# Patient Record
Sex: Female | Born: 1955 | Race: White | Hispanic: No | Marital: Married | Smoking: Never smoker
Health system: Southern US, Community
[De-identification: ages and names within clinical notes are randomized; demographics above are authoritative.]

## PROBLEM LIST (undated history)

## (undated) DIAGNOSIS — I739 Peripheral vascular disease, unspecified: Secondary | ICD-10-CM

## (undated) DIAGNOSIS — I10 Essential (primary) hypertension: Secondary | ICD-10-CM

## (undated) DIAGNOSIS — G4733 Obstructive sleep apnea (adult) (pediatric): Secondary | ICD-10-CM

## (undated) DIAGNOSIS — C4490 Unspecified malignant neoplasm of skin, unspecified: Secondary | ICD-10-CM

## (undated) DIAGNOSIS — E669 Obesity, unspecified: Secondary | ICD-10-CM

## (undated) DIAGNOSIS — M1990 Unspecified osteoarthritis, unspecified site: Secondary | ICD-10-CM

## (undated) DIAGNOSIS — F419 Anxiety disorder, unspecified: Secondary | ICD-10-CM

## (undated) DIAGNOSIS — I499 Cardiac arrhythmia, unspecified: Secondary | ICD-10-CM

## (undated) DIAGNOSIS — I480 Paroxysmal atrial fibrillation: Secondary | ICD-10-CM

## (undated) DIAGNOSIS — K219 Gastro-esophageal reflux disease without esophagitis: Secondary | ICD-10-CM

## (undated) DIAGNOSIS — I4891 Unspecified atrial fibrillation: Secondary | ICD-10-CM

## (undated) DIAGNOSIS — D649 Anemia, unspecified: Secondary | ICD-10-CM

## (undated) DIAGNOSIS — G43909 Migraine, unspecified, not intractable, without status migrainosus: Secondary | ICD-10-CM

## (undated) DIAGNOSIS — Z9989 Dependence on other enabling machines and devices: Secondary | ICD-10-CM

## (undated) HISTORY — PX: TUBAL LIGATION: SHX77

## (undated) HISTORY — DX: Essential (primary) hypertension: I10

## (undated) HISTORY — DX: Obesity, unspecified: E66.9

## (undated) HISTORY — PX: TONSILLECTOMY AND ADENOIDECTOMY: SUR1326

## (undated) HISTORY — DX: Anxiety disorder, unspecified: F41.9

## (undated) HISTORY — DX: Unspecified atrial fibrillation: I48.91

## (undated) HISTORY — PX: JOINT REPLACEMENT: SHX530

## (undated) HISTORY — DX: Paroxysmal atrial fibrillation: I48.0

---

## 1998-05-08 ENCOUNTER — Ambulatory Visit (HOSPITAL_COMMUNITY): Admission: RE | Admit: 1998-05-08 | Discharge: 1998-05-08 | Payer: Self-pay | Admitting: Family Medicine

## 1998-05-14 ENCOUNTER — Encounter: Payer: Self-pay | Admitting: Family Medicine

## 1998-05-14 ENCOUNTER — Ambulatory Visit (HOSPITAL_COMMUNITY): Admission: RE | Admit: 1998-05-14 | Discharge: 1998-05-14 | Payer: Self-pay | Admitting: Family Medicine

## 1998-11-11 ENCOUNTER — Ambulatory Visit (HOSPITAL_COMMUNITY): Admission: RE | Admit: 1998-11-11 | Discharge: 1998-11-11 | Payer: Self-pay | Admitting: Family Medicine

## 1999-03-09 ENCOUNTER — Other Ambulatory Visit: Admission: RE | Admit: 1999-03-09 | Discharge: 1999-03-09 | Payer: Self-pay | Admitting: Family Medicine

## 2000-01-14 ENCOUNTER — Encounter: Payer: Self-pay | Admitting: Family Medicine

## 2000-01-14 ENCOUNTER — Encounter: Admission: RE | Admit: 2000-01-14 | Discharge: 2000-01-14 | Payer: Self-pay | Admitting: Family Medicine

## 2000-10-07 ENCOUNTER — Other Ambulatory Visit: Admission: RE | Admit: 2000-10-07 | Discharge: 2000-10-07 | Payer: Self-pay | Admitting: *Deleted

## 2000-10-13 ENCOUNTER — Encounter: Admission: RE | Admit: 2000-10-13 | Discharge: 2000-10-13 | Payer: Self-pay | Admitting: *Deleted

## 2000-10-13 ENCOUNTER — Encounter: Payer: Self-pay | Admitting: *Deleted

## 2003-06-29 HISTORY — PX: CARDIAC CATHETERIZATION: SHX172

## 2003-08-05 ENCOUNTER — Inpatient Hospital Stay (HOSPITAL_COMMUNITY): Admission: EM | Admit: 2003-08-05 | Discharge: 2003-08-05 | Payer: Self-pay | Admitting: *Deleted

## 2003-09-30 ENCOUNTER — Other Ambulatory Visit: Admission: RE | Admit: 2003-09-30 | Discharge: 2003-09-30 | Payer: Self-pay | Admitting: Obstetrics and Gynecology

## 2003-10-02 ENCOUNTER — Encounter: Admission: RE | Admit: 2003-10-02 | Discharge: 2003-10-02 | Payer: Self-pay | Admitting: Obstetrics and Gynecology

## 2004-11-05 ENCOUNTER — Encounter: Admission: RE | Admit: 2004-11-05 | Discharge: 2004-11-05 | Payer: Self-pay | Admitting: Obstetrics and Gynecology

## 2004-12-15 ENCOUNTER — Ambulatory Visit: Payer: Self-pay | Admitting: Internal Medicine

## 2005-01-01 ENCOUNTER — Ambulatory Visit: Payer: Self-pay | Admitting: Internal Medicine

## 2005-12-16 ENCOUNTER — Encounter: Admission: RE | Admit: 2005-12-16 | Discharge: 2005-12-16 | Payer: Self-pay | Admitting: Obstetrics and Gynecology

## 2006-07-25 ENCOUNTER — Encounter: Admission: RE | Admit: 2006-07-25 | Discharge: 2006-07-25 | Payer: Self-pay | Admitting: Obstetrics and Gynecology

## 2007-01-06 ENCOUNTER — Encounter: Admission: RE | Admit: 2007-01-06 | Discharge: 2007-01-06 | Payer: Self-pay | Admitting: Obstetrics and Gynecology

## 2007-01-17 ENCOUNTER — Encounter: Admission: RE | Admit: 2007-01-17 | Discharge: 2007-01-17 | Payer: Self-pay | Admitting: Obstetrics and Gynecology

## 2008-01-22 ENCOUNTER — Encounter: Admission: RE | Admit: 2008-01-22 | Discharge: 2008-01-22 | Payer: Self-pay | Admitting: Obstetrics and Gynecology

## 2008-07-24 ENCOUNTER — Ambulatory Visit (HOSPITAL_COMMUNITY): Admission: RE | Admit: 2008-07-24 | Discharge: 2008-07-24 | Payer: Self-pay | Admitting: Interventional Cardiology

## 2009-02-13 ENCOUNTER — Encounter: Admission: RE | Admit: 2009-02-13 | Discharge: 2009-02-13 | Payer: Self-pay | Admitting: Obstetrics and Gynecology

## 2009-04-24 ENCOUNTER — Ambulatory Visit (HOSPITAL_COMMUNITY): Admission: RE | Admit: 2009-04-24 | Discharge: 2009-04-24 | Payer: Self-pay | Admitting: Interventional Cardiology

## 2009-08-31 ENCOUNTER — Emergency Department (HOSPITAL_COMMUNITY): Admission: EM | Admit: 2009-08-31 | Discharge: 2009-08-31 | Payer: Self-pay | Admitting: Emergency Medicine

## 2010-02-16 ENCOUNTER — Encounter: Admission: RE | Admit: 2010-02-16 | Discharge: 2010-02-16 | Payer: Self-pay | Admitting: Obstetrics and Gynecology

## 2010-09-20 LAB — URINALYSIS, ROUTINE W REFLEX MICROSCOPIC
Bilirubin Urine: NEGATIVE
Glucose, UA: NEGATIVE mg/dL
Hgb urine dipstick: NEGATIVE
Ketones, ur: NEGATIVE mg/dL
Nitrite: NEGATIVE
Protein, ur: NEGATIVE mg/dL
Specific Gravity, Urine: 1.026 (ref 1.005–1.030)
Urobilinogen, UA: 1 mg/dL (ref 0.0–1.0)
pH: 6 (ref 5.0–8.0)

## 2010-09-20 LAB — CBC
HCT: 43.1 % (ref 36.0–46.0)
Hemoglobin: 14.3 g/dL (ref 12.0–15.0)
MCHC: 33.3 g/dL (ref 30.0–36.0)
MCV: 89.5 fL (ref 78.0–100.0)
Platelets: 286 K/uL (ref 150–400)
RBC: 4.81 MIL/uL (ref 3.87–5.11)
RDW: 14.7 % (ref 11.5–15.5)
WBC: 9.2 K/uL (ref 4.0–10.5)

## 2010-09-20 LAB — BASIC METABOLIC PANEL WITH GFR
BUN: 13 mg/dL (ref 6–23)
CO2: 23 meq/L (ref 19–32)
Calcium: 8.5 mg/dL (ref 8.4–10.5)
Chloride: 103 meq/L (ref 96–112)
Creatinine, Ser: 0.79 mg/dL (ref 0.4–1.2)
GFR calc non Af Amer: >60 mL/min
Glucose, Bld: 119 mg/dL — ABNORMAL HIGH (ref 70–99)
Potassium: 4.8 meq/L (ref 3.5–5.1)
Sodium: 136 meq/L (ref 135–145)

## 2010-09-20 LAB — DIFFERENTIAL
Eosinophils Absolute: 0.1 10*3/uL (ref 0.0–0.7)
Lymphocytes Relative: 16 % (ref 12–46)

## 2010-10-01 LAB — PROTIME-INR: Prothrombin Time: 25 seconds — ABNORMAL HIGH (ref 11.6–15.2)

## 2010-11-13 NOTE — Cardiovascular Report (Signed)
NAMECARMIE, Deborah [name]                           ACCOUNT NO.:  [id number]   MEDICAL RECORD NO.:  [id number]                   PATIENT TYPE:  INP   LOCATION:  2861                                 FACILITY:  [facility]   PHYSICIAN:  Veneda Melter, M.D.                   DATE OF BIRTH:  [birth year]   DATE OF PROCEDURE:  08/05/2003  DATE OF DISCHARGE:  08/05/2003                              CARDIAC CATHETERIZATION   PROCEDURES PERFORMED:  1. Left heart catheterization.  2. Left ventriculogram.  3. Selective coronary angiography.  4. Perclose right femoral artery.   DIAGNOSES:  1. Trivial coronary artery disease by angiogram.  2. Normal left ventricular size and function.   HISTORY:  Deborah [name] is a 55 year old white female without prior cardiac  history but with a strong family history of coronary disease who presents  with severe substernal chest discomfort.  The patient presented to the  hospital.  He was stabilized medically and is referred for further  assessment.   DESCRIPTION OF PROCEDURE:  Informed consent was obtained.  The patient was  brought to the catheterization lab.  A 6 French sheath was placed in the  right femoral artery using the modified Seldinger technique.  A 6 [place]  and JR4 catheter was used to engage the left coronary artery and selective  angiography was performed in various projections using manual injections of  contrast.  A 6 French pigtail catheter was then advanced to the left  ventricle and a ventriculogram performed in two views using power injections  of contrast.  At the termination of the case, catheters and sheath were  removed.  New Perclose system to employed to the right femoral artery.  Mild  oozing was noted, and further manual pressure applied until hemostasis was  achieved.  The patient tolerated the procedure well and was transferred to  the floor in stable condition.   FINDINGS:  1. Left main trunk:  Large caliber vessel which is  angiographically normal.  2. Left anterior descending is a large caliber vessel that provides a first     diagonal branch in the proximal segment and two smaller diagonal branches     in the midsection.  The LAD has luminal irregularities.  3. Left circumflex artery is a small caliber vessel that provides two     marginal branches.  The left circumflex system has luminal     irregularities.  4. Right coronary artery is dominant.  It is a large caliber vessel that     provides the posterior descending artery and posterior ventricular branch     terminal segment.  The right coronary has luminal irregularities of 10-     15% in the proximal segment.   LEFT VENTRICULOGRAM:  Normal end-systolic and end-diastolic dimensions.  Overall left ventricular function is well-preserved with an ejection  fraction of greater than 55%.  No mitral regurgitation.  The LV pressure is  130/10.  Aortic is 130/80.  LVEDP equal 20.   ASSESSMENT AND PLAN:  Deborah [name] is a 55 year old female with trivial  coronary artery disease and well preserved left ventricular function.  Continued medical therapy will be pursued.  She was then started on beta  blocker for treatment of her blood pressure and this will be followed  closely in the office and further adjustments made as necessary.  Should she  have continued discomfort, other causes of chest pain will be investigated.                                               Veneda Melter, M.D.    NG/MEDQ  D:  08/05/2003  T:  08/06/2003  Job:  [id number]   cc:   [place]. Andrey Campanile, M.D.  [address]  [place]  [place] [address]  Fax: [phone]   Rollene Rotunda, M.D.

## 2010-11-13 NOTE — H&P (Signed)
[name], [name] NO.:  [id number]   MEDICAL RECORD NO.:  [id number]                   PATIENT TYPE:  EMS   LOCATION:  MAJO                                 FACILITY:  [facility]   PHYSICIAN:  Rollene Rotunda, M.D.                DATE OF BIRTH:  1956/01/27   DATE OF ADMISSION:  08/05/2003  DATE OF DISCHARGE:                                HISTORY & PHYSICAL   REASON FOR ADMISSION:  Patient with chest pain.   HISTORY OF PRESENT ILLNESS:  The patient is a pleasant 55 year old white  female without prior cardiac history.  She was at [facility] yesterday when she  developed substernal chest pressure.  It was moderately severe in intensity.  It radiated to right side of her neck.  There was no associated diaphoresis  or nausea.  She had not had this kind of pain before.  In particularly, it  was not like previous reflux.  It lasted for approximately 45 minutes and  went away on its own.  She did feel some slight discomfort throughout the  rest of the evening.  She woke in the early a.m. with chest discomfort.  This lasted for only 10 minutes.  It was not as severe.   In retrospect, she has not had this kind of discomfort before.  She is  moderately active.  She does not exercise.  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  With this, she denies any chest discomfort, neck discomfort,  arm discomfort, activity induced nausea, vomiting, or excessive diaphoresis.  She has had no palpitations, presyncope or syncope.  She denies any PND or  orthopnea.   PAST MEDICAL HISTORY:  The patient reports no history of hyperlipidemia,  though she does not recall the readings, she has had mild hypertension  yesterday at Urgent Care and today.  She has never had diabetes.   PAST SURGICAL HISTORY:  1. Tonsillectomy.  2. Tubal ligation.   ALLERGIES:  No known drug allergies.   MEDICATIONS:   None.   SOCIAL HISTORY:  The patient is married.  She has two children ages 76 and  33.  She has never smoked cigarettes.  She rarely drinks alcohol.   FAMILY HISTORY:  This is contributory for mother dying of myocardial  infarction at age 49.  She also has a brother with questionable MI in his  55s.   REVIEW OF SYMPTOMS:  As stated in the HPI.  Positive for recent urinary  tract infection, positive for fatigue for the last week, unusual for her.   PHYSICAL EXAMINATION:  GENERAL APPEARANCE:  The patient is in no distress.  VITAL SIGNS:  Blood pressure 124/70, heart rate 77 and regular.  HEENT:  Eyelids unremarkable.  Pupils are equal, round and reactive to  light.  Fundi not visualized.  Oral  mucosa  unremarkable.  NECK:  No jugular venous distension.  Wave form within normal limits.  Carotid upstroke brisk and symmetric, no bruits, no thyromegaly.  LYMPHATICS:  No cervical, axillary, or inguinal adenopathy.  CHEST:  Unremarkable.  LUNGS:  Clear to auscultation bilaterally.  BACK:  No costovertebral angle tenderness.  CARDIOVASCULAR:  PMI not displaced or sustained.  S1 and S2 within normal  limits, no S3, no S4, no murmurs.  ABDOMEN:  Flat, positive bowel sounds, normal in frequency and pitch.  No  bruits, rebound, guarding, no midline pulsatile mass, no organomegaly.  SKIN:  No rashes, no nodules.  EXTREMITIES:  2+ pulses throughout, no edema.  NEUROLOGIC:  Oriented to person, place and time.  Cranial nerves II-XII  grossly intact.  Motor grossly intact.   EKG sinus rhythm.  Axis within normal limits.  Intervals within normal  limits.  No acute STT wave changes.   Chest x-ray pending.   Labs pending.   ASSESSMENT/PLAN:  1. Chest discomfort:  The patient's chest discomfort has some typical     features.  She does have a family history of early onset coronary     disease.  Given the resting nature of this and the recurrence, we will     assume that this represents unstable  angina.  I have discussed this at     great length with the patient and her husband.  I have discussed the     possibility of stress testing with Cardiolite versus cardiac     catheterization.  I have described in great detail the risks and benefits     of each approach.  I would suggest cardiac catheterization as I think her     pretest probability of obstructive coronary disease is moderate.  The     patient and her husband the risks and benefits and wish to proceed in     this direction.  2. Risk reduction:  Would check a fasting lipid profile.  3. Hypertension:  Blood pressure is mildly elevated.  Will treat this in the     context of any obstructive coronary disease.  It might be identified.     Otherwise it might be treated with therapeutic lifestyle changes.  She     will be followed long-term by Dr. Andrey Campanile.                                                Rollene Rotunda, M.D.    JH/MEDQ  D:  08/05/2003  T:  08/05/2003  Job:  [id number]   cc:   [place]. Andrey Campanile, M.D.  [address]  [place]  [place] [address]  Fax: [phone]

## 2010-11-13 NOTE — Discharge Summary (Signed)
NAMEBETHANN, Deborah [name] NO.:  [id number]   MEDICAL RECORD NO.:  [id number]                   PATIENT TYPE:  INP   LOCATION:  2861                                 FACILITY:  [facility]   PHYSICIAN:  Rollene Rotunda, M.D.                DATE OF BIRTH:  Jun 02, 1956   DATE OF ADMISSION:  08/05/2003  DATE OF DISCHARGE:  08/05/2003                                 DISCHARGE SUMMARY   PROCEDURES:  1. Cardiac catheterization.  2. Coronary arteriogram.  3. Left ventriculogram.   HOSPITAL COURSE:  Deborah [name] is a 55 year old female with no known history of  coronary artery disease.  She developed moderately severe substernal chest  discomfort that radiated to her neck on the right while in a store with  minimal exertion.  This lasted about 45 minutes.  She came to the emergency  room where she was admitted for further evaluation and treatment.   Deborah [name] has cardiac risk factors and it was felt that she needed further  evaluation with catheterization and this was performed on August 05, 2003.  The cardiac catheterization showed less than 20% stenosis of the RCA and no  other disease except for some minimal irregularities.  EF was greater than  55% with no MR.  Dr. Chales Abrahams evaluated the films and felt that she had trivial  coronary artery disease and medical management was the best option.  He  added a beta-blocker to better control her blood pressure and she is to  follow up with her primary care physician.   Post catheterization, she had no chest pain or shortness of breath and her  groin was stable.  She was discharged on August 05, 2003 and is to follow  up with Dr. Andrey Campanile as an outpatient.   CONDITION ON DISCHARGE:  Improved.   DISCHARGE DIAGNOSES:  1. Chest pain, no significant coronary artery disease by catheterization     with normal ejection fraction.  2. Possible history of hyperlipidemia.  3. Borderline hypertension.  4. Status post tubal ligation.  5.  Status post tonsillectomy and adenoidectomy.  6. Family history of coronary artery disease.  7. History of recent fatigue.   ACTIVITY:  No strenuous activity for the next 24 hours.   FOLLOW UP:  She is to follow up with Dr. Margrett Rud.  She is to follow  up with cardiology on a p.r.n. basis.   DISCHARGE MEDICATIONS:  Lopressor 25 mg p.o. b.i.d.      Theodore Demark, P.A. [facility]                  Rollene Rotunda, M.D.    RB/MEDQ  D:  08/05/2003  T:  08/06/2003  Job:  [id number]   cc:   [place]. Andrey Campanile, M.D.  [address]  [place]  [place] [address]  Fax: [phone]

## 2011-02-16 ENCOUNTER — Other Ambulatory Visit: Payer: Self-pay | Admitting: Obstetrics

## 2011-02-16 DIAGNOSIS — Z1231 Encounter for screening mammogram for malignant neoplasm of breast: Secondary | ICD-10-CM

## 2011-02-25 ENCOUNTER — Ambulatory Visit
Admission: RE | Admit: 2011-02-25 | Discharge: 2011-02-25 | Disposition: A | Discharge status: Home / Self Care | Payer: 59 | Source: Ambulatory Visit | Attending: Obstetrics | Admitting: Obstetrics

## 2011-02-25 DIAGNOSIS — Z1231 Encounter for screening mammogram for malignant neoplasm of breast: Secondary | ICD-10-CM

## 2011-09-24 DIAGNOSIS — H35719 Central serous chorioretinopathy, unspecified eye: Secondary | ICD-10-CM | POA: Insufficient documentation

## 2011-09-24 DIAGNOSIS — H35052 Retinal neovascularization, unspecified, left eye: Secondary | ICD-10-CM | POA: Insufficient documentation

## 2012-03-10 ENCOUNTER — Other Ambulatory Visit: Payer: Self-pay | Admitting: Obstetrics

## 2012-03-10 DIAGNOSIS — Z1231 Encounter for screening mammogram for malignant neoplasm of breast: Secondary | ICD-10-CM

## 2012-03-31 DIAGNOSIS — Z79899 Other long term (current) drug therapy: Secondary | ICD-10-CM | POA: Insufficient documentation

## 2012-03-31 DIAGNOSIS — H318 Other specified disorders of choroid: Secondary | ICD-10-CM | POA: Insufficient documentation

## 2012-04-14 ENCOUNTER — Ambulatory Visit
Admission: RE | Admit: 2012-04-14 | Discharge: 2012-04-14 | Disposition: A | Discharge status: Home / Self Care | Payer: 59 | Source: Ambulatory Visit | Attending: Obstetrics | Admitting: Obstetrics

## 2012-04-14 DIAGNOSIS — Z1231 Encounter for screening mammogram for malignant neoplasm of breast: Secondary | ICD-10-CM

## 2012-12-19 DIAGNOSIS — H3581 Retinal edema: Secondary | ICD-10-CM | POA: Insufficient documentation

## 2013-04-02 ENCOUNTER — Other Ambulatory Visit: Payer: Self-pay

## 2013-04-02 DIAGNOSIS — Z1231 Encounter for screening mammogram for malignant neoplasm of breast: Secondary | ICD-10-CM

## 2013-04-24 ENCOUNTER — Ambulatory Visit
Admission: RE | Admit: 2013-04-24 | Discharge: 2013-04-24 | Disposition: A | Discharge status: Home / Self Care | Payer: 59 | Source: Ambulatory Visit

## 2013-04-24 DIAGNOSIS — Z1231 Encounter for screening mammogram for malignant neoplasm of breast: Secondary | ICD-10-CM

## 2013-05-02 ENCOUNTER — Encounter: Payer: Self-pay | Admitting: Interventional Cardiology

## 2013-05-02 ENCOUNTER — Encounter: Payer: Self-pay | Admitting: *Deleted

## 2013-05-03 ENCOUNTER — Encounter: Payer: Self-pay | Admitting: Cardiology

## 2013-05-03 ENCOUNTER — Ambulatory Visit (INDEPENDENT_AMBULATORY_CARE_PROVIDER_SITE_OTHER): Payer: 59 | Admitting: Cardiology

## 2013-05-03 VITALS — BP 130/84 | HR 60 | Ht 74.0 in | Wt 281.0 lb

## 2013-05-03 DIAGNOSIS — I10 Essential (primary) hypertension: Secondary | ICD-10-CM | POA: Insufficient documentation

## 2013-05-03 DIAGNOSIS — I4891 Unspecified atrial fibrillation: Secondary | ICD-10-CM

## 2013-05-03 DIAGNOSIS — G4733 Obstructive sleep apnea (adult) (pediatric): Secondary | ICD-10-CM

## 2013-05-03 DIAGNOSIS — E669 Obesity, unspecified: Secondary | ICD-10-CM | POA: Insufficient documentation

## 2013-05-03 DIAGNOSIS — I480 Paroxysmal atrial fibrillation: Secondary | ICD-10-CM

## 2013-05-03 NOTE — Progress Notes (Signed)
[address] [place], [place]  [address] Phone: [phone] Fax:  [phone]  Date:  05/03/2013   ID:  Deborah [name], DOB 03-27-1956, MRN [id number]  PCP:  No primary provider on file.  Sleep Medicine: Armanda Magic, MD     History of Present Illness: Deborah [name] is a 57 y.o. female with a history of severe OSA, obesity, HTN and PAF presents today for followup.  She is doing well.  She tolerates her CPAP device well.  She denies any daytime sleepiness except  and feels refreshed when she gets up.  She tolerates her nasal mask and feels the pressure is adequate.  She denies any palpitations, dizziness or syncope.  She does not get any aerobic exercise.   Wt Readings from Last 3 Encounters:  No data found for Wt     Past Medical History  Diagnosis Date  . Obesity   . Anxiety   . OSA (obstructive sleep apnea)   . A-fib   . HTN (hypertension)   . PAF (paroxysmal atrial fibrillation)     s/p DCCV 2010    Current Outpatient Prescriptions  Medication Sig Dispense Refill  . ALPRAZolam (XANAX) 0.5 MG tablet Take 0.5 mg by mouth at bedtime as needed for anxiety.      Marland Kitchen aspirin 325 MG EC tablet Take 325 mg by mouth daily.      Marland Kitchen escitalopram (LEXAPRO) 20 MG tablet Take 20 mg by mouth daily.       . flecainide (TAMBOCOR) 100 MG tablet Take 100 mg by mouth 2 (two) times daily.      . folic acid (FOLVITE) 1 MG tablet Take 1 mg by mouth daily.       . methotrexate (RHEUMATREX) 2.5 MG tablet Take 7.5 mg by mouth 4 (four) times a week.      . metoprolol succinate (TOPROL-XL) 100 MG 24 hr tablet Take 50 mg by mouth daily. Take with or immediately following a meal.       No current facility-administered medications for this visit.    Allergies:    Allergies  Allergen Reactions  . Adhesive [Tape]   . Benazepril     cough,  . Sulfa Antibiotics     Social History:  The patient  reports that she has never smoked. She does not have any smokeless tobacco history on file.  She reports that she drinks alcohol. She reports that she does not use illicit drugs.   Family History:  The patient's family history is not on file.   ROS:  Please see the history of present illness.      All other systems reviewed and negative.   PHYSICAL EXAM: VS:  There were no vitals taken for this visit. Well nourished, well developed, in no acute distress HEENT: normal Neck: no JVD Cardiac:  normal S1, S2; RRR; no murmur Lungs:  clear to auscultation bilaterally, no wheezing, rhonchi or rales Abd: soft, nontender, no hepatomegaly Ext: no edema Skin: warm and dry Neuro:  CNs 2-12 intact, no focal abnormalities noted       ASSESSMENT AND PLAN:  1. OSA on CPAP.  Her download today showed an AHI of 3.1/hr and 96% compliance in using more than 4 hours nightly.  She will continue on current settings. 2. HTN - well controlled  - continue Metoprolol 3.  PAF with no reoccurence since DCCV  -Flecainide/metoprolol/ASA 4.  Obesity- I recommended that she start getting into a routine exercise  regimen  Followup with me in 6 months  Signed, Armanda Magic, MD 05/03/2013 2:27 PM

## 2013-05-03 NOTE — Patient Instructions (Signed)
Your physician wants you to follow-up in: 6 Months with Dr. Turner You will receive a reminder letter in the mail two months in advance. If you don't receive a letter, please call our office to schedule the follow-up appointment.  

## 2014-01-03 ENCOUNTER — Encounter: Payer: Self-pay | Admitting: Interventional Cardiology

## 2014-01-03 ENCOUNTER — Ambulatory Visit (INDEPENDENT_AMBULATORY_CARE_PROVIDER_SITE_OTHER): Payer: 59 | Admitting: Interventional Cardiology

## 2014-01-03 VITALS — BP 122/80 | HR 61 | Ht 74.0 in | Wt 292.4 lb

## 2014-01-03 DIAGNOSIS — I4891 Unspecified atrial fibrillation: Secondary | ICD-10-CM

## 2014-01-03 DIAGNOSIS — I480 Paroxysmal atrial fibrillation: Secondary | ICD-10-CM

## 2014-01-03 DIAGNOSIS — E669 Obesity, unspecified: Secondary | ICD-10-CM

## 2014-01-03 DIAGNOSIS — I10 Essential (primary) hypertension: Secondary | ICD-10-CM

## 2014-01-03 NOTE — Patient Instructions (Signed)
Your physician recommends that you continue on your current medications as directed. Please refer to the Current Medication list given to you today.  Your physician wants you to follow-up in: [place] DR. VARANASI. You will receive a reminder letter in the mail two months in advance. If you don't receive a letter, please call our office to schedule the follow-up appointment.

## 2014-01-03 NOTE — Progress Notes (Signed)
Patient ID: Deborah [name], female   DOB: 11-30-1955, 58 y.o.   MRN: [id number]    [facility], [place], [place]  [address] Phone: [phone] Fax:  [phone]  Date:  01/03/2014   ID:  Deborah [name], DOB 15-Sep-1955, MRN [id number]  PCP:  No primary provider on file.      History of Present Illness: Deborah [name] is a 58 y.o. female with PAF. Tolerating meds. Exercising more.   Denies : Chest pain.  Dizziness.  Leg edema.  Orthopnea.  Palpitations.  Syncope.    Wt Readings from Last 3 Encounters:  01/03/14 292 lb 6.4 oz (132.632 kg)  05/03/13 281 lb (127.461 kg)     Past Medical History  Diagnosis Date  . Obesity   . Anxiety   . OSA (obstructive sleep apnea)   . A-fib   . HTN (hypertension)   . PAF (paroxysmal atrial fibrillation)     s/p DCCV 2010    Current Outpatient Prescriptions  Medication Sig Dispense Refill  . ALPRAZolam (XANAX) 0.5 MG tablet Take 0.5 mg by mouth at bedtime as needed for anxiety.      Marland Kitchen aspirin 325 MG EC tablet Take 325 mg by mouth daily.      Marland Kitchen escitalopram (LEXAPRO) 20 MG tablet Take 20 mg by mouth daily.       . flecainide (TAMBOCOR) 100 MG tablet Take 100 mg by mouth 2 (two) times daily.      . folic acid (FOLVITE) 1 MG tablet Take 1 mg by mouth daily.       . methotrexate (RHEUMATREX) 2.5 MG tablet Take 7.5 mg by mouth 4 (four) times a week.      . metoprolol succinate (TOPROL-XL) 100 MG 24 hr tablet Take 50 mg by mouth daily. Take with or immediately following a meal.       No current facility-administered medications for this visit.    Allergies:    Allergies  Allergen Reactions  . Adhesive [Tape]   . Benazepril     cough,  . Sulfa Antibiotics     Social History:  The patient  reports that she has never smoked. She does not have any smokeless tobacco history on file. She reports that she drinks alcohol. She reports that she does not use illicit drugs.   Family History:  The patient's family history includes  Heart disease in her mother.   ROS:  Please see the history of present illness.  No nausea, vomiting.  No fevers, chills.  No focal weakness.  No dysuria.   All other systems reviewed and negative.   PHYSICAL EXAM: VS:  BP 122/80  Pulse 61  Ht 6\' 2"  (1.88 m)  Wt 292 lb 6.4 oz (132.632 kg)  BMI 37.53 kg/m2 Well nourished, well developed, in no acute distress HEENT: normal Neck: no JVD, no carotid bruits Cardiac:  normal S1, S2; RRR;  Lungs:  clear to auscultation bilaterally, no wheezing, rhonchi or rales Abd: soft, nontender, no hepatomegaly Ext: no edema Skin: warm and dry Neuro:   no focal abnormalities noted  EKG:     NSR, RBBB, PVC  ASSESSMENT AND PLAN:  Atrial fibrillation  Continue Flecainide Acetate Tablet, 100 MG, 1 tablet, Orally, every 12 hrs Continue Aspirin EC Tablet Delayed Release, 325 MG, 1 tablet, Orally, once a day       Notes: Maintaining NSR. Feels PVCs but nothing like AFib.    2. Hypertension, essential  Continue  Metoprolol Succinate Tablet Extended Release 24 Hour, 100 MG, 1/2 tablet, Once a day Notes: Controlled.    3. Obesity (BMI 30-39.9)  Notes: Spoke about the importance of weight loss. SHe will try to exercise and watch her diet. Her whole family was trying to lose weight.    4. Obstructive sleep apnea  Notes: Significant sleep apnea. She feels much better with CPAP.    Preventive Medicine  Adult topics discussed:  Diet: healthy diet.  Exercise: 5 days a week, at least 30 minutes of aerobic exercise.      Signed, Mina Marble, MD, [facility] 01/03/2014 4:47 PM

## 2014-01-08 ENCOUNTER — Other Ambulatory Visit: Payer: Self-pay | Admitting: *Deleted

## 2014-01-08 MED ORDER — FLECAINIDE ACETATE 100 MG PO TABS: 100.0000 mg | ORAL_TABLET | Freq: Two times a day (BID) | ORAL | Status: DC

## 2014-01-22 ENCOUNTER — Ambulatory Visit: Payer: 59 | Admitting: Interventional Cardiology

## 2014-01-25 ENCOUNTER — Ambulatory Visit (HOSPITAL_COMMUNITY)
Admission: RE | Admit: 2014-01-25 | Discharge: 2014-01-25 | Disposition: A | Discharge status: Home / Self Care | Payer: 59 | Source: Ambulatory Visit | Attending: Family Medicine | Admitting: Family Medicine

## 2014-01-25 ENCOUNTER — Other Ambulatory Visit (HOSPITAL_COMMUNITY): Payer: Self-pay | Admitting: Family Medicine

## 2014-01-25 DIAGNOSIS — M79609 Pain in unspecified limb: Secondary | ICD-10-CM

## 2014-01-25 DIAGNOSIS — I8000 Phlebitis and thrombophlebitis of superficial vessels of unspecified lower extremity: Secondary | ICD-10-CM | POA: Insufficient documentation

## 2014-01-25 DIAGNOSIS — M79605 Pain in left leg: Secondary | ICD-10-CM

## 2014-01-25 NOTE — Progress Notes (Signed)
VASCULAR LAB PRELIMINARY  PRELIMINARY  PRELIMINARY  PRELIMINARY  Left lower extremity venous duplex completed.    Preliminary report:  Left:  Superficial thrombosis noted in the lesser saphenous vein.  No evidence of deep vein thrombosis.     Ceylin Dreibelbis, RVT 01/25/2014, 3:24 PM

## 2014-04-03 ENCOUNTER — Other Ambulatory Visit: Payer: Self-pay

## 2014-04-03 DIAGNOSIS — Z1231 Encounter for screening mammogram for malignant neoplasm of breast: Secondary | ICD-10-CM

## 2014-04-18 ENCOUNTER — Other Ambulatory Visit: Payer: Self-pay

## 2014-04-18 DIAGNOSIS — G4730 Sleep apnea, unspecified: Secondary | ICD-10-CM

## 2014-04-26 ENCOUNTER — Ambulatory Visit
Admission: RE | Admit: 2014-04-26 | Discharge: 2014-04-26 | Disposition: A | Discharge status: Home / Self Care | Payer: 59 | Source: Ambulatory Visit

## 2014-04-26 ENCOUNTER — Ambulatory Visit: Payer: 59

## 2014-04-26 DIAGNOSIS — Z1231 Encounter for screening mammogram for malignant neoplasm of breast: Secondary | ICD-10-CM

## 2014-06-28 DIAGNOSIS — I739 Peripheral vascular disease, unspecified: Secondary | ICD-10-CM

## 2014-06-28 HISTORY — PX: CATARACT EXTRACTION W/ INTRAOCULAR LENS IMPLANT: SHX1309

## 2014-06-28 HISTORY — DX: Peripheral vascular disease, unspecified: I73.9

## 2014-10-24 ENCOUNTER — Encounter: Payer: Self-pay | Admitting: Internal Medicine

## 2014-11-05 DIAGNOSIS — IMO0002 Reserved for concepts with insufficient information to code with codable children: Secondary | ICD-10-CM | POA: Insufficient documentation

## 2014-12-05 ENCOUNTER — Encounter: Payer: Self-pay | Admitting: Internal Medicine

## 2014-12-17 ENCOUNTER — Other Ambulatory Visit: Payer: Self-pay | Admitting: Interventional Cardiology

## 2015-01-27 ENCOUNTER — Encounter: Payer: Self-pay | Admitting: Gastroenterology

## 2015-02-10 DIAGNOSIS — G4733 Obstructive sleep apnea (adult) (pediatric): Secondary | ICD-10-CM | POA: Insufficient documentation

## 2015-02-10 DIAGNOSIS — I480 Paroxysmal atrial fibrillation: Secondary | ICD-10-CM | POA: Insufficient documentation

## 2015-02-18 ENCOUNTER — Encounter: Payer: Self-pay | Admitting: Internal Medicine

## 2015-02-20 ENCOUNTER — Encounter: Payer: Self-pay | Admitting: Interventional Cardiology

## 2015-02-20 ENCOUNTER — Ambulatory Visit (INDEPENDENT_AMBULATORY_CARE_PROVIDER_SITE_OTHER): Payer: 59 | Admitting: Interventional Cardiology

## 2015-02-20 VITALS — BP 140/90 | HR 56 | Ht 73.0 in | Wt 297.8 lb

## 2015-02-20 DIAGNOSIS — G4733 Obstructive sleep apnea (adult) (pediatric): Secondary | ICD-10-CM

## 2015-02-20 DIAGNOSIS — I480 Paroxysmal atrial fibrillation: Secondary | ICD-10-CM | POA: Diagnosis not present

## 2015-02-20 DIAGNOSIS — I10 Essential (primary) hypertension: Secondary | ICD-10-CM | POA: Diagnosis not present

## 2015-02-20 NOTE — Progress Notes (Signed)
Patient ID: Deborah [name], female   DOB: 01/02/1956, 59 y.o.   MRN: [id number]     Cardiology Office Note   Date:  02/20/2015   ID:  Deborah [name], DOB [birth year], MRN [id number]  PCP:  Simona Huh, MD    No chief complaint on file. f/u AFib   Wt Readings from Last 3 Encounters:  02/20/15 297 lb 12.8 oz (135.081 kg)  01/03/14 292 lb 6.4 oz (132.632 kg)  05/03/13 281 lb (127.461 kg)       History of Present Illness: Deborah [name] is a 59 y.o. female  with PAF. Tolerating meds. Exercising less due to knee pain.  It resolved on its own typically.  Denies : Chest pain.  Dizziness.  Leg edema.  Orthopnea.  Palpitations.  Syncope.  "Loves" her CPAP.  Uses it even for naps.  No bleeding problems.  Past Medical History  Diagnosis Date  . Obesity   . Anxiety   . OSA (obstructive sleep apnea)   . A-fib   . HTN (hypertension)   . PAF (paroxysmal atrial fibrillation)     s/p DCCV 2010    Past Surgical History  Procedure Laterality Date  . Btl    . Tonsillectomy and adenoidectomy       Current Outpatient Prescriptions  Medication Sig Dispense Refill  . ALPRAZolam (XANAX) 0.5 MG tablet Take 0.5 mg by mouth at bedtime as needed for anxiety.    Marland Kitchen aspirin 325 MG EC tablet Take 325 mg by mouth daily.    Marland Kitchen escitalopram (LEXAPRO) 20 MG tablet Take 20 mg by mouth daily.     . flecainide (TAMBOCOR) 100 MG tablet TAKE 1 TABLET (100 MG TOTAL) BY MOUTH 2 (TWO) TIMES DAILY. 60 tablet 11  . metoprolol succinate (TOPROL-XL) 100 MG 24 hr tablet Take 50 mg by mouth daily. Take with or immediately following a meal.     No current facility-administered medications for this visit.    Allergies:   Adhesive; Benazepril; and Sulfa antibiotics    Social History:  The patient  reports that she has never smoked. She does not have any smokeless tobacco history on file. She reports that she drinks alcohol. She reports that she does not use illicit drugs.   Family History:  The patient's  family history includes Heart disease in her mother.    ROS:  Please see the history of present illness.   Otherwise, review of systems are positive for knee pain.   All other systems are reviewed and negative.    PHYSICAL EXAM: VS:  BP 140/90 mmHg  Pulse 56  Ht 6\' 1"  (1.854 m)  Wt 297 lb 12.8 oz (135.081 kg)  BMI 39.30 kg/m2 , BMI Body mass index is 39.3 kg/(m^2). GEN: Well nourished, well developed, in no acute distress HEENT: normal Neck: no JVD, carotid bruits, or masses Cardiac: RRR; no murmurs, rubs, or gallops,no edema  Respiratory:  clear to auscultation bilaterally, normal work of breathing GI: soft, nontender, nondistended, + BS MS: no deformity or atrophy Skin: warm and dry, no rash Neuro:  Strength and sensation are intact Psych: euthymic mood, full affect   EKG:   The ekg ordered today demonstrates sinus bradycardia, RBBB   Recent Labs: No results found for requested labs within last 365 days.   Lipid Panel No results found for: CHOL, TRIG, HDL, CHOLHDL, VLDL, LDLCALC, LDLDIRECT   Other studies Reviewed: Additional studies/ records that were reviewed today with results demonstrating: 2015 ECG with  RBBB, NSR.  EF 50-55% on 2009 echo.    ASSESSMENT AND PLAN:  AFib:  Continue Flecainide Acetate Tablet, 100 MG, 1 tablet, Orally, every 12 hrs Continue Aspirin EC Tablet Delayed Release, 325 MG, 1 tablet, Orally, once a day       Notes: Maintaining NSR. No sx of PVCs.  No sx of AFib.  No sx of bradycardia. I asked her to watch out for lightheadedness or syncope. If she had either of these, could decrease her beta blocker dose.           2. HTN: Continue Metoprolol Succinate Tablet Extended Release 24 Hour, 100 MG, 1/2 tablet, Once a day Notes: Controlled.   3. Obesity: Has not lost weight.  I encouraged exercise that does not bother her knee.  4.OSA: Continue using CPAP.  Treatment of OSA can help rpevent AFib recurrence.   Current medicines are  reviewed at length with the patient today.  The patient concerns regarding her medicines were addressed.  The following changes have been made:  No change  Labs/ tests ordered today include: none  No orders of the defined types were placed in this encounter.    Recommend 150 minutes/week of aerobic exercise Low fat, low carb, high fiber diet recommended  Disposition:   FU in 1 year   Teresita Madura., MD  02/20/2015 8:05 AM    [facility] [place], [place], [place]  [address] Phone: [phone]; Fax: [phone]

## 2015-02-20 NOTE — Patient Instructions (Signed)
Medication Instructions:  Same-no change  Labwork: None  Testing/Procedures: None  Follow-Up: Your physician wants you to follow-up in: 1 year. You will receive a reminder letter in the mail two months in advance. If you don't receive a letter, please call our office to schedule the follow-up appointment.      

## 2015-02-28 DIAGNOSIS — Z961 Presence of intraocular lens: Secondary | ICD-10-CM | POA: Insufficient documentation

## 2015-04-09 ENCOUNTER — Encounter: Payer: Self-pay | Admitting: Gastroenterology

## 2015-05-27 ENCOUNTER — Other Ambulatory Visit: Payer: Self-pay

## 2015-05-27 DIAGNOSIS — Z1231 Encounter for screening mammogram for malignant neoplasm of breast: Secondary | ICD-10-CM

## 2015-06-03 DIAGNOSIS — H2511 Age-related nuclear cataract, right eye: Secondary | ICD-10-CM | POA: Insufficient documentation

## 2015-07-14 ENCOUNTER — Ambulatory Visit
Admission: RE | Admit: 2015-07-14 | Discharge: 2015-07-14 | Disposition: A | Discharge status: Home / Self Care | Payer: 59 | Source: Ambulatory Visit

## 2015-07-14 DIAGNOSIS — Z1231 Encounter for screening mammogram for malignant neoplasm of breast: Secondary | ICD-10-CM

## 2015-07-15 ENCOUNTER — Encounter: Payer: Self-pay | Admitting: Internal Medicine

## 2015-07-15 ENCOUNTER — Telehealth: Payer: Self-pay | Admitting: Internal Medicine

## 2015-07-15 NOTE — Telephone Encounter (Signed)
Left a message for patient to call back. 

## 2015-07-16 NOTE — Telephone Encounter (Signed)
Patient was already scheduled for recall colon with Dr. Olevia Perches. She had rescheduled to Dr. Havery Moros but she does not care who does it if she can have it done on April

## 2015-07-16 NOTE — Telephone Encounter (Signed)
Thank you. I will call patient as soon as our April schedule opens up.

## 2015-07-30 NOTE — Telephone Encounter (Signed)
Left message for patient to return my call to schedule colon

## 2015-08-01 ENCOUNTER — Encounter: Payer: Self-pay | Admitting: Gastroenterology

## 2015-09-24 ENCOUNTER — Ambulatory Visit (AMBULATORY_SURGERY_CENTER): Payer: Self-pay | Admitting: *Deleted

## 2015-09-24 VITALS — Ht 74.0 in | Wt 292.0 lb

## 2015-09-24 DIAGNOSIS — Z1211 Encounter for screening for malignant neoplasm of colon: Secondary | ICD-10-CM

## 2015-09-24 NOTE — Progress Notes (Signed)
Denies allergies to eggs or soy products. Denies complications with sedation or anesthesia. Denies O2 use. Denies use of diet or weight loss medications.  Emmi instructions given for colonoscopy.  

## 2015-10-08 ENCOUNTER — Encounter: Payer: Self-pay | Admitting: Gastroenterology

## 2015-10-08 ENCOUNTER — Ambulatory Visit (AMBULATORY_SURGERY_CENTER): Payer: 59 | Admitting: Gastroenterology

## 2015-10-08 VITALS — BP 118/72 | HR 63 | Temp 95.7°F | Resp 16 | Ht 74.0 in | Wt 292.0 lb

## 2015-10-08 DIAGNOSIS — Z1211 Encounter for screening for malignant neoplasm of colon: Secondary | ICD-10-CM | POA: Diagnosis present

## 2015-10-08 MED ORDER — SODIUM CHLORIDE 0.9 % IV SOLN: 500.0000 mL | INTRAVENOUS | Status: DC

## 2015-10-08 NOTE — Patient Instructions (Signed)
Impressions/recommendations:  Hemorrhoids (handout given) Repeat colonoscopy in 10 years.  YOU HAD AN ENDOSCOPIC PROCEDURE TODAY AT THE [facility]:   Refer to the procedure report that was given to you for any specific questions about what was found during the examination.  If the procedure report does not answer your questions, please call your gastroenterologist to clarify.  If you requested that your care partner not be given the details of your procedure findings, then the procedure report has been included in a sealed envelope for you to review at your convenience later.  YOU SHOULD EXPECT: Some feelings of bloating in the abdomen. Passage of more gas than usual.  Walking can help get rid of the air that was put into your GI tract during the procedure and reduce the bloating. If you had a lower endoscopy (such as a colonoscopy or flexible sigmoidoscopy) you may notice spotting of blood in your stool or on the toilet paper. If you underwent a bowel prep for your procedure, you may not have a normal bowel movement for a few days.  Please Note:  You might notice some irritation and congestion in your nose or some drainage.  This is from the oxygen used during your procedure.  There is no need for concern and it should clear up in a day or so.  SYMPTOMS TO REPORT IMMEDIATELY:   Following lower endoscopy (colonoscopy or flexible sigmoidoscopy):  Excessive amounts of blood in the stool  Significant tenderness or worsening of abdominal pains  Swelling of the abdomen that is new, acute  Fever of 100F or higher  For urgent or emergent issues, a gastroenterologist can be reached at any hour by calling [phone].   DIET: Your first meal following the procedure should be a small meal and then it is ok to progress to your normal diet. Heavy or fried foods are harder to digest and may make you feel nauseous or bloated.  Likewise, meals heavy in dairy and vegetables can increase  bloating.  Drink plenty of fluids but you should avoid alcoholic beverages for 24 hours.  ACTIVITY:  You should plan to take it easy for the rest of today and you should NOT DRIVE or use heavy machinery until tomorrow (because of the sedation medicines used during the test).    FOLLOW UP: Our staff will call the number listed on your records the next business day following your procedure to check on you and address any questions or concerns that you may have regarding the information given to you following your procedure. If we do not reach you, we will leave a message.  However, if you are feeling well and you are not experiencing any problems, there is no need to return our call.  We will assume that you have returned to your regular daily activities without incident.  If any biopsies were taken you will be contacted by phone or by letter within the next 1-3 weeks.  Please call us at [phone] if you have not heard about the biopsies in 3 weeks.    SIGNATURES/CONFIDENTIALITY: You and/or your care partner have signed paperwork which will be entered into your electronic medical record.  These signatures attest to the fact that that the information above on your After Visit Summary has been reviewed and is understood.  Full responsibility of the confidentiality of this discharge information lies with you and/or your care-partner. 

## 2015-10-08 NOTE — Progress Notes (Signed)
A/ox3, pleased with MAC, report to RN 

## 2015-10-08 NOTE — Op Note (Signed)
[facility] Patient Name: Deborah [name] Procedure Date: 10/08/2015 10:37 AM MRN: [id number] Endoscopist: [place]. Danis MD, MD Age: 60 Date of Birth: September 11, 1955 Gender: Female Procedure:                Colonoscopy Indications:              Screening for colorectal malignant neoplasm Medicines:                Monitored Anesthesia Care Procedure:                Pre-Anesthesia Assessment:                           - Prior to the procedure, a History and Physical                            was performed, and patient medications and                            allergies were reviewed. The patient's tolerance of                            previous anesthesia was also reviewed. The risks                            and benefits of the procedure and the sedation                            options and risks were discussed with the patient.                            All questions were answered, and informed consent                            was obtained. Prior Anticoagulants: The patient has                            taken no previous anticoagulant or antiplatelet                            agents. ASA Grade Assessment: II - A patient with                            mild systemic disease. After reviewing the risks                            and benefits, the patient was deemed in                            satisfactory condition to undergo the procedure.                           After obtaining informed consent, the colonoscope  was passed under direct vision. Throughout the                            procedure, the patient's blood pressure, pulse, and                            oxygen saturations were monitored continuously. The                            Model CF-HQ190L [id number]) scope was introduced                            through the anus and advanced to the the cecum,                            identified by appendiceal orifice and ileocecal                       valve. The colonoscopy was performed without                            difficulty. The patient tolerated the procedure                            well. The quality of the bowel preparation was                            good. The ileocecal valve, appendiceal orifice, and                            rectum were photographed. The quality of the bowel                            preparation was evaluated using the BBPS [facility]                            Bowel Preparation Scale) with scores of: Right                            Colon = 2, Transverse Colon = 2 and Left Colon = 2.                            The total BBPS score equals 6. The bowel                            preparation used was Miralax. Scope In: 10:56:42 AM Scope Out: 11:09:05 AM Scope Withdrawal Time: 0 hours 8 minutes 6 seconds  Total Procedure Duration: 0 hours 12 minutes 23 seconds  Findings:                 The perianal and digital rectal examinations were                            normal.  Internal hemorrhoids were found during                            retroflexion. The hemorrhoids were Grade I                            (internal hemorrhoids that do not prolapse).                           The exam was otherwise without abnormality. Complications:            No immediate complications. Estimated Blood Loss:     Estimated blood loss: none. Impression:               - Internal hemorrhoids.                           - The examination was otherwise normal.                           - No specimens collected. Recommendation:           - Patient has a contact number available for                            emergencies. The signs and symptoms of potential                            delayed complications were discussed with the                            patient. Return to normal activities tomorrow.                            Written discharge instructions were provided to the                             patient.                           - Resume previous diet.                           - Continue present medications.                           - Repeat colonoscopy in 10 years for screening                            purposes. Henry L. Danis MD, MD 10/08/2015 11:15:09 AM This report has been signed electronically.

## 2015-10-09 ENCOUNTER — Telehealth: Payer: Self-pay | Admitting: *Deleted

## 2015-10-09 NOTE — Telephone Encounter (Signed)
Name identifier, left message, follow-up 

## 2015-10-13 ENCOUNTER — Telehealth: Payer: Self-pay

## 2015-10-13 NOTE — Telephone Encounter (Signed)
**Note De-Identified Kagan Hietpas Obfuscation** Jettie Booze, MD  [place], LPN            She needs a visit with an APP or me and ECG in the next week.   JV      The pt has been scheduled to see Dr Irish Lack at 10:00 on 10/14/15. She is in agreement with date and time of f/u.

## 2015-10-14 ENCOUNTER — Ambulatory Visit (INDEPENDENT_AMBULATORY_CARE_PROVIDER_SITE_OTHER): Payer: 59 | Admitting: Interventional Cardiology

## 2015-10-14 ENCOUNTER — Encounter: Payer: Self-pay | Admitting: Interventional Cardiology

## 2015-10-14 VITALS — BP 114/82 | HR 67 | Ht 73.0 in | Wt 293.1 lb

## 2015-10-14 DIAGNOSIS — E669 Obesity, unspecified: Secondary | ICD-10-CM | POA: Diagnosis not present

## 2015-10-14 DIAGNOSIS — I10 Essential (primary) hypertension: Secondary | ICD-10-CM

## 2015-10-14 DIAGNOSIS — G4733 Obstructive sleep apnea (adult) (pediatric): Secondary | ICD-10-CM

## 2015-10-14 DIAGNOSIS — I480 Paroxysmal atrial fibrillation: Secondary | ICD-10-CM

## 2015-10-14 MED ORDER — APIXABAN 5 MG PO TABS: 5.0000 mg | ORAL_TABLET | Freq: Two times a day (BID) | ORAL | Status: DC

## 2015-10-14 NOTE — Progress Notes (Signed)
Patient ID: Deborah [name], female   DOB: Aug 20, 1955, 60 y.o.   MRN: [id number]     Cardiology Office Note   Date:  10/14/2015   ID:  Deborah [name], DOB [birth year], MRN [id number]  PCP:  Simona Huh, MD    Chief Complaint  Patient presents with  . Follow-up    tired & flutter in chest  . Atrial Fibrillation     Wt Readings from Last 3 Encounters:  10/14/15 293 lb 1.9 oz (132.958 kg)  10/08/15 292 lb (132.45 kg)  09/24/15 292 lb (132.45 kg)       History of Present Illness: Deborah [name] is a 60 y.o. female  With a h/o PAF.  She has been maintained on Flecainide,  And aspirin for years.  She has been feeling tired over the past few weeks.  SHe had a colonoscopy a week ago and it was noted that she was in atrial fibrillation a that time, but rate controlled.  THe colonoscopy was essentially negative.  She needs another one in 10 years.   Other than the fatigue, she has felt well.  When she is in NSR, She has  Felt like her energy level was normal.   Walks with a cane due to knee arthritis.  Getting physical therapy.      Past Medical History  Diagnosis Date  . Obesity   . Anxiety   . OSA (obstructive sleep apnea)   . A-fib ([facility])   . HTN (hypertension)   . PAF (paroxysmal atrial fibrillation) ([place])     s/p DCCV 2010    Past Surgical History  Procedure Laterality Date  . Btl    . Tonsillectomy and adenoidectomy       Current Outpatient Prescriptions  Medication Sig Dispense Refill  . ALPRAZolam (XANAX) 0.5 MG tablet Take 0.5 mg by mouth at bedtime as needed for anxiety. Reported on 10/08/2015    . aspirin 325 MG EC tablet Take 325 mg by mouth daily.    Marland Kitchen escitalopram (LEXAPRO) 10 MG tablet Take 10 mg by mouth daily.  3  . flecainide (TAMBOCOR) 100 MG tablet TAKE 1 TABLET (100 MG TOTAL) BY MOUTH 2 (TWO) TIMES DAILY. 60 tablet 11  . metoprolol succinate (TOPROL-XL) 100 MG 24 hr tablet Take 50 mg by mouth daily. Take with or immediately following a meal.    .  traMADol (ULTRAM) 50 MG tablet Take 50 mg by mouth every 6 (six) hours as needed for moderate pain or severe pain. Reported on 10/08/2015     No current facility-administered medications for this visit.    Allergies:   Benazepril; Adhesive; and Sulfa antibiotics    Social History:  The patient  reports that she has never smoked. She has never used smokeless tobacco. She reports that she drinks alcohol. She reports that she does not use illicit drugs.   Family History:  The patient's family history includes Heart disease in her mother; Stomach cancer in her paternal grandfather. There is no history of Colon cancer.    ROS:  Please see the history of present illness.   Otherwise, review of systems are positive for fatigue.   All other systems are reviewed and negative.    PHYSICAL EXAM: VS:  BP 114/82 mmHg  Pulse 67  Ht 6\' 1"  (1.854 m)  Wt 293 lb 1.9 oz (132.958 kg)  BMI 38.68 kg/m2  SpO2 95% , BMI Body mass index is 38.68 kg/(m^2). GEN: Well nourished, well developed, in  no acute distress HEENT: normal Neck: no JVD, carotid bruits, or masses Cardiac: irregularly irregular; no murmurs, rubs, or gallops,no edema  Respiratory:  clear to auscultation bilaterally, normal work of breathing GI: soft, nontender, nondistended, + BS MS: no deformity or atrophy Skin: warm and dry, no rash Neuro:  Strength and sensation are intact Psych: euthymic mood, full affect   EKG:   The ekg ordered today demonstrates atrial flutter with controlled ventricular response   Recent Labs: No results found for requested labs within last 365 days.   Lipid Panel No results found for: CHOL, TRIG, HDL, CHOLHDL, VLDL, LDLCALC, LDLDIRECT   Other studies Reviewed: Additional studies/ records that were reviewed today with results demonstrating: .   ASSESSMENT AND PLAN:  1. AFib/ Aflutter: Today's ECG was reviewed with EP.  It shows atrial flutter with controlled rate.  She has had atrial fibrillation in  the past. Refer to EP for possible AFlutter/AFib ablation.  Cannot increase flecainide dose due to wide QRS.  Stop aspirin and start Eliquis 5 mg BID.   2. HTN: Controled. Continue current meds.  3. OSA: Continue CPAP.  4. Obesity: Weight loss limited by chronic knee problems.  This patients CHA2DS2-VASc Score and unadjusted Ischemic Stroke Rate (% per year) is equal to 2.2 % stroke rate/year from a score of 2  Above score calculated as 1 point each if present [CHF, HTN, DM, Vascular=MI/PAD/Aortic Plaque, Age if 65-74, or Female] Above score calculated as 2 points each if present [Age > 75, or Stroke/TIA/TE]      Current medicines are reviewed at length with the patient today.  The patient concerns regarding her medicines were addressed.  The following changes have been made:  No change  Labs/ tests ordered today include:  No orders of the defined types were placed in this encounter.    Recommend 150 minutes/week of aerobic exercise Low fat, low carb, high fiber diet recommended  Disposition:   FU with EP   Teresita Madura., MD  10/14/2015 10:18 AM    [facility] [place], [place], [place]  [address] Phone: [phone]; Fax: [phone]

## 2015-10-14 NOTE — Patient Instructions (Addendum)
**Note De-Identified Deborah [name] Obfuscation** Medication Instructions:  Stop taking Aspirin and start taking Eliquis 5 mg twice daily. All of your other medications remain the same.  Labwork: None  Testing/Procedures: None  Follow-Up: Your physician recommends that you schedule a follow-up appointment with one of our EP cardiologist due to your Atrial Fibrillation and Atrial Flutter to discuss Ablation.   Special Instructions: Per Dr Irish Lack you may resume Physical Therapy      If you need a refill on your cardiac medications before your next appointment, please call your pharmacy.

## 2015-10-16 ENCOUNTER — Ambulatory Visit (INDEPENDENT_AMBULATORY_CARE_PROVIDER_SITE_OTHER): Payer: 59 | Admitting: Cardiology

## 2015-10-16 ENCOUNTER — Encounter: Payer: Self-pay | Admitting: *Deleted

## 2015-10-16 ENCOUNTER — Encounter: Payer: Self-pay | Admitting: Cardiology

## 2015-10-16 VITALS — BP 128/88 | HR 104 | Ht 73.0 in | Wt 288.8 lb

## 2015-10-16 DIAGNOSIS — I4819 Other persistent atrial fibrillation: Secondary | ICD-10-CM

## 2015-10-16 DIAGNOSIS — I481 Persistent atrial fibrillation: Secondary | ICD-10-CM

## 2015-10-16 DIAGNOSIS — Z01812 Encounter for preprocedural laboratory examination: Secondary | ICD-10-CM

## 2015-10-16 NOTE — Progress Notes (Signed)
Electrophysiology Office Note   Date:  10/16/2015   ID:  Deborah [name], DOB [birth year], MRN [id number]  PCP:  Simona Huh, MD  Cardiologist:  Irish Lack Primary Electrophysiologist:  Marvine Encalade Meredith Leeds, MD    Chief Complaint  Patient presents with  . New Patient (Initial Visit)    discuss afib/flutter     History of Present Illness: Deborah [name] is a 60 y.o. female who presents today for electrophysiology evaluation.   She has a history of paroxysmal atrial fibrillation.  She has been maintained on flecainide.  She has been tired for the last few weeks and was noted to be in atrial fibrillation, rate controlled, when she showed up for her colonoscopy.  Other than fatigue, she feels well.  When in sinus rhythm, she has no complaints of fatigue.She says that her main symptoms are due to palpitations. She does also have symptoms of shortness of breath and fatigue. She says that she has had cardioversions in the past done twice and has been in sinus rhythm after those for quite a while.   Today, she denies symptoms of chest pain, orthopnea, PND, lower extremity edema, claudication, dizziness, presyncope, syncope, bleeding, or neurologic sequela. The patient is tolerating medications without difficulties and is otherwise without complaint today.    Past Medical History  Diagnosis Date  . Obesity   . Anxiety   . OSA (obstructive sleep apnea)   . A-fib ([place])   . HTN (hypertension)   . PAF (paroxysmal atrial fibrillation) ([facility])     s/p DCCV 2010   Past Surgical History  Procedure Laterality Date  . Btl    . Tonsillectomy and adenoidectomy       Current Outpatient Prescriptions  Medication Sig Dispense Refill  . ALPRAZolam (XANAX) 0.5 MG tablet Take 0.5 mg by mouth at bedtime as needed for anxiety. Reported on 10/08/2015    . apixaban (ELIQUIS) 5 MG TABS tablet Take 1 tablet (5 mg total) by mouth 2 (two) times daily. 60 tablet 6  . escitalopram (LEXAPRO) 10 MG tablet Take 10  mg by mouth daily.  3  . flecainide (TAMBOCOR) 100 MG tablet TAKE 1 TABLET (100 MG TOTAL) BY MOUTH 2 (TWO) TIMES DAILY. 60 tablet 11  . metoprolol succinate (TOPROL-XL) 100 MG 24 hr tablet Take 50 mg by mouth daily. Take with or immediately following a meal.     No current facility-administered medications for this visit.    Allergies:   Benazepril; Adhesive; and Sulfa antibiotics   Social History:  The patient  reports that she has never smoked. She has never used smokeless tobacco. She reports that she drinks alcohol. She reports that she does not use illicit drugs.   Family History:  The patient's family history includes Heart disease in her mother; Stomach cancer in her paternal grandfather. There is no history of Colon cancer.    ROS:  Please see the history of present illness.   Otherwise, review of systems is positive for none.   All other systems are reviewed and negative.    PHYSICAL EXAM: VS:  Ht 6\' 1"  (1.854 m)  Wt 288 lb 12.8 oz (130.999 kg)  BMI 38.11 kg/m2 , BMI Body mass index is 38.11 kg/(m^2). GEN: Well nourished, well developed, in no acute distress HEENT: normal Neck: no JVD, carotid bruits, or masses Cardiac: tachycardic, irregular; no murmurs, rubs, or gallops,no edema  Respiratory:  clear to auscultation bilaterally, normal work of breathing GI: soft, nontender, nondistended, + BS  MS: no deformity or atrophy Skin: warm and dry Neuro:  Strength and sensation are intact Psych: euthymic mood, full affect  EKG:  EKG is ordered today. The ekg ordered today shows atrial fibrillation, rate 104, aberrant complexes, RBBB  Recent Labs: No results found for requested labs within last 365 days.    Lipid Panel  No results found for: CHOL, TRIG, HDL, CHOLHDL, VLDL, LDLCALC, LDLDIRECT   Wt Readings from Last 3 Encounters:  10/16/15 288 lb 12.8 oz (130.999 kg)  10/14/15 293 lb 1.9 oz (132.958 kg)  10/08/15 292 lb (132.45 kg)      Other studies  Reviewed: Additional studies/ records that were reviewed today include: Epic notes   ASSESSMENT AND PLAN:  1.  Persistent atrial fibrillation: Patient presents today in atrial fibrillation. She also has a history of atrial flutter. She had been maintained on flecainide, but this apparently has stopped working to this point. She was recently placed on anticoagulation with apixaban, and she is tolerating this well. I have discussed with her the options of continued medical management versus ablation for atrial fibrillation. It does not appear that amiodarone is a good drug choice, as she is young and would expect side effects down the road. She does not like the idea being admitted for 3 days for T consent. She is interested in ablation. I discussed with her the risks and benefits of ablation. Risks include bleeding, tamponade, heart block, stroke, and damage to surrounding organs such as the esophagus. The patient understands these risks and feels that this is the best option for her. She has not had an echocardiogram since 2009, and therefore we Jamilya Sarrazin schedule an echo. We Eagan Shifflett schedule her for an ablation. She was placed on anticoagulation one week ago, therefore we would need to wait 2 weeks for ablation to be scheduled.   This patients CHA2DS2-VASc Score and unadjusted Ischemic Stroke Rate (% per year) is equal to 2.2 % stroke rate/year from a score of 2  Above score calculated as 1 point each if present [CHF, HTN, DM, Vascular=MI/PAD/Aortic Plaque, Age if 65-74, or Female] Above score calculated as 2 points each if present [Age > 75, or Stroke/TIA/TE]       Current medicines are reviewed at length with the patient today.   The patient does not have concerns regarding her medicines.  The following changes were made today:  none  Labs/ tests ordered today include:  No orders of the defined types were placed in this encounter.     Disposition:   FU with Sherrice Creekmore post  ablation  Signed, Tarin Navarez Meredith Leeds, MD  10/16/2015 2:27 PM     [facility] [address] [place] [address] [phone] (office) [phone] (fax)

## 2015-10-16 NOTE — Patient Instructions (Addendum)
Medication Instructions:  Your physician recommends that you continue on your current medications as directed. Please refer to the Current Medication list given to you today.  Labwork: Your physician recommends that you return for lab work within 7-10 days of procedure scheduled for 12/04/2015  Testing/Procedures: Your physician has requested that you have an echocardiogram. Echocardiography is a painless test that uses sound waves to create images of your heart. It provides your doctor with information about the size and shape of your heart and how well your heart's chambers and valves are working. This procedure takes approximately one hour. There are no restrictions for this procedure.  Your physician has requested that you have cardiac CT. Cardiac computed tomography (CT) is a painless test that uses an x-ray machine to take clear, detailed pictures of your heart. For further information please visit [email or web address].    Office will contact you to schedule this test after we have received prior authorization from you insurance company.  Your physician has recommended that you have an AFib ablation. Catheter ablation is a medical procedure used to treat some cardiac arrhythmias (irregular heartbeats). During catheter ablation, a long, thin, flexible tube is put into a blood vessel in your groin (upper thigh), or neck. This tube is called an ablation catheter. It is then guided to your heart through the blood vessel. Radio frequency waves destroy small areas of heart tissue where abnormal heartbeats may cause an arrhythmia to start. Please see the instruction sheet given to you today.  Follow-Up: Your physician recommends that you schedule a follow-up appointment in: within 10-14 days of procedure on 6/8, with Dr. Camnitz/Amber/Renee.  (this is for H&P and labs)  Your physician recommends that you schedule a follow-up appointment in: 4 weeks, after procedure on 12/04/15, with Roderic Palau, NP in  the [facility].  Your physician recommends that you schedule a follow-up appointment in: 3 months, after procedure on 12/04/2015, with Dr. Curt Bears.  If you need a refill on your cardiac medications before your next appointment, please call your pharmacy.  Thank you for choosing [facility]!!   Trinidad Curet, RN [phone]    Any Other Special Instructions Will Be Listed Below (If Applicable). Cardiac Ablation Cardiac ablation is a procedure to disable a small amount of heart tissue in very specific places. The heart has many electrical connections. Sometimes these connections are abnormal and can cause the heart to beat very fast or irregularly. By disabling some of the problem areas, heart rhythm can be improved or made normal. Ablation is done for people who:   Have Wolff-Parkinson-White syndrome.   Have other fast heart rhythms (tachycardia).   Have taken medicines for an abnormal heart rhythm (arrhythmia) that resulted in:   No success.   Side effects.   May have a high-risk heartbeat that could result in death.  LET [facility] CARE PROVIDER KNOW ABOUT:   Any allergies you have or any previous reactions you have had to X-ray dye, food (such as seafood), medicine, or tape.   All medicines you are taking, including vitamins, herbs, eye drops, creams, and over-the-counter medicines.   Previous problems you or members of your family have had with the use of anesthetics.   Any blood disorders you have.   Previous surgeries or procedures (such as a kidney transplant) you have had.   Medical conditions you have (such as kidney failure).  RISKS AND COMPLICATIONS Generally, cardiac ablation is a safe procedure. However, problems can occur and include:  Increased risk of cancer. Depending on how long it takes to do the ablation, the dose of radiation can be high.  Bruising and bleeding where a thin, flexible tube (catheter) was inserted during the  procedure.   Bleeding into the chest, especially into the sac that surrounds the heart (serious).  Need for a permanent pacemaker if the normal electrical system is damaged.   The procedure may not be fully effective, and this may not be recognized for months. Repeat ablation procedures are sometimes required. BEFORE THE PROCEDURE   Follow any instructions from your health care provider regarding eating and drinking before the procedure.   Take your medicines as directed at regular times with water, unless instructed otherwise by your health care provider. If you are taking diabetes medicine, including insulin, ask how you are to take it and if there are any special instructions you should follow. It is common to adjust insulin dosing the day of the ablation.  PROCEDURE  An ablation is usually performed in a catheterization laboratory with the guidance of fluoroscopy. Fluoroscopy is a type of X-ray that helps your health care provider see images of your heart during the procedure.   An ablation is a minimally invasive procedure. This means a small cut (incision) is made in either your neck or groin. Your health care provider will decide where to make the incision based on your medical history and physical exam.  An IV tube will be started before the procedure begins. You will be given an anesthetic or medicine to help you relax (sedative).  The skin on your neck or groin will be numbed. A needle will be inserted into a large vein in your neck or groin and catheters will be threaded to your heart.  A special dye that shows up on fluoroscopy pictures may be injected through the catheter. The dye helps your health care provider see the area of the heart that needs treatment.  The catheter has electrodes on the tip. When the area of heart tissue that is causing the arrhythmia is found, the catheter tip will send an electrical current to the area and "scar" the tissue. Three types of energy  can be used to ablate the heart tissue:   Heat (radiofrequency energy).   Laser energy.   Extreme cold (cryoablation).   When the area of the heart has been ablated, the catheter will be taken out. Pressure will be held on the insertion site. This will help the insertion site clot and keep it from bleeding. A bandage will be placed on the insertion site.  AFTER THE PROCEDURE   After the procedure, you will be taken to a recovery area where your vital signs (blood pressure, heart rate, and breathing) will be monitored. The insertion site will also be monitored for bleeding.   You will need to lie still for 4-6 hours. This is to ensure you do not bleed from the catheter insertion site.    This information is not intended to replace advice given to you by your health care provider. Make sure you discuss any questions you have with your health care provider.   Document Released: 10/31/2008 Document Revised: 07/05/2014 Document Reviewed: 11/06/2012 Elsevier Interactive Patient Education [employer].

## 2015-10-17 ENCOUNTER — Telehealth: Payer: Self-pay | Admitting: *Deleted

## 2015-10-17 NOTE — Telephone Encounter (Signed)
Called patient back to add that we cancelled the 5/8 appt w/ PA. Explained that was an necessary appt. Patient agreeable and states she forgot to cancel that yesterday. She thanks me for calling back to inform her.

## 2015-10-17 NOTE — Telephone Encounter (Signed)
Follow up   Pt called and verbalized she is returning call to Dr. Loletha Grayer nurse. She stated she received a vm to call about an appt change,  Upon reading rn note, and viewing recent changed appt per provider 10/16/15; I informed pt that 12-15-15 appt was rescheduled and that 11/25/15 she has 3 appt on that date  And if rn has anything else she would like to add I will have her return call  Pt wrote all of the dates and times down and thanked me for call

## 2015-10-17 NOTE — Telephone Encounter (Signed)
lmtcb  (need to inform patient of appointment changes)

## 2015-10-20 ENCOUNTER — Encounter: Payer: Self-pay | Admitting: Cardiology

## 2015-10-20 ENCOUNTER — Telehealth: Payer: Self-pay | Admitting: Cardiology

## 2015-10-20 NOTE — Telephone Encounter (Signed)
10-20-15 Lvm on cell/home to call and schedule cardaic ct/saf

## 2015-10-28 DIAGNOSIS — H26492 Other secondary cataract, left eye: Secondary | ICD-10-CM | POA: Insufficient documentation

## 2015-11-03 ENCOUNTER — Ambulatory Visit: Payer: 59 | Admitting: Cardiology

## 2015-11-10 ENCOUNTER — Other Ambulatory Visit: Payer: Self-pay | Admitting: *Deleted

## 2015-11-25 ENCOUNTER — Other Ambulatory Visit: Payer: Self-pay

## 2015-11-25 ENCOUNTER — Other Ambulatory Visit (INDEPENDENT_AMBULATORY_CARE_PROVIDER_SITE_OTHER): Payer: 59 | Admitting: *Deleted

## 2015-11-25 ENCOUNTER — Encounter: Payer: Self-pay | Admitting: Cardiology

## 2015-11-25 ENCOUNTER — Ambulatory Visit (HOSPITAL_COMMUNITY): Payer: 59 | Attending: Cardiovascular Disease

## 2015-11-25 ENCOUNTER — Ambulatory Visit (INDEPENDENT_AMBULATORY_CARE_PROVIDER_SITE_OTHER): Payer: 59 | Admitting: Cardiology

## 2015-11-25 VITALS — BP 120/66 | HR 68 | Ht 73.0 in | Wt 289.0 lb

## 2015-11-25 DIAGNOSIS — Z01812 Encounter for preprocedural laboratory examination: Secondary | ICD-10-CM

## 2015-11-25 DIAGNOSIS — E669 Obesity, unspecified: Secondary | ICD-10-CM | POA: Diagnosis not present

## 2015-11-25 DIAGNOSIS — I119 Hypertensive heart disease without heart failure: Secondary | ICD-10-CM | POA: Diagnosis not present

## 2015-11-25 DIAGNOSIS — I481 Persistent atrial fibrillation: Secondary | ICD-10-CM

## 2015-11-25 DIAGNOSIS — Z6838 Body mass index (BMI) 38.0-38.9, adult: Secondary | ICD-10-CM | POA: Diagnosis not present

## 2015-11-25 DIAGNOSIS — I4819 Other persistent atrial fibrillation: Secondary | ICD-10-CM

## 2015-11-25 DIAGNOSIS — I4891 Unspecified atrial fibrillation: Secondary | ICD-10-CM | POA: Diagnosis present

## 2015-11-25 LAB — CBC WITH DIFFERENTIAL/PLATELET
BASOS ABS: 0 {cells}/uL (ref 0–200)
Basophils Relative: 0 %
EOS ABS: 158 {cells}/uL (ref 15–500)
EOS PCT: 2 %
HCT: 40.6 % (ref 35.0–45.0)
Hemoglobin: 13.1 g/dL (ref 11.7–15.5)
LYMPHS PCT: 29 %
Lymphs Abs: 2291 cells/uL (ref 850–3900)
MCH: 31.6 pg (ref 27.0–33.0)
MCHC: 32.3 g/dL (ref 32.0–36.0)
MCV: 98.1 fL (ref 80.0–100.0)
MONOS PCT: 7 %
MPV: 10.9 fL (ref 7.5–12.5)
Monocytes Absolute: 553 cells/uL (ref 200–950)
NEUTROS PCT: 62 %
Neutro Abs: 4898 cells/uL (ref 1500–7800)
PLATELETS: 347 10*3/uL (ref 140–400)
RBC: 4.14 MIL/uL (ref 3.80–5.10)
RDW: 20.6 % — AB (ref 11.0–15.0)
WBC: 7.9 10*3/uL (ref 3.8–10.8)

## 2015-11-25 NOTE — Progress Notes (Signed)
Electrophysiology Office Note   Date:  11/25/2015   ID:  LEEANNE WHELAN, DOB June 07, 1956, MRN [id number]  PCP:  Simona Huh, MD  Cardiologist:  Irish Lack Primary Electrophysiologist:  Marquis Diles Meredith Leeds, MD    Chief Complaint  Patient presents with  . Follow-up  . persistant afib     History of Present Illness: Deborah [name] is a 60 y.o. female who presents today for electrophysiology evaluation.   She has a history of paroxysmal atrial fibrillation.  She has an ablation scheduled on June 8.   Today, she denies symptoms of chest pain, orthopnea, PND, lower extremity edema, claudication, dizziness, presyncope, syncope, bleeding, or neurologic sequela. The patient is tolerating medications without difficulties and is otherwise without complaint today. She is unsure of whether or not she is in atrial fibrillation.   Past Medical History  Diagnosis Date  . Obesity   . Anxiety   . OSA (obstructive sleep apnea)   . A-fib ([place])   . HTN (hypertension)   . PAF (paroxysmal atrial fibrillation) ([place])     s/p DCCV 2010   Past Surgical History  Procedure Laterality Date  . Btl    . Tonsillectomy and adenoidectomy       Current Outpatient Prescriptions  Medication Sig Dispense Refill  . ALPRAZolam (XANAX) 0.5 MG tablet Take 0.5 mg by mouth at bedtime as needed for anxiety. Reported on 10/08/2015    . apixaban (ELIQUIS) 5 MG TABS tablet Take 1 tablet (5 mg total) by mouth 2 (two) times daily. 60 tablet 6  . escitalopram (LEXAPRO) 10 MG tablet Take 10 mg by mouth daily.  3  . flecainide (TAMBOCOR) 100 MG tablet TAKE 1 TABLET (100 MG TOTAL) BY MOUTH 2 (TWO) TIMES DAILY. 60 tablet 11  . metoprolol succinate (TOPROL-XL) 100 MG 24 hr tablet Take 50 mg by mouth daily. Take with or immediately following a meal.     No current facility-administered medications for this visit.    Allergies:   Benazepril; Adhesive; and Sulfa antibiotics   Social History:  The patient  reports that  she has never smoked. She has never used smokeless tobacco. She reports that she drinks alcohol. She reports that she does not use illicit drugs.   Family History:  The patient's family history includes Heart disease in her mother; Stomach cancer in her paternal grandfather. There is no history of Colon cancer.    ROS:  Please see the history of present illness.   Otherwise, review of systems is positive for palpitations, anxiety.   All other systems are reviewed and negative.    PHYSICAL EXAM: VS:  BP 120/66 mmHg  Pulse 68  Ht 6\' 1"  (1.854 m)  Wt 289 lb (131.09 kg)  BMI 38.14 kg/m2 , BMI Body mass index is 38.14 kg/(m^2). GEN: Well nourished, well developed, in no acute distress HEENT: normal Neck: no JVD, carotid bruits, or masses Cardiac: irregular; no murmurs, rubs, or gallops,no edema  Respiratory:  clear to auscultation bilaterally, normal work of breathing GI: soft, nontender, nondistended, + BS MS: no deformity or atrophy Skin: warm and dry Neuro:  Strength and sensation are intact Psych: euthymic mood, full affect  EKG:  EKG is not ordered today.   Recent Labs: No results found for requested labs within last 365 days.    Lipid Panel  No results found for: CHOL, TRIG, HDL, CHOLHDL, VLDL, LDLCALC, LDLDIRECT   Wt Readings from Last 3 Encounters:  11/25/15 289 lb (131.09 kg)  10/16/15 288 lb 12.8 oz (130.999 kg)  10/14/15 293 lb 1.9 oz (132.958 kg)      Other studies Reviewed: Additional studies/ records that were reviewed today include: TTE pending   ASSESSMENT AND PLAN:  1.  Persistent atrial fibrillation: Patient presents today in atrial fibrillation. She also has a history of atrial flutter. She had been maintained on flecainide, but this apparently has stopped working to this point. She was recently placed on anticoagulation with apixaban, and she is tolerating this well. She has plans for ablation of her atrial fibrillation suite. Risks and benefits of the  procedure were explained. Risks include bleeding, tamponade, heart block, stroke, and damage to surrounding organs. She understands these risks and has agreed to the procedure. We Desma Wilkowski get pre-procedure labs today.  This patients CHA2DS2-VASc Score and unadjusted Ischemic Stroke Rate (% per year) is equal to 2.2 % stroke rate/year from a score of 2  Above score calculated as 1 point each if present [CHF, HTN, DM, Vascular=MI/PAD/Aortic Plaque, Age if 65-74, or Female] Above score calculated as 2 points each if present [Age > 75, or Stroke/TIA/TE]       Current medicines are reviewed at length with the patient today.   The patient does not have concerns regarding her medicines.  The following changes were made today:  none  Labs/ tests ordered today include:  No orders of the defined types were placed in this encounter.     Disposition:   FU with Aliyha Fornes post ablation  Signed, Mercia Dowe Meredith Leeds, MD  11/25/2015 2:52 PM     [facility] [address] [place] [address] [phone] (office) [phone] (fax)

## 2015-11-25 NOTE — Patient Instructions (Signed)
Medication Instructions:  Your physician recommends that you continue on your current medications as directed. Please refer to the Current Medication list given to you today.  Labwork: Pre procedure labs today: BMP & CBC  Testing/Procedures: None ordered  Follow-Up: Keep currently scheduled follow up  If you need a refill on your cardiac medications before your next appointment, please call your pharmacy.  Thank you for choosing [facility]!!   Trinidad Curet, RN [phone]

## 2015-11-25 NOTE — Addendum Note (Signed)
Addended by: Eulis Foster on: 11/25/2015 01:41 PM   Modules accepted: Orders

## 2015-11-26 LAB — BASIC METABOLIC PANEL
BUN: 14 mg/dL (ref 7–25)
CALCIUM: 9.5 mg/dL (ref 8.6–10.4)
CO2: 27 mmol/L (ref 20–31)
CREATININE: 0.92 mg/dL (ref 0.50–0.99)
Chloride: 107 mmol/L (ref 98–110)
Glucose, Bld: 87 mg/dL (ref 65–99)
Potassium: 4.6 mmol/L (ref 3.5–5.3)
SODIUM: 144 mmol/L (ref 135–146)

## 2015-11-27 ENCOUNTER — Encounter (HOSPITAL_COMMUNITY): Payer: Self-pay

## 2015-11-27 ENCOUNTER — Ambulatory Visit (HOSPITAL_COMMUNITY)
Admission: RE | Admit: 2015-11-27 | Discharge: 2015-11-27 | Disposition: A | Discharge status: Home / Self Care | Payer: 59 | Source: Ambulatory Visit | Attending: Cardiology | Admitting: Cardiology

## 2015-11-27 DIAGNOSIS — K449 Diaphragmatic hernia without obstruction or gangrene: Secondary | ICD-10-CM | POA: Diagnosis not present

## 2015-11-27 DIAGNOSIS — I481 Persistent atrial fibrillation: Secondary | ICD-10-CM | POA: Diagnosis not present

## 2015-11-27 DIAGNOSIS — I4819 Other persistent atrial fibrillation: Secondary | ICD-10-CM

## 2015-11-27 MED ORDER — IOPAMIDOL (ISOVUE-370) INJECTION 76%
INTRAVENOUS | Status: AC
  Administered 2015-11-27: 100 mL
  Filled 2015-11-27: qty 100

## 2015-12-04 ENCOUNTER — Encounter (HOSPITAL_COMMUNITY): Payer: Self-pay | Admitting: Certified Registered"

## 2015-12-04 ENCOUNTER — Encounter (HOSPITAL_COMMUNITY)
Admission: RE | Disposition: A | Discharge status: Home / Self Care | Payer: Self-pay | Source: Ambulatory Visit | Attending: Cardiology

## 2015-12-04 ENCOUNTER — Ambulatory Visit (HOSPITAL_COMMUNITY)
Admission: RE | Admit: 2015-12-04 | Discharge: 2015-12-05 | Disposition: A | Discharge status: Home / Self Care | Payer: 59 | Source: Ambulatory Visit | Attending: Cardiology | Admitting: Cardiology

## 2015-12-04 ENCOUNTER — Ambulatory Visit (HOSPITAL_COMMUNITY): Payer: 59 | Admitting: Certified Registered"

## 2015-12-04 DIAGNOSIS — I10 Essential (primary) hypertension: Secondary | ICD-10-CM | POA: Diagnosis not present

## 2015-12-04 DIAGNOSIS — E669 Obesity, unspecified: Secondary | ICD-10-CM | POA: Insufficient documentation

## 2015-12-04 DIAGNOSIS — G4733 Obstructive sleep apnea (adult) (pediatric): Secondary | ICD-10-CM | POA: Insufficient documentation

## 2015-12-04 DIAGNOSIS — I483 Typical atrial flutter: Secondary | ICD-10-CM | POA: Diagnosis not present

## 2015-12-04 DIAGNOSIS — Z6838 Body mass index (BMI) 38.0-38.9, adult: Secondary | ICD-10-CM | POA: Diagnosis not present

## 2015-12-04 DIAGNOSIS — Z7901 Long term (current) use of anticoagulants: Secondary | ICD-10-CM | POA: Diagnosis not present

## 2015-12-04 DIAGNOSIS — I481 Persistent atrial fibrillation: Secondary | ICD-10-CM | POA: Diagnosis not present

## 2015-12-04 HISTORY — PX: ELECTROPHYSIOLOGIC STUDY: SHX172A

## 2015-12-04 LAB — MRSA PCR SCREENING: MRSA by PCR: NEGATIVE

## 2015-12-04 LAB — POCT ACTIVATED CLOTTING TIME
ACTIVATED CLOTTING TIME: 307 s
ACTIVATED CLOTTING TIME: 153 s
Activated Clotting Time: 246 seconds
Activated Clotting Time: 296 seconds
Activated Clotting Time: 301 seconds

## 2015-12-04 SURGERY — ATRIAL FIBRILLATION ABLATION
Anesthesia: Monitor Anesthesia Care

## 2015-12-04 MED ORDER — OXYCODONE HCL 5 MG PO TABS
5.0000 mg | ORAL_TABLET | ORAL | Status: DC | PRN
  Administered 2015-12-04: 5 mg via ORAL
  Filled 2015-12-04: qty 1

## 2015-12-04 MED ORDER — FLECAINIDE ACETATE 100 MG PO TABS
100.0000 mg | ORAL_TABLET | Freq: Two times a day (BID) | ORAL | Status: DC
  Administered 2015-12-04 – 2015-12-05 (×2): 100 mg via ORAL
  Filled 2015-12-04 (×2): qty 1

## 2015-12-04 MED ORDER — HEPARIN (PORCINE) IN NACL 2-0.9 UNIT/ML-% IJ SOLN
INTRAMUSCULAR | Status: AC
  Filled 2015-12-04: qty 500

## 2015-12-04 MED ORDER — ARTIFICIAL TEARS OP OINT
TOPICAL_OINTMENT | OPHTHALMIC | Status: DC | PRN
  Administered 2015-12-04 – 2015-12-05 (×2): via OPHTHALMIC
  Filled 2015-12-04: qty 3.5

## 2015-12-04 MED ORDER — SODIUM CHLORIDE 0.9% FLUSH
3.0000 mL | Freq: Two times a day (BID) | INTRAVENOUS | Status: DC
  Administered 2015-12-04 – 2015-12-05 (×3): 3 mL via INTRAVENOUS

## 2015-12-04 MED ORDER — OXYCODONE HCL 5 MG PO TABS
ORAL_TABLET | ORAL | Status: AC
  Filled 2015-12-04: qty 1

## 2015-12-04 MED ORDER — MIDAZOLAM HCL 5 MG/5ML IJ SOLN
INTRAMUSCULAR | Status: DC | PRN
  Administered 2015-12-04: 2 mg via INTRAVENOUS

## 2015-12-04 MED ORDER — ONDANSETRON HCL 4 MG/2ML IJ SOLN: 4.0000 mg | Freq: Four times a day (QID) | INTRAMUSCULAR | Status: DC | PRN

## 2015-12-04 MED ORDER — OXYCODONE HCL 5 MG PO TABS: 5.0000 mg | ORAL_TABLET | Freq: Once | ORAL | Status: DC | PRN

## 2015-12-04 MED ORDER — ONDANSETRON HCL 4 MG/2ML IJ SOLN: 4.0000 mg | Freq: Once | INTRAMUSCULAR | Status: DC | PRN

## 2015-12-04 MED ORDER — DOBUTAMINE IN D5W 4-5 MG/ML-% IV SOLN
INTRAVENOUS | Status: DC | PRN
  Administered 2015-12-04: 20 ug/kg/min via INTRAVENOUS

## 2015-12-04 MED ORDER — ACETAMINOPHEN 325 MG PO TABS
650.0000 mg | ORAL_TABLET | ORAL | Status: DC | PRN
Start: 2015-12-04 — End: 2015-12-05
  Administered 2015-12-05: 650 mg via ORAL
  Filled 2015-12-04: qty 2

## 2015-12-04 MED ORDER — HEPARIN SODIUM (PORCINE) 1000 UNIT/ML IJ SOLN
INTRAMUSCULAR | Status: DC | PRN
  Administered 2015-12-04 (×2): 1000 [IU] via INTRAVENOUS

## 2015-12-04 MED ORDER — ROCURONIUM BROMIDE 100 MG/10ML IV SOLN
INTRAVENOUS | Status: DC | PRN
  Administered 2015-12-04: 20 mg via INTRAVENOUS
  Administered 2015-12-04: 50 mg via INTRAVENOUS
  Administered 2015-12-04: 20 mg via INTRAVENOUS

## 2015-12-04 MED ORDER — SODIUM CHLORIDE 0.9% FLUSH: 3.0000 mL | INTRAVENOUS | Status: DC | PRN

## 2015-12-04 MED ORDER — GLYCOPYRROLATE 0.2 MG/ML IJ SOLN
INTRAMUSCULAR | Status: DC | PRN
  Administered 2015-12-04: 0.6 mg via INTRAVENOUS

## 2015-12-04 MED ORDER — LIDOCAINE HCL (CARDIAC) 20 MG/ML IV SOLN
INTRAVENOUS | Status: DC | PRN
  Administered 2015-12-04: 30 mg via INTRAVENOUS

## 2015-12-04 MED ORDER — BUPIVACAINE HCL (PF) 0.25 % IJ SOLN
INTRAMUSCULAR | Status: DC | PRN
  Administered 2015-12-04: 50 mL

## 2015-12-04 MED ORDER — HYDROMORPHONE HCL 1 MG/ML IJ SOLN: 0.2500 mg | INTRAMUSCULAR | Status: DC | PRN

## 2015-12-04 MED ORDER — ALPRAZOLAM 0.5 MG PO TABS: 0.5000 mg | ORAL_TABLET | Freq: Every evening | ORAL | Status: DC | PRN

## 2015-12-04 MED ORDER — APIXABAN 5 MG PO TABS
5.0000 mg | ORAL_TABLET | Freq: Two times a day (BID) | ORAL | Status: DC
  Administered 2015-12-04 – 2015-12-05 (×2): 5 mg via ORAL
  Filled 2015-12-04 (×2): qty 1

## 2015-12-04 MED ORDER — NEOSTIGMINE METHYLSULFATE 10 MG/10ML IV SOLN
INTRAVENOUS | Status: DC | PRN
  Administered 2015-12-04: 4 mg via INTRAVENOUS

## 2015-12-04 MED ORDER — HEPARIN SODIUM (PORCINE) 1000 UNIT/ML IJ SOLN
INTRAMUSCULAR | Status: AC
  Filled 2015-12-04: qty 1

## 2015-12-04 MED ORDER — PROPOFOL 10 MG/ML IV BOLUS
INTRAVENOUS | Status: DC | PRN
  Administered 2015-12-04: 170 mg via INTRAVENOUS

## 2015-12-04 MED ORDER — HEPARIN SODIUM (PORCINE) 1000 UNIT/ML IJ SOLN
INTRAMUSCULAR | Status: DC | PRN
  Administered 2015-12-04: 3000 [IU] via INTRAVENOUS
  Administered 2015-12-04: 8000 [IU] via INTRAVENOUS
  Administered 2015-12-04: 2000 [IU] via INTRAVENOUS
  Administered 2015-12-04: 15000 [IU] via INTRAVENOUS
  Administered 2015-12-04: 3000 [IU] via INTRAVENOUS

## 2015-12-04 MED ORDER — DOBUTAMINE IN D5W 4-5 MG/ML-% IV SOLN
INTRAVENOUS | Status: AC
  Filled 2015-12-04: qty 250

## 2015-12-04 MED ORDER — METOPROLOL SUCCINATE ER 50 MG PO TB24
50.0000 mg | ORAL_TABLET | Freq: Every day | ORAL | Status: DC
  Administered 2015-12-04 – 2015-12-05 (×2): 50 mg via ORAL
  Filled 2015-12-04 (×2): qty 1

## 2015-12-04 MED ORDER — PROTAMINE SULFATE 10 MG/ML IV SOLN
INTRAVENOUS | Status: DC | PRN
  Administered 2015-12-04: 40 mg via INTRAVENOUS

## 2015-12-04 MED ORDER — HEPARIN (PORCINE) IN NACL 2-0.9 UNIT/ML-% IJ SOLN
INTRAMUSCULAR | Status: DC | PRN
  Administered 2015-12-04 (×4)

## 2015-12-04 MED ORDER — OXYCODONE HCL 5 MG/5ML PO SOLN: 5.0000 mg | Freq: Once | ORAL | Status: DC | PRN

## 2015-12-04 MED ORDER — OXYCODONE HCL 5 MG PO TABS
5.0000 mg | ORAL_TABLET | Freq: Once | ORAL | Status: AC
  Administered 2015-12-04: 5 mg via ORAL

## 2015-12-04 MED ORDER — BUPIVACAINE HCL (PF) 0.25 % IJ SOLN
INTRAMUSCULAR | Status: AC
  Filled 2015-12-04: qty 60

## 2015-12-04 MED ORDER — ESCITALOPRAM OXALATE 10 MG PO TABS
10.0000 mg | ORAL_TABLET | Freq: Every day | ORAL | Status: DC
  Administered 2015-12-04 – 2015-12-05 (×2): 10 mg via ORAL
  Filled 2015-12-04 (×2): qty 1

## 2015-12-04 MED ORDER — PHENYLEPHRINE HCL 10 MG/ML IJ SOLN
10.0000 mg | INTRAVENOUS | Status: DC | PRN
  Administered 2015-12-04: 25 ug/min via INTRAVENOUS

## 2015-12-04 MED ORDER — ONDANSETRON HCL 4 MG/2ML IJ SOLN
INTRAMUSCULAR | Status: DC | PRN
  Administered 2015-12-04: 4 mg via INTRAVENOUS

## 2015-12-04 MED ORDER — FENTANYL CITRATE (PF) 100 MCG/2ML IJ SOLN
INTRAMUSCULAR | Status: DC | PRN
  Administered 2015-12-04: 100 ug via INTRAVENOUS
  Administered 2015-12-04 (×3): 50 ug via INTRAVENOUS

## 2015-12-04 MED ORDER — LACTATED RINGERS IV SOLN
INTRAVENOUS | Status: DC | PRN
  Administered 2015-12-04 (×2): via INTRAVENOUS

## 2015-12-04 MED ORDER — SODIUM CHLORIDE 0.9 % IV SOLN: 250.0000 mL | INTRAVENOUS | Status: DC | PRN

## 2015-12-04 MED ORDER — NAPHAZOLINE-GLYCERIN 0.012-0.2 % OP SOLN
2.0000 [drp] | Freq: Four times a day (QID) | OPHTHALMIC | Status: DC | PRN
  Filled 2015-12-04 (×2): qty 15

## 2015-12-04 SURGICAL SUPPLY — 19 items
BAG SNAP BAND KOVER 36X36 (MISCELLANEOUS) ×2 IMPLANT
BLANKET WARM UNDERBOD FULL ACC (MISCELLANEOUS) ×2 IMPLANT
CATH NAVISTAR SMARTTOUCH DF (ABLATOR) ×2 IMPLANT
CATH SOUNDSTAR 3D IMAGING (CATHETERS) ×2 IMPLANT
CATH VARIABLE LASSO NAV 2515 (CATHETERS) ×2 IMPLANT
CATH WEBSTER BI DIR CS D-F CRV (CATHETERS) ×2 IMPLANT
COVER SWIFTLINK CONNECTOR (BAG) ×2 IMPLANT
NEEDLE TRANSSEPTAL BRK XS 98CM (SHEATH) ×2 IMPLANT
PACK EP LATEX FREE (CUSTOM PROCEDURE TRAY) ×1
PACK EP LF (CUSTOM PROCEDURE TRAY) ×1 IMPLANT
PAD DEFIB LIFELINK (PAD) ×2 IMPLANT
PATCH CARTO3 (PAD) ×2 IMPLANT
SHEATH AGILIS NXT 8.5F 71CM (SHEATH) ×4 IMPLANT
SHEATH AVANTI 11F 11CM (SHEATH) ×2 IMPLANT
SHEATH PINNACLE 7F 10CM (SHEATH) ×2 IMPLANT
SHEATH PINNACLE 8F 10CM (SHEATH) ×4 IMPLANT
SHEATH PINNACLE 9F 10CM (SHEATH) ×4 IMPLANT
SHIELD RADPAD SCOOP 12X17 (MISCELLANEOUS) ×2 IMPLANT
TUBING SMART ABLATE COOLFLOW (TUBING) ×2 IMPLANT

## 2015-12-04 NOTE — Anesthesia Procedure Notes (Signed)
Procedure Name: Intubation Date/Time: 12/04/2015 11:42 AM Performed by: Gaylene Brooks Pre-anesthesia Checklist: Patient identified, Timeout performed, Emergency Drugs available, Suction available and Patient being monitored Patient Re-evaluated:Patient Re-evaluated prior to inductionPreoxygenation: Pre-oxygenation with 100% oxygen Intubation Type: IV induction Ventilation: Mask ventilation without difficulty Laryngoscope Size: Miller and 2 Grade View: Grade II Tube type: Oral Tube size: 7.0 mm Number of attempts: 1 Airway Equipment and Method: Stylet Placement Confirmation: ETT inserted through vocal cords under direct vision,  breath sounds checked- equal and bilateral,  positive ETCO2 and CO2 detector Secured at: 20 cm Tube secured with: Tape Dental Injury: Teeth and Oropharynx as per pre-operative assessment

## 2015-12-04 NOTE — Transfer of Care (Signed)
Immediate Anesthesia Transfer of Care Note  Patient: Deborah [name]  Procedure(s) Performed: Procedure(s): Atrial Fibrillation Ablation (N/A)  Patient Location: Cath Lab  Anesthesia Type:General  Level of Consciousness: awake, alert  and oriented  Airway & Oxygen Therapy: Patient Spontanous Breathing and Patient connected to face mask oxygen  Post-op Assessment: Report given to RN, Post -op Vital signs reviewed and stable and Patient moving all extremities X 4  Post vital signs: Reviewed and stable  Last Vitals:  Filed Vitals:   12/04/15 0905  BP: 123/80  Pulse: 41  Temp: 36.4 C  Resp: 20    Last Pain: There were no vitals filed for this visit.       Complications: No apparent anesthesia complications

## 2015-12-04 NOTE — Discharge Instructions (Signed)
No driving for 1 week. No lifting over 5 lbs for 1 week. No vigorous orsexual activity for 1 week. You may return to work on 12/11/15. Keep procedure site clean & dry. If you notice increased pain, swelling, bleeding or pus, call/return!  You may shower, but no soaking baths/hot tubs/pools for 1 week.      You have an appointment set up with the [facility].  Multiple studies have shown that being followed by a dedicated atrial fibrillation clinic in addition to the standard care you receive from your other physicians improves health. We believe that enrollment in the atrial fibrillation clinic will allow [place] to better care for you.   The phone number to the [facility] is [phone]. The clinic is staffed Monday through Friday from 8:30am to 5pm.  Parking Directions: The clinic is located in the Heart and Vascular Building connected to [facility]. 1)From [address] turn on to [address] and go to the 3rd entrance  (Heart and Vascular entrance) on the right. 2)Look to the right for Heart &Vascular Parking Garage. 3)A code for the entrance is required please call the clinic to receive this.   4)Take the elevators to the 1st floor. Registration is in the room with the glass walls at the end of the hallway.  If you have any trouble parking or locating the clinic, please dont hesitate to call [phone].

## 2015-12-04 NOTE — Anesthesia Preprocedure Evaluation (Addendum)
Anesthesia Evaluation  Patient identified by MRN, date of birth, ID band Patient awake    Reviewed: Allergy & Precautions, NPO status , Patient's Chart, lab work & pertinent test results  Airway Mallampati: II  TM Distance: >3 FB Neck ROM: Full    Dental  (+) Teeth Intact, Dental Advisory Given   Pulmonary    breath sounds clear to auscultation       Cardiovascular hypertension,  Rhythm:Regular Rate:Normal     Neuro/Psych    GI/Hepatic   Endo/Other    Renal/GU      Musculoskeletal   Abdominal   Peds  Hematology   Anesthesia Other Findings   Reproductive/Obstetrics                            Anesthesia Physical Anesthesia Plan  ASA: III  Anesthesia Plan: General   Post-op Pain Management:    Induction: Inhalational  Airway Management Planned: Oral ETT  Additional Equipment:   Intra-op Plan:   Post-operative Plan: Extubation in OR  Informed Consent: I have reviewed the patients History and Physical, chart, labs and discussed the procedure including the risks, benefits and alternatives for the proposed anesthesia with the patient or authorized representative who has indicated his/her understanding and acceptance.   Dental advisory given  Plan Discussed with: CRNA and Anesthesiologist  Anesthesia Plan Comments:         Anesthesia Quick Evaluation

## 2015-12-04 NOTE — H&P (Signed)
Deborah [name] presents today with atrial fibrillation.  On exam, heart regular, lungs clear, no murmurs.  Presenting for AF ablation.  Has been compliant with anticoagulation.  Risks and benefits explained.  Risks include bleeding, tamponade, heart block, stroke, and damage to surrounding organs.  The patient understands these risks and has agreed to the procedure.  Lonzie Simmer Curt Bears, MD 12/04/2015 9:01 AM

## 2015-12-04 NOTE — Progress Notes (Signed)
ELECTROPHYSIOLOGY PROCEDURE DISCHARGE SUMMARY    Patient ID: Deborah [name],  MRN: [id number], DOB/AGE: 1955-09-23 60 y.o.  Admit date: 12/04/2015 Discharge date: 12/05/15  Primary Care Physician: Simona Huh, MD Primary Cardiologist: Dr. Irish Lack Electrophysiologist: Dr. Curt Bears  Primary Discharge Diagnosis:  1. Paroxysmal Afib     CHA2DS2Vasc is at least 2 on Eliquis     Flecainide and Metoprolol  Secondary Discharge Diagnosis:  1. HTN 2. OSA     On CPAP at HS 3. Obesity  Procedures This Admission:  1.  Electrophysiology study and radiofrequency catheter ablation on 12/04/15 by Dr Curt Bears.  This study demonstrated    CONCLUSIONS: 1. Atrial fibrillation upon presentation.  2. Successful electrical isolation and anatomical encircling of all four pulmonary veins with radiofrequency current. 3. Atrial fibrillation successfully cardioverted to sinus rhythm. 4. Cavo-tricuspid isthmus ablation performed with complete bidirectional isthmus block achieved 5. No inducible arrhythmias following ablation 6. No early apparent complications.  Brief HPI: Deborah [name] is a 60 y.o. female with a history of paroxysmal atrial fibrillation.  They have failed medical therapy with Flecainide. Risks, benefits, and alternatives to catheter ablation of atrial fibrillation were reviewed with the patient who wished to proceed.  The patient underwent TEE prior to the procedure which demonstrated normal LV function and no LAA thrombus.    Hospital Course:  The patient was admitted and underwent EPS/RFCA of atrial fibrillation with details as outlined above.  They were monitored on telemetry overnight which demonstrated SR with APCs.  Groin was without complication on the day of discharge.  The patient was examined by Dr. Curt Bears and considered to be stable for discharge.  Wound care and restrictions were reviewed with the patient.  The patient Deborah [name] be seen back by Roderic Palau, NP in 4 weeks and Dr  Curt Bears in 12 weeks for post ablation follow up. We Deborah [name] decrease her Flecainide in half to 50mg  BID and give her some Clochcine for some pleuritic type chest discomfort.      Physical Exam: Filed Vitals:   12/05/15 0700 12/05/15 0715 12/05/15 0730 12/05/15 0733  BP: 103/71   103/71  Pulse: 63 62 65 68  Temp:    97.7 F (36.5 C)  TempSrc:    Oral  Resp: 10 15 16 13   Height:      Weight:      SpO2: 92% 95% 95% 98%     GEN- The patient is well appearing, alert and oriented x 3 today.   HEENT: normocephalic, atraumatic; sclera clear, conjunctiva pink; hearing intact; oropharynx clear; neck supple  Lungs- Clear to ausculation bilaterally, normal work of breathing.  No wheezes, rales, rhonchi Heart- Regular rate and rhythm, no murmurs, rubs or gallops  GI- soft, non-tender, non-distended, bowel sounds present  Extremities- no clubbing, cyanosis, or edema; DP/PT/radial pulses 2+ bilaterally, groin without hematoma/bruit MS- no significant deformity or atrophy Skin- warm and dry, no rash or lesion Psych- euthymic mood, full affect Neuro- strength and sensation are intact   Labs:   Lab Results  Component Value Date   WBC 7.9 11/25/2015   HGB 13.1 11/25/2015   HCT 40.6 11/25/2015   MCV 98.1 11/25/2015   PLT 347 11/25/2015     Discharge Medications:    Medication List    TAKE these medications        ALPRAZolam 0.5 MG tablet  Commonly known as:  XANAX  Take 0.5 mg by mouth at bedtime as needed for anxiety. Reported on 10/08/2015  apixaban 5 MG Tabs tablet  Commonly known as:  ELIQUIS  Take 1 tablet (5 mg total) by mouth 2 (two) times daily.     colchicine 0.6 MG tablet  Take 1 tablet (0.6 mg total) by mouth daily.  Notes to Patient:  Take this medicine for one month     escitalopram 10 MG tablet  Commonly known as:  LEXAPRO  Take 10 mg by mouth daily.     flecainide 50 MG tablet  Commonly known as:  TAMBOCOR  Take 1 tablet (50 mg total) by mouth 2 (two) times  daily.     metoprolol succinate 100 MG 24 hr tablet  Commonly known as:  TOPROL-XL  Take 50 mg by mouth daily. Take with or immediately following a meal.        Disposition:  Home  Follow-up Information    Follow up with [facility] On 01/05/2016.   Specialty:  Cardiology   Why:  11:30AM   Contact information:   [id number] [place] [phone]      Follow up with Eliaz Fout Meredith Leeds, MD On 03/09/2016.   Specialty:  Cardiology   Why:  4:00PM   Contact information:   [address] [place] [address] [phone]       Duration of Discharge Encounter: Greater than 30 minutes including physician time.  SignedTommye Standard, PA-C  12/05/2015 9:06 AM    I have seen and examined this patient with Tommye Standard.  Agree with above, note added to reflect my findings.  On exam, regular rhythm, no murmurs, lungs clear.  Had ablation yesterday for atrial fibrillation. He tolerated the procedure well. Also had ablation for atrial flutter. In sinus rhythm this morning without major complaint. We'll plan to discharge home with follow-up in clinic.    Makaylia Hewett M. Akul Leggette MD 12/05/2015 2:02 PM

## 2015-12-04 NOTE — Progress Notes (Addendum)
Site area: right groin a 9 french X2 venous sheaths were removed  Site Prior to Removal:  Level 0  Pressure Applied For 20 MINUTES    Bedrest Beginning at 1745p  Manual:   Yes.    Patient Status During Pull:  stable  Post Pull Groin Site:  Level 0  Post Pull Instructions Given:  Yes.    Post Pull Pulses Present:  Yes.    Dressing Applied:  Yes.    Comments:  VS remain stable during sheath pull.

## 2015-12-04 NOTE — Progress Notes (Addendum)
Site area: left groin a 7, and 11 french venous sheath was removed  Site Prior to Removal:  Level 0  Pressure Applied For 20 MINUTES    Bedrest Beginning at 1815p  Manual:   Yes.    Patient Status During Pull:  stable  Post Pull Groin Site:  Level 0  Post Pull Instructions Given:  Yes.    Post Pull Pulses Present:  Yes.    Dressing Applied:  Yes.    Comments:  VS remain stable at this time

## 2015-12-05 ENCOUNTER — Encounter (HOSPITAL_COMMUNITY): Payer: Self-pay | Admitting: Cardiology

## 2015-12-05 DIAGNOSIS — I483 Typical atrial flutter: Secondary | ICD-10-CM | POA: Diagnosis not present

## 2015-12-05 DIAGNOSIS — I481 Persistent atrial fibrillation: Secondary | ICD-10-CM | POA: Diagnosis not present

## 2015-12-05 DIAGNOSIS — I10 Essential (primary) hypertension: Secondary | ICD-10-CM | POA: Diagnosis not present

## 2015-12-05 DIAGNOSIS — G4733 Obstructive sleep apnea (adult) (pediatric): Secondary | ICD-10-CM | POA: Diagnosis not present

## 2015-12-05 MED ORDER — COLCHICINE 0.6 MG PO TABS: 0.6000 mg | ORAL_TABLET | Freq: Every day | ORAL | Status: DC

## 2015-12-05 MED ORDER — FLECAINIDE ACETATE 50 MG PO TABS: 50.0000 mg | ORAL_TABLET | Freq: Two times a day (BID) | ORAL | Status: DC

## 2015-12-05 MED FILL — Heparin Sodium (Porcine) 2 Unit/ML in Sodium Chloride 0.9%: INTRAMUSCULAR | Qty: 1500 | Status: AC

## 2015-12-05 NOTE — Progress Notes (Signed)
DC instructions given to pt at this time.  Pt verbalized understanding of all instructions.  No s/s of any acute distress.  No c/o pain.  NSR.

## 2015-12-05 NOTE — Anesthesia Postprocedure Evaluation (Signed)
Anesthesia Post Note  Patient: Deborah [name]  Procedure(s) Performed: Procedure(s) (LRB): Atrial Fibrillation Ablation (N/A)  Patient location during evaluation: PACU Anesthesia Type: General Level of consciousness: awake Pain management: pain level controlled Vital Signs Assessment: post-procedure vital signs reviewed and stable Respiratory status: spontaneous breathing Cardiovascular status: stable Postop Assessment: no signs of nausea or vomiting Anesthetic complications: no    Last Vitals:  Filed Vitals:   12/05/15 0800 12/05/15 0900  BP: 95/58 97/58  Pulse: 63 64  Temp:    Resp: 14 11    Last Pain:  Filed Vitals:   12/05/15 1039  PainSc: Asleep                 Kaliq Lege

## 2015-12-15 ENCOUNTER — Ambulatory Visit: Payer: 59 | Admitting: Physician Assistant

## 2016-01-05 ENCOUNTER — Ambulatory Visit (HOSPITAL_COMMUNITY)
Admission: RE | Admit: 2016-01-05 | Discharge: 2016-01-05 | Disposition: A | Discharge status: Home / Self Care | Payer: 59 | Source: Ambulatory Visit | Attending: Nurse Practitioner | Admitting: Nurse Practitioner

## 2016-01-05 ENCOUNTER — Inpatient Hospital Stay (HOSPITAL_COMMUNITY): Admit: 2016-01-05 | Payer: 59 | Admitting: Nurse Practitioner

## 2016-01-05 ENCOUNTER — Encounter (HOSPITAL_COMMUNITY): Payer: Self-pay | Admitting: Nurse Practitioner

## 2016-01-05 VITALS — BP 130/82 | HR 79 | Ht 73.0 in | Wt 283.4 lb

## 2016-01-05 DIAGNOSIS — I4891 Unspecified atrial fibrillation: Secondary | ICD-10-CM | POA: Insufficient documentation

## 2016-01-05 DIAGNOSIS — I480 Paroxysmal atrial fibrillation: Secondary | ICD-10-CM | POA: Diagnosis present

## 2016-01-05 DIAGNOSIS — E669 Obesity, unspecified: Secondary | ICD-10-CM | POA: Insufficient documentation

## 2016-01-05 DIAGNOSIS — I481 Persistent atrial fibrillation: Secondary | ICD-10-CM | POA: Diagnosis not present

## 2016-01-05 DIAGNOSIS — R9431 Abnormal electrocardiogram [ECG] [EKG]: Secondary | ICD-10-CM | POA: Diagnosis not present

## 2016-01-05 DIAGNOSIS — Z9889 Other specified postprocedural states: Secondary | ICD-10-CM | POA: Diagnosis not present

## 2016-01-05 DIAGNOSIS — I4510 Unspecified right bundle-branch block: Secondary | ICD-10-CM | POA: Insufficient documentation

## 2016-01-05 DIAGNOSIS — Z7901 Long term (current) use of anticoagulants: Secondary | ICD-10-CM | POA: Insufficient documentation

## 2016-01-05 DIAGNOSIS — G4733 Obstructive sleep apnea (adult) (pediatric): Secondary | ICD-10-CM | POA: Insufficient documentation

## 2016-01-05 DIAGNOSIS — F419 Anxiety disorder, unspecified: Secondary | ICD-10-CM | POA: Insufficient documentation

## 2016-01-05 DIAGNOSIS — I4819 Other persistent atrial fibrillation: Secondary | ICD-10-CM

## 2016-01-05 DIAGNOSIS — I10 Essential (primary) hypertension: Secondary | ICD-10-CM | POA: Insufficient documentation

## 2016-01-05 NOTE — Progress Notes (Signed)
Patient ID: Deborah [name], female   DOB: Oct 01, 1955, 59 y.o.   MRN: [id number]     Primary Care Physician: Simona Huh, MD Referring Physician: Dr. Curt Bears Cardiologist: Dr. Loreli Slot Deborah [name] is a 60 y.o. female with a h/o afib on flecainide for years and afib burden increased. Referred to Dr. Curt Bears and is here one month f/u of afib ablation. Feels well. No noted afib at all. No swallowing or groin issues. Continues on flecainide and apixaban and knows not to miss doses of DOAC.  Today, she denies symptoms of palpitations, chest pain, shortness of breath, orthopnea, PND, lower extremity edema, dizziness, presyncope, syncope, or neurologic sequela. The patient is tolerating medications without difficulties and is otherwise without complaint today.   Past Medical History  Diagnosis Date  . Obesity   . Anxiety   . OSA (obstructive sleep apnea)   . A-fib ([place])   . HTN (hypertension)   . PAF (paroxysmal atrial fibrillation) ([place])     s/p DCCV 2010   Past Surgical History  Procedure Laterality Date  . Btl    . Tonsillectomy and adenoidectomy    . Electrophysiologic study N/A 12/04/2015    Procedure: Atrial Fibrillation Ablation;  Surgeon: Will Meredith Leeds, MD;  Location: [facility];  Service: Cardiovascular;  Laterality: N/A;    Current Outpatient Prescriptions  Medication Sig Dispense Refill  . ALPRAZolam (XANAX) 0.5 MG tablet Take 0.5 mg by mouth at bedtime as needed for anxiety. Reported on 10/08/2015    . apixaban (ELIQUIS) 5 MG TABS tablet Take 1 tablet (5 mg total) by mouth 2 (two) times daily. 60 tablet 6  . escitalopram (LEXAPRO) 10 MG tablet Take 10 mg by mouth daily.  3  . flecainide (TAMBOCOR) 50 MG tablet Take 1 tablet (50 mg total) by mouth 2 (two) times daily. 60 tablet 3  . metoprolol succinate (TOPROL-XL) 100 MG 24 hr tablet Take 50 mg by mouth daily. Take with or immediately following a meal.     No current facility-administered medications for  this encounter.    Allergies  Allergen Reactions  . Benazepril     cough,  . Adhesive [Tape] Rash  . Sulfa Antibiotics Rash    Social History   Social History  . Marital Status: Married    Spouse Name: N/A  . Number of Children: N/A  . Years of Education: N/A   Occupational History  . Not on file.   Social History Main Topics  . Smoking status: Never Smoker   . Smokeless tobacco: Never Used  . Alcohol Use: 0.0 oz/week    0 Standard drinks or equivalent per week     Comment: occasional  . Drug Use: No  . Sexual Activity: Not on file   Other Topics Concern  . Not on file   Social History Narrative    Family History  Problem Relation Age of Onset  . Heart disease Mother   . Colon cancer Neg Hx   . Stomach cancer Paternal Grandfather     ROS- All systems are reviewed and negative except as per the HPI above  Physical Exam: Filed Vitals:   01/05/16 1507  BP: 130/82  Pulse: 79  Height: 6\' 1"  (1.854 m)  Weight: 283 lb 6.4 oz (128.549 kg)    GEN- The patient is well appearing, alert and oriented x 3 today.   Head- normocephalic, atraumatic Eyes-  Sclera clear, conjunctiva pink Ears- hearing intact Oropharynx- clear Neck- supple,  no JVP Lymph- no cervical lymphadenopathy Lungs- Clear to ausculation bilaterally, normal work of breathing Heart- Regular rate and rhythm, no murmurs, rubs or gallops, PMI not laterally displaced GI- soft, NT, ND, + BS Extremities- no clubbing, cyanosis, or edema MS- no significant deformity or atrophy Skin- no rash or lesion Psych- euthymic mood, full affect Neuro- strength and sensation are intact  EKG-NSR with RBBB t wave abmnormality, pr int 168 ms, qrs int 148 ms, qtc 490 ms Epic records reviewed  Assessment and Plan: 1. Afib s/p ablation Doing well s/p ablation, in SR Continue with flecainide, metoprolol, but may be able to be stopped or reduced when she sees Dr. Curt Bears  Continue apixaban for now with chadsvasc  score of 1.  F/u with Dr. Curt Bears as scheduled 9/12.  Geroge Baseman Aneisha Skyles, [facility] [address] [place],  [address] [phone]

## 2016-01-21 ENCOUNTER — Encounter (HOSPITAL_COMMUNITY): Payer: Self-pay

## 2016-02-23 ENCOUNTER — Telehealth: Payer: Self-pay

## 2016-02-23 ENCOUNTER — Encounter: Payer: Self-pay | Admitting: Cardiology

## 2016-02-23 NOTE — Telephone Encounter (Signed)
Pt has a CHADS score of 1 (HTN).  Had Afib ablation on 12/04/15.  Given procedure is >3 months since ablation and pt has a low CHADS score, okay to hold Eliquis x 48 hours prior to total knee arthroplasty.  Will fax clearance back to Dr. French Ana.

## 2016-02-23 NOTE — Telephone Encounter (Signed)
The pt is scheduled to have right total knee replacement on 04/23/16 with Dr [name] with [facility].  The pt is taking Eliquis 5 mg daily and they want to know when the pt can stop taking prior to procedure and for how long.  Please advise.

## 2016-02-27 NOTE — Telephone Encounter (Signed)
This message has been manually faxed to [phone] ATTN: Claiborne Billings. (Faxed from the [place] office and I did not receive a failed message back/[place] office does not receive confirmations).

## 2016-02-27 NOTE — Telephone Encounter (Signed)
Agreed -

## 2016-03-09 ENCOUNTER — Ambulatory Visit (INDEPENDENT_AMBULATORY_CARE_PROVIDER_SITE_OTHER): Payer: 59 | Admitting: Cardiology

## 2016-03-09 ENCOUNTER — Encounter: Payer: Self-pay | Admitting: Cardiology

## 2016-03-09 VITALS — BP 118/82 | HR 74 | Ht 73.0 in | Wt 282.4 lb

## 2016-03-09 DIAGNOSIS — I481 Persistent atrial fibrillation: Secondary | ICD-10-CM | POA: Diagnosis not present

## 2016-03-09 DIAGNOSIS — I4819 Other persistent atrial fibrillation: Secondary | ICD-10-CM

## 2016-03-09 NOTE — Addendum Note (Signed)
Addended by: [name] on: 03/09/2016 06:09 PM   Modules accepted: Orders

## 2016-03-09 NOTE — Patient Instructions (Signed)
Medication Instructions:  Your physician has recommended you make the following change in your medication:  1. STOP Flecainide  * If you need a refill on your cardiac medications before your next appointment, please call your pharmacy.   Labwork: None ordered  Testing/Procedures: None ordered  Follow-Up: Your physician wants you to follow-up in: 6 months with Dr. Camnitz.  You will receive a reminder letter in the mail two months in advance. If you don't receive a letter, please call our office to schedule the follow-up appointment.  Thank you for choosing [facility]!!   Mahek Schlesinger, RN [phone]       

## 2016-03-09 NOTE — Progress Notes (Addendum)
Electrophysiology Office Note   Date:  03/09/2016   ID:  Deborah [name], DOB 1956/04/03, MRN [id number]  PCP:  Simona Huh, MD  Cardiologist:  Irish Lack Primary Electrophysiologist:  Constance Haw, MD    Chief Complaint  Patient presents with  . Follow-up    PAF     History of Present Illness: Deborah [name] is a 60 y.o. female who presents today for electrophysiology evaluation.   She has a history of paroxysmal atrial fibrillation.  Ablation 12/04/15. She says that she feels well without any major complaints. She has not had any further episodes of palpitations.she is planned for right knee replacement in October.  Today, she denies symptoms of chest pain, orthopnea, PND, lower extremity edema, claudication, dizziness, presyncope, syncope, bleeding, or neurologic sequela. The patient is tolerating medications without difficulties and is otherwise without complaint today.    Past Medical History:  Diagnosis Date  . A-fib ([place])   . Anxiety   . HTN (hypertension)   . Obesity   . OSA (obstructive sleep apnea)   . PAF (paroxysmal atrial fibrillation) ([place])    s/p DCCV 2010   Past Surgical History:  Procedure Laterality Date  . BTL    . ELECTROPHYSIOLOGIC STUDY N/A 12/04/2015   Procedure: Atrial Fibrillation Ablation;  Surgeon: Shelda Truby Meredith Leeds, MD;  Location: [facility];  Service: Cardiovascular;  Laterality: N/A;  . TONSILLECTOMY AND ADENOIDECTOMY       Current Outpatient Prescriptions  Medication Sig Dispense Refill  . ALPRAZolam (XANAX) 0.5 MG tablet Take 0.5 mg by mouth at bedtime as needed for anxiety. Reported on 10/08/2015    . apixaban (ELIQUIS) 5 MG TABS tablet Take 1 tablet (5 mg total) by mouth 2 (two) times daily. 60 tablet 6  . escitalopram (LEXAPRO) 10 MG tablet Take 10 mg by mouth daily.  3  . flecainide (TAMBOCOR) 50 MG tablet Take 1 tablet (50 mg total) by mouth 2 (two) times daily. 60 tablet 3  . metoprolol succinate (TOPROL-XL) 100 MG 24  hr tablet Take 50 mg by mouth daily. Take with or immediately following a meal.     No current facility-administered medications for this visit.     Allergies:   Benazepril; Adhesive [tape]; and Sulfa antibiotics   Social History:  The patient  reports that she has never smoked. She has never used smokeless tobacco. She reports that she drinks alcohol. She reports that she does not use drugs.   Family History:  The patient's family history includes Heart disease in her mother; Stomach cancer in her paternal grandfather.    ROS:  Please see the history of present illness.   Otherwise, review of systems is positive for anxiety.   All other systems are reviewed and negative.    PHYSICAL EXAM: VS:  BP 118/82   Pulse 74   Ht 6\' 1"  (1.854 m)   Wt 282 lb 6.4 oz (128.1 kg)   BMI 37.26 kg/m  , BMI Body mass index is 37.26 kg/m. GEN: Well nourished, well developed, in no acute distress  HEENT: normal  Neck: no JVD, carotid bruits, or masses Cardiac: RRR; no murmurs, rubs, or gallops,no edema  Respiratory:  clear to auscultation bilaterally, normal work of breathing GI: soft, nontender, nondistended, + BS MS: no deformity or atrophy  Skin: warm and dry Neuro:  Strength and sensation are intact Psych: euthymic mood, full affect  EKG:  EKG is ordered today.Personal review of the ECG ordered shows sinus  rhythm, RBBB   Recent Labs: 11/25/2015: BUN 14; Creat 0.92; Hemoglobin 13.1; Platelets 347; Potassium 4.6; Sodium 144    Lipid Panel  No results found for: CHOL, TRIG, HDL, CHOLHDL, VLDL, LDLCALC, LDLDIRECT   Wt Readings from Last 3 Encounters:  03/09/16 282 lb 6.4 oz (128.1 kg)  01/05/16 283 lb 6.4 oz (128.5 kg)  12/04/15 289 lb (131.1 kg)      Other studies Reviewed: Additional studies/ records that were reviewed today include: TTE pending   ASSESSMENT AND PLAN:  1.  Persistent atrial fibrillation: Patient presents today in atrial fibrillation. She also has a history of  atrial flutter.  AF ablation 12/04/15. In sinus rhythm today without episodes of palpitations. We Donald Jacque stop her flecainide today.  This patients CHA2DS2-VASc Score and unadjusted Ischemic Stroke Rate (% per year) is equal to 2.2 % stroke rate/year from a score of 2  Above score calculated as 1 point each if present [CHF, HTN, DM, Vascular=MI/PAD/Aortic Plaque, Age if 65-74, or Female] Above score calculated as 2 points each if present [Age > 75, or Stroke/TIA/TE]   2. Hypertension: well controlled  3. Pre-op evaluation: plan for right knee replacement. At this point, can stop her anticoagulation around the time of her knee replacement. Would continue her Toprol-XL throughout the periprocedural time. She would be at intermediate risk for an intermediate risk procedure.    Current medicines are reviewed at length with the patient today.   The patient does not have concerns regarding her medicines.  The following changes were made today:  Stop flecainide  Labs/ tests ordered today include:  No orders of the defined types were placed in this encounter.    Disposition:   FU with Aristeo Hankerson 6 months  Signed, Skylee Baird Meredith Leeds, MD  03/09/2016 4:25 PM     [address] [place] [address] [phone] (office) [phone] (fax)

## 2016-04-06 ENCOUNTER — Ambulatory Visit: Payer: Self-pay | Admitting: Physician Assistant

## 2016-04-06 MED ORDER — TRANEXAMIC ACID 1000 MG/10ML IV SOLN: 1000.0000 mg | INTRAVENOUS | Status: DC

## 2016-04-06 NOTE — H&P (Signed)
TOTAL KNEE ADMISSION H&P  Patient is being admitted for right total knee arthroplasty.  Subjective:  Chief Complaint:right knee pain.  HPI: Deborah [name], 60 y.o. female, has a history of pain and functional disability in the right knee due to arthritis and has failed non-surgical conservative treatments for greater than 12 weeks to includeNSAID's and/or analgesics, corticosteriod injections, viscosupplementation injections, supervised PT with diminished ADL's post treatment, use of assistive devices and activity modification.  Onset of symptoms was gradual, starting 5 years ago with gradually worsening course since that time. The patient noted no past surgery on the right knee(s).  Patient currently rates pain in the right knee(s) at 10 out of 10 with activity. Patient has night pain, worsening of pain with activity and weight bearing, pain that interferes with activities of daily living, pain with passive range of motion, crepitus and joint swelling.  Patient has evidence of periarticular osteophytes and joint space narrowing by imaging studies.There is no active infection.  Patient Active Problem List   Diagnosis Date Noted  . AF (atrial fibrillation) ([place]) 12/04/2015  . HTN (hypertension)   . PAF (paroxysmal atrial fibrillation) ([place])   . OSA (obstructive sleep apnea)   . Obesity    Past Medical History:  Diagnosis Date  . A-fib ([place])   . Anxiety   . HTN (hypertension)   . Obesity   . OSA (obstructive sleep apnea)   . PAF (paroxysmal atrial fibrillation) ([place])    s/p DCCV 2010    Past Surgical History:  Procedure Laterality Date  . BTL    . ELECTROPHYSIOLOGIC STUDY N/A 12/04/2015   Procedure: Atrial Fibrillation Ablation;  Surgeon: Will Meredith Leeds, MD;  Location: [facility];  Service: Cardiovascular;  Laterality: N/A;  . TONSILLECTOMY AND ADENOIDECTOMY       (Not in a hospital admission) Allergies  Allergen Reactions  . Benazepril     cough,  . Adhesive [Tape]  Rash  . Sulfa Antibiotics Rash    Social History  Substance Use Topics  . Smoking status: Never Smoker  . Smokeless tobacco: Never Used  . Alcohol use 0.0 oz/week     Comment: occasional    Family History  Problem Relation Age of Onset  . Heart disease Mother   . Stomach cancer Paternal Grandfather   . Colon cancer Neg Hx      Review of Systems  Cardiovascular: Positive for chest pain and leg swelling.  Gastrointestinal: Positive for constipation and diarrhea.  Genitourinary: Positive for frequency.  Skin: Positive for rash.  Psychiatric/Behavioral: The patient is nervous/anxious.     Objective:  Physical Exam  Constitutional: She is oriented to person, place, and time. She appears well-developed and well-nourished. No distress.  HENT:  Head: Normocephalic and atraumatic.  Nose: Nose normal.  Eyes: Conjunctivae and EOM are normal. Pupils are equal, round, and reactive to light.  Neck: Normal range of motion. Neck supple.  Cardiovascular: Normal rate, regular rhythm, normal heart sounds and intact distal pulses.   Respiratory: Effort normal and breath sounds normal. No respiratory distress. She has no wheezes.  GI: Soft. Bowel sounds are normal. She exhibits no distension. There is no tenderness.  Musculoskeletal:       Right knee: She exhibits decreased range of motion and swelling. She exhibits no effusion. Tenderness found.  Lymphadenopathy:    She has no cervical adenopathy.  Neurological: She is alert and oriented to person, place, and time. No cranial nerve deficit.  Skin: Skin is warm  and dry. No rash noted. No erythema.  Psychiatric: She has a normal mood and affect. Her behavior is normal.    Vital signs in last 24 hours: @VSRANGES @  Labs:   Estimated body mass index is 37.26 kg/m as calculated from the following:   Height as of 03/09/16: 6\' 1"  (1.854 m).   Weight as of 03/09/16: 128.1 kg (282 lb 6.4 oz).   Imaging Review Plain radiographs demonstrate  severe degenerative joint disease of the right knee(s). The overall alignment isneutral. The bone quality appears to be good for age and reported activity level.  Assessment/Plan:  End stage arthritis, right knee   The patient history, physical examination, clinical judgment of the provider and imaging studies are consistent with end stage degenerative joint disease of the right knee(s) and total knee arthroplasty is deemed medically necessary. The treatment options including medical management, injection therapy arthroscopy and arthroplasty were discussed at length. The risks and benefits of total knee arthroplasty were presented and reviewed. The risks due to aseptic loosening, infection, stiffness, patella tracking problems, thromboembolic complications and other imponderables were discussed. The patient acknowledged the explanation, agreed to proceed with the plan and consent was signed. Patient is being admitted for inpatient treatment for surgery, pain control, PT, OT, prophylactic antibiotics, VTE prophylaxis, progressive ambulation and ADL's and discharge planning. The patient is planning to be discharged home with home health services

## 2016-04-12 ENCOUNTER — Encounter (HOSPITAL_COMMUNITY)
Admission: RE | Admit: 2016-04-12 | Discharge: 2016-04-12 | Disposition: A | Discharge status: Home / Self Care | Payer: 59 | Source: Ambulatory Visit | Attending: Orthopedic Surgery | Admitting: Orthopedic Surgery

## 2016-04-12 ENCOUNTER — Encounter (HOSPITAL_COMMUNITY): Payer: Self-pay

## 2016-04-12 ENCOUNTER — Ambulatory Visit (HOSPITAL_COMMUNITY)
Admission: RE | Admit: 2016-04-12 | Discharge: 2016-04-12 | Disposition: A | Discharge status: Home / Self Care | Payer: 59 | Source: Ambulatory Visit | Attending: Physician Assistant | Admitting: Physician Assistant

## 2016-04-12 DIAGNOSIS — Z01818 Encounter for other preprocedural examination: Secondary | ICD-10-CM | POA: Diagnosis not present

## 2016-04-12 DIAGNOSIS — M1711 Unilateral primary osteoarthritis, right knee: Secondary | ICD-10-CM

## 2016-04-12 HISTORY — DX: Gastro-esophageal reflux disease without esophagitis: K21.9

## 2016-04-12 HISTORY — DX: Anemia, unspecified: D64.9

## 2016-04-12 HISTORY — DX: Unspecified osteoarthritis, unspecified site: M19.90

## 2016-04-12 HISTORY — DX: Cardiac arrhythmia, unspecified: I49.9

## 2016-04-12 HISTORY — DX: Peripheral vascular disease, unspecified: I73.9

## 2016-04-12 LAB — COMPREHENSIVE METABOLIC PANEL WITH GFR
ALT: 17 U/L (ref 14–54)
AST: 26 U/L (ref 15–41)
Albumin: 4.1 g/dL (ref 3.5–5.0)
Alkaline Phosphatase: 96 U/L (ref 38–126)
Anion gap: 6 (ref 5–15)
BUN: 10 mg/dL (ref 6–20)
CO2: 29 mmol/L (ref 22–32)
Calcium: 9.4 mg/dL (ref 8.9–10.3)
Chloride: 105 mmol/L (ref 101–111)
Creatinine, Ser: 1 mg/dL (ref 0.44–1.00)
GFR calc Af Amer: >60 mL/min
GFR calc non Af Amer: 60 mL/min — ABNORMAL LOW
Glucose, Bld: 90 mg/dL (ref 65–99)
Potassium: 4 mmol/L (ref 3.5–5.1)
Sodium: 140 mmol/L (ref 135–145)
Total Bilirubin: 0.8 mg/dL (ref 0.3–1.2)
Total Protein: 6.9 g/dL (ref 6.5–8.1)

## 2016-04-12 LAB — URINALYSIS, ROUTINE W REFLEX MICROSCOPIC
Glucose, UA: NEGATIVE mg/dL
Ketones, ur: NEGATIVE mg/dL
Leukocytes, UA: NEGATIVE
Nitrite: NEGATIVE
PH: 5 (ref 5.0–8.0)
Protein, ur: NEGATIVE mg/dL
Specific Gravity, Urine: 1.025 (ref 1.005–1.030)

## 2016-04-12 LAB — URINE MICROSCOPIC-ADD ON

## 2016-04-12 LAB — CBC WITH DIFFERENTIAL/PLATELET
Basophils Absolute: 0.1 10*3/uL (ref 0.0–0.1)
Basophils Relative: 1 %
Eosinophils Absolute: 0.2 10*3/uL (ref 0.0–0.7)
Eosinophils Relative: 3 %
HCT: 40.5 % (ref 36.0–46.0)
Hemoglobin: 13.5 g/dL (ref 12.0–15.0)
Lymphocytes Relative: 35 %
Lymphs Abs: 2.7 10*3/uL (ref 0.7–4.0)
MCH: 39 pg — ABNORMAL HIGH (ref 26.0–34.0)
MCHC: 33.3 g/dL (ref 30.0–36.0)
MCV: 117.1 fL — ABNORMAL HIGH (ref 78.0–100.0)
Monocytes Absolute: 0.6 10*3/uL (ref 0.1–1.0)
Monocytes Relative: 8 %
Neutro Abs: 4.2 10*3/uL (ref 1.7–7.7)
Neutrophils Relative %: 53 %
Platelets: 274 10*3/uL (ref 150–400)
RBC: 3.46 MIL/uL — ABNORMAL LOW (ref 3.87–5.11)
RDW: 15.2 % (ref 11.5–15.5)
WBC: 7.8 10*3/uL (ref 4.0–10.5)

## 2016-04-12 LAB — PROTIME-INR
INR: 1.23
Prothrombin Time: 15.5 s — ABNORMAL HIGH (ref 11.4–15.2)

## 2016-04-12 LAB — TYPE AND SCREEN
ABO/RH(D): O NEG
Antibody Screen: NEGATIVE

## 2016-04-12 LAB — SURGICAL PCR SCREEN
MRSA, PCR: NEGATIVE
STAPHYLOCOCCUS AUREUS: NEGATIVE

## 2016-04-12 LAB — ABO/RH: ABO/RH(D): O NEG

## 2016-04-12 LAB — APTT: APTT: 33 s (ref 24–36)

## 2016-04-12 NOTE — Pre-Procedure Instructions (Addendum)
ELDORA WEATHERS  04/12/2016      [id number] - [place] [place] - [address] [place] [address] Phone: [phone] Fax: [phone]    Your procedure is scheduled on 04/23/16.  Report to [facility] Admitting at 850 A.M.  Call this number if you have problems the morning of surgery:  [phone]   Remember:  Do not eat food or drink liquids after midnight.  Take these medicines the morning of surgery with A SIP OF WATER xanax if needed,   STOP all herbel meds, nsaids (aleve,naproxen,advil,ibuprofen) Including aspirin, all vitamins starting 04/16/09  eliquis per dr 04/21/16   Do not wear jewelry, make-up or nail polish.  Do not wear lotions, powders, or perfumes, or deoderant.  Do not shave 48 hours prior to surgery.  Men may shave face and neck.  Do not bring valuables to the hospital.  [facility] is not responsible for any belongings or valuables.  Contacts, dentures or bridgework may not be worn into surgery.  Leave your suitcase in the car.  After surgery it may be brought to your room.  For patients admitted to the hospital, discharge time will be determined by your treatment team.  Patients discharged the day of surgery will not be allowed to drive home.   Name and phone number of your driver:    Special instructions:   Special Instructions: [facility] - Preparing for Surgery  Before surgery, you can play an important role.  Because skin is not sterile, your skin needs to be as free of germs as possible.  You can reduce the number of germs on you skin by washing with CHG (chlorahexidine gluconate) soap before surgery.  CHG is an antiseptic cleaner which kills germs and bonds with the skin to continue killing germs even after washing.  Please DO NOT use if you have an allergy to CHG or antibacterial soaps.  If your skin becomes reddened/irritated stop using the CHG and inform your nurse when you arrive at Short Stay.  Do  not shave (including legs and underarms) for at least 48 hours prior to the first CHG shower.  You may shave your face.  Please follow these instructions carefully:   1.  Shower with CHG Soap the night before surgery and the morning of Surgery.  2.  If you choose to wash your hair, wash your hair first as usual with your normal shampoo.  3.  After you shampoo, rinse your hair and body thoroughly to remove the Shampoo.  4.  Use CHG as you would any other liquid soap.  You can apply chg directly  to the skin and wash gently with scrungie or a clean washcloth.  5.  Apply the CHG Soap to your body ONLY FROM THE NECK DOWN.  Do not use on open wounds or open sores.  Avoid contact with your eyes ears, mouth and genitals (private parts).  Wash genitals (private parts)       with your normal soap.  6.  Wash thoroughly, paying special attention to the area where your surgery will be performed.  7.  Thoroughly rinse your body with warm water from the neck down.  8.  DO NOT shower/wash with your normal soap after using and rinsing off the CHG Soap.  9.  Pat yourself dry with a clean towel.            10.  Wear clean pajamas.  11.  Place clean sheets on your bed the night of your first shower and do not sleep with pets.  Day of Surgery  Do not apply any lotions/deodorants the morning of surgery.  Please wear clean clothes to the hospital/surgery center.  Please read over the  fact sheets that you were given.

## 2016-04-12 NOTE — Progress Notes (Addendum)
Anesthesia Chart Review:  Pt is a 60 year old female scheduled for R total knee arthroplasty on 04/23/2016 with [name], MD.   - Cardiologist is Larae Grooms, MD who is aware of upcoming surgery - EP cardiologist is Will Curt Bears, MD who cleared pt for surgery at intermediate risk at last office visit 03/09/16.  - PCP is Gaynelle Arabian, MD.   [facility] includes:  Persistent atrial fibrillation (s/p successful ablation 11/2015), HTN, OVD, OSA, anemia, GERD.  Never smoker. BMI 37  Medications include: eliquis, metoprolol. I reached out to Dr. Irish Lack about eliquis and was given permission to have pt hold it for 3 days pre-op.  I notified pt by telephone.   Preoperative labs reviewed.    EKG 03/09/16: NSR. RBBB  Echo 11/25/15:  - Left ventricle: Systolic function was normal. The estimated ejection fraction was in the range of 50% to 55%. The study is not technically sufficient to allow evaluation of LV diastolic function. - Left atrium: The appendage was moderately dilated. - Atrial septum: No defect or patent foramen ovale was identified.  If no changes, I anticipate pt can proceed with surgery as scheduled.   Willeen Cass, FNP-BC [facility]/Anesthesiology Phone: [phone] 04/13/2016 4:20 PM

## 2016-04-13 LAB — URINE CULTURE

## 2016-04-22 MED ORDER — CEFAZOLIN SODIUM 10 G IJ SOLR
3.0000 g | INTRAMUSCULAR | Status: AC
  Administered 2016-04-23: 3 g via INTRAVENOUS
  Filled 2016-04-22: qty 3000

## 2016-04-23 ENCOUNTER — Inpatient Hospital Stay (HOSPITAL_COMMUNITY): Payer: 59 | Admitting: Anesthesiology

## 2016-04-23 ENCOUNTER — Encounter (HOSPITAL_COMMUNITY): Payer: Self-pay | Admitting: Anesthesiology

## 2016-04-23 ENCOUNTER — Encounter (HOSPITAL_COMMUNITY)
Admission: RE | Disposition: A | Discharge status: Home / Self Care | Payer: Self-pay | Source: Ambulatory Visit | Attending: Orthopedic Surgery

## 2016-04-23 ENCOUNTER — Inpatient Hospital Stay (HOSPITAL_COMMUNITY)
Admission: RE | Admit: 2016-04-23 | Discharge: 2016-04-25 | DRG: 470 | Disposition: A | Discharge status: Home / Self Care | Payer: 59 | Source: Ambulatory Visit | Attending: Orthopedic Surgery | Admitting: Orthopedic Surgery

## 2016-04-23 ENCOUNTER — Inpatient Hospital Stay (HOSPITAL_COMMUNITY): Payer: 59 | Admitting: Emergency Medicine

## 2016-04-23 DIAGNOSIS — I10 Essential (primary) hypertension: Secondary | ICD-10-CM | POA: Diagnosis present

## 2016-04-23 DIAGNOSIS — I480 Paroxysmal atrial fibrillation: Secondary | ICD-10-CM | POA: Diagnosis present

## 2016-04-23 DIAGNOSIS — M1711 Unilateral primary osteoarthritis, right knee: Principal | ICD-10-CM | POA: Diagnosis present

## 2016-04-23 DIAGNOSIS — E669 Obesity, unspecified: Secondary | ICD-10-CM | POA: Diagnosis present

## 2016-04-23 DIAGNOSIS — K219 Gastro-esophageal reflux disease without esophagitis: Secondary | ICD-10-CM | POA: Diagnosis present

## 2016-04-23 DIAGNOSIS — G4733 Obstructive sleep apnea (adult) (pediatric): Secondary | ICD-10-CM | POA: Diagnosis present

## 2016-04-23 DIAGNOSIS — I739 Peripheral vascular disease, unspecified: Secondary | ICD-10-CM | POA: Diagnosis present

## 2016-04-23 DIAGNOSIS — F419 Anxiety disorder, unspecified: Secondary | ICD-10-CM | POA: Diagnosis present

## 2016-04-23 DIAGNOSIS — R262 Difficulty in walking, not elsewhere classified: Secondary | ICD-10-CM

## 2016-04-23 HISTORY — PX: TOTAL KNEE ARTHROPLASTY: SHX125

## 2016-04-23 SURGERY — ARTHROPLASTY, KNEE, TOTAL
Anesthesia: Monitor Anesthesia Care | Laterality: Right

## 2016-04-23 MED ORDER — POLYETHYLENE GLYCOL 3350 17 G PO PACK: 17.0000 g | PACK | Freq: Every day | ORAL | Status: DC | PRN

## 2016-04-23 MED ORDER — METOCLOPRAMIDE HCL 5 MG PO TABS: 5.0000 mg | ORAL_TABLET | Freq: Three times a day (TID) | ORAL | Status: DC | PRN

## 2016-04-23 MED ORDER — HYDROMORPHONE HCL 1 MG/ML IJ SOLN: 0.2500 mg | INTRAMUSCULAR | Status: DC | PRN

## 2016-04-23 MED ORDER — DIPHENHYDRAMINE HCL 12.5 MG/5ML PO ELIX: 12.5000 mg | ORAL_SOLUTION | ORAL | Status: DC | PRN

## 2016-04-23 MED ORDER — ONDANSETRON HCL 4 MG PO TABS: 4.0000 mg | ORAL_TABLET | Freq: Four times a day (QID) | ORAL | Status: DC | PRN

## 2016-04-23 MED ORDER — PROPOFOL 10 MG/ML IV BOLUS
INTRAVENOUS | Status: AC
  Filled 2016-04-23: qty 20

## 2016-04-23 MED ORDER — METHOCARBAMOL 500 MG PO TABS
500.0000 mg | ORAL_TABLET | Freq: Four times a day (QID) | ORAL | Status: DC | PRN
  Administered 2016-04-23 – 2016-04-25 (×4): 500 mg via ORAL
  Filled 2016-04-23 (×5): qty 1

## 2016-04-23 MED ORDER — APIXABAN 5 MG PO TABS
5.0000 mg | ORAL_TABLET | Freq: Two times a day (BID) | ORAL | Status: DC
  Administered 2016-04-24 (×2): 5 mg via ORAL
  Filled 2016-04-23 (×3): qty 1

## 2016-04-23 MED ORDER — BUPIVACAINE LIPOSOME 1.3 % IJ SUSP
20.0000 mL | INTRAMUSCULAR | Status: DC
  Filled 2016-04-23: qty 20

## 2016-04-23 MED ORDER — MIDAZOLAM HCL 2 MG/2ML IJ SOLN
INTRAMUSCULAR | Status: AC
  Administered 2016-04-23: 1.5 mg
  Filled 2016-04-23: qty 2

## 2016-04-23 MED ORDER — MENTHOL 3 MG MT LOZG
1.0000 | LOZENGE | OROMUCOSAL | Status: DC | PRN
  Administered 2016-04-24: 3 mg via ORAL
  Filled 2016-04-23: qty 9

## 2016-04-23 MED ORDER — OXYCODONE HCL 5 MG/5ML PO SOLN: 5.0000 mg | Freq: Once | ORAL | Status: DC | PRN

## 2016-04-23 MED ORDER — PHENYLEPHRINE HCL 10 MG/ML IJ SOLN
INTRAVENOUS | Status: DC | PRN
  Administered 2016-04-23 (×2): 40 ug/min via INTRAVENOUS

## 2016-04-23 MED ORDER — HYDROMORPHONE HCL 2 MG/ML IJ SOLN
1.0000 mg | INTRAMUSCULAR | Status: DC | PRN
  Administered 2016-04-23 (×2): 1 mg via INTRAVENOUS
  Filled 2016-04-23 (×2): qty 1

## 2016-04-23 MED ORDER — LACTATED RINGERS IV SOLN
INTRAVENOUS | Status: DC
  Administered 2016-04-23 (×2): via INTRAVENOUS
  Administered 2016-04-23: 50 mL/h via INTRAVENOUS

## 2016-04-23 MED ORDER — FLEET ENEMA 7-19 GM/118ML RE ENEM: 1.0000 | ENEMA | Freq: Once | RECTAL | Status: DC | PRN

## 2016-04-23 MED ORDER — ACETAMINOPHEN 325 MG PO TABS: 650.0000 mg | ORAL_TABLET | Freq: Four times a day (QID) | ORAL | Status: DC | PRN

## 2016-04-23 MED ORDER — PROPOFOL 500 MG/50ML IV EMUL
INTRAVENOUS | Status: DC | PRN
  Administered 2016-04-23: 120 ug/kg/min via INTRAVENOUS

## 2016-04-23 MED ORDER — CEFAZOLIN SODIUM-DEXTROSE 2-4 GM/100ML-% IV SOLN
2.0000 g | Freq: Four times a day (QID) | INTRAVENOUS | Status: AC
  Administered 2016-04-23 (×2): 2 g via INTRAVENOUS
  Filled 2016-04-23 (×2): qty 100

## 2016-04-23 MED ORDER — FENTANYL CITRATE (PF) 100 MCG/2ML IJ SOLN
INTRAMUSCULAR | Status: DC | PRN
  Administered 2016-04-23: 100 ug via INTRAVENOUS

## 2016-04-23 MED ORDER — TRANEXAMIC ACID 1000 MG/10ML IV SOLN
INTRAVENOUS | Status: DC | PRN
  Administered 2016-04-23: 2000 mg via TOPICAL

## 2016-04-23 MED ORDER — TRANEXAMIC ACID 1000 MG/10ML IV SOLN
2000.0000 mg | Freq: Once | INTRAVENOUS | Status: DC
  Filled 2016-04-23: qty 20

## 2016-04-23 MED ORDER — DOCUSATE SODIUM 100 MG PO CAPS
100.0000 mg | ORAL_CAPSULE | Freq: Two times a day (BID) | ORAL | Status: DC
  Administered 2016-04-23 – 2016-04-24 (×4): 100 mg via ORAL
  Filled 2016-04-23 (×5): qty 1

## 2016-04-23 MED ORDER — METOCLOPRAMIDE HCL 5 MG/ML IJ SOLN: 5.0000 mg | Freq: Three times a day (TID) | INTRAMUSCULAR | Status: DC | PRN

## 2016-04-23 MED ORDER — ONDANSETRON HCL 4 MG/2ML IJ SOLN: 4.0000 mg | Freq: Four times a day (QID) | INTRAMUSCULAR | Status: DC | PRN

## 2016-04-23 MED ORDER — ESCITALOPRAM OXALATE 10 MG PO TABS
10.0000 mg | ORAL_TABLET | Freq: Every evening | ORAL | Status: DC
  Administered 2016-04-23 – 2016-04-24 (×2): 10 mg via ORAL
  Filled 2016-04-23 (×2): qty 1

## 2016-04-23 MED ORDER — OXYCODONE HCL 5 MG PO TABS: 5.0000 mg | ORAL_TABLET | Freq: Once | ORAL | Status: DC | PRN

## 2016-04-23 MED ORDER — FENTANYL CITRATE (PF) 100 MCG/2ML IJ SOLN
INTRAMUSCULAR | Status: AC
  Filled 2016-04-23: qty 2

## 2016-04-23 MED ORDER — OXYCODONE HCL 5 MG PO TABS
5.0000 mg | ORAL_TABLET | ORAL | Status: DC | PRN
  Administered 2016-04-24 (×2): 10 mg via ORAL
  Administered 2016-04-24: 5 mg via ORAL
  Administered 2016-04-25 (×2): 10 mg via ORAL
  Filled 2016-04-23 (×3): qty 2
  Filled 2016-04-23: qty 1
  Filled 2016-04-23 (×2): qty 2

## 2016-04-23 MED ORDER — SODIUM CHLORIDE 0.9 % IR SOLN
Status: DC | PRN
  Administered 2016-04-23: 1000 mL

## 2016-04-23 MED ORDER — OXYCODONE-ACETAMINOPHEN 5-325 MG PO TABS: 1.0000 | ORAL_TABLET | ORAL | 0 refills | Status: DC | PRN

## 2016-04-23 MED ORDER — METHOCARBAMOL 1000 MG/10ML IJ SOLN
500.0000 mg | Freq: Four times a day (QID) | INTRAMUSCULAR | Status: DC | PRN
  Filled 2016-04-23: qty 5

## 2016-04-23 MED ORDER — PHENOL 1.4 % MT LIQD: 1.0000 | OROMUCOSAL | Status: DC | PRN

## 2016-04-23 MED ORDER — FENTANYL CITRATE (PF) 100 MCG/2ML IJ SOLN
INTRAMUSCULAR | Status: AC
  Administered 2016-04-23: 75 ug
  Filled 2016-04-23: qty 2

## 2016-04-23 MED ORDER — SODIUM CHLORIDE 0.9% FLUSH
INTRAVENOUS | Status: DC | PRN
  Administered 2016-04-23: 50 mL

## 2016-04-23 MED ORDER — BUPIVACAINE HCL (PF) 0.5 % IJ SOLN
INTRAMUSCULAR | Status: AC
  Filled 2016-04-23: qty 60

## 2016-04-23 MED ORDER — METOPROLOL SUCCINATE ER 50 MG PO TB24
50.0000 mg | ORAL_TABLET | Freq: Every day | ORAL | Status: DC
  Administered 2016-04-24: 50 mg via ORAL
  Filled 2016-04-23 (×4): qty 1

## 2016-04-23 MED ORDER — SODIUM CHLORIDE 0.9 % IV SOLN
INTRAVENOUS | Status: DC
  Administered 2016-04-23: 17:00:00 via INTRAVENOUS

## 2016-04-23 MED ORDER — MIDAZOLAM HCL 5 MG/5ML IJ SOLN
INTRAMUSCULAR | Status: DC | PRN
  Administered 2016-04-23: 2 mg via INTRAVENOUS

## 2016-04-23 MED ORDER — BUPIVACAINE-EPINEPHRINE (PF) 0.5% -1:200000 IJ SOLN
INTRAMUSCULAR | Status: DC | PRN
  Administered 2016-04-23: 25 mL via PERINEURAL

## 2016-04-23 MED ORDER — MIDAZOLAM HCL 2 MG/2ML IJ SOLN
INTRAMUSCULAR | Status: AC
Start: 2016-04-23 — End: 2016-04-23
  Filled 2016-04-23: qty 2

## 2016-04-23 MED ORDER — SODIUM CHLORIDE 0.9 % IV SOLN
1000.0000 mg | INTRAVENOUS | Status: AC
  Administered 2016-04-23: 1000 mg via INTRAVENOUS
  Filled 2016-04-23: qty 10

## 2016-04-23 MED ORDER — ACETAMINOPHEN 650 MG RE SUPP: 650.0000 mg | Freq: Four times a day (QID) | RECTAL | Status: DC | PRN

## 2016-04-23 MED ORDER — BISACODYL 5 MG PO TBEC: 5.0000 mg | DELAYED_RELEASE_TABLET | Freq: Every day | ORAL | Status: DC | PRN

## 2016-04-23 MED ORDER — PHENYLEPHRINE HCL 10 MG/ML IJ SOLN
INTRAMUSCULAR | Status: DC | PRN
  Administered 2016-04-23: 80 ug via INTRAVENOUS

## 2016-04-23 MED ORDER — BUPIVACAINE HCL (PF) 0.5 % IJ SOLN
INTRAMUSCULAR | Status: DC | PRN
  Administered 2016-04-23: 50 mL

## 2016-04-23 MED ORDER — BUPIVACAINE LIPOSOME 1.3 % IJ SUSP
INTRAMUSCULAR | Status: DC | PRN
  Administered 2016-04-23: 20 mL

## 2016-04-23 MED ORDER — ONDANSETRON HCL 4 MG/2ML IJ SOLN
INTRAMUSCULAR | Status: DC | PRN
  Administered 2016-04-23: 4 mg via INTRAVENOUS

## 2016-04-23 MED ORDER — ALPRAZOLAM 0.5 MG PO TABS: 0.5000 mg | ORAL_TABLET | Freq: Every day | ORAL | Status: DC | PRN

## 2016-04-23 SURGICAL SUPPLY — 62 items
BANDAGE ACE 4X5 VEL STRL LF (GAUZE/BANDAGES/DRESSINGS) ×2 IMPLANT
BANDAGE ACE 6X5 VEL STRL LF (GAUZE/BANDAGES/DRESSINGS) ×2 IMPLANT
BANDAGE ESMARK 6X9 LF (GAUZE/BANDAGES/DRESSINGS) ×1 IMPLANT
BLADE SAGITTAL 25.0X1.19X90 (BLADE) ×2 IMPLANT
BLADE SAW SAG 90X13X1.27 (BLADE) ×2 IMPLANT
BNDG ESMARK 6X9 LF (GAUZE/BANDAGES/DRESSINGS) ×2
BOWL SMART MIX CTS (DISPOSABLE) ×2 IMPLANT
CAP KNEE TOTAL 3 SIGMA ×2 IMPLANT
CEMENT HV SMART SET (Cement) ×4 IMPLANT
COVER SURGICAL LIGHT HANDLE (MISCELLANEOUS) ×2 IMPLANT
CUFF TOURNIQUET SINGLE 34IN LL (TOURNIQUET CUFF) ×2 IMPLANT
CUFF TOURNIQUET SINGLE 44IN (TOURNIQUET CUFF) IMPLANT
DRAPE INCISE IOBAN 66X45 STRL (DRAPES) IMPLANT
DRAPE ORTHO SPLIT 77X108 STRL (DRAPES) ×2
DRAPE SURG ORHT 6 SPLT 77X108 (DRAPES) ×2 IMPLANT
DRAPE U-SHAPE 47X51 STRL (DRAPES) ×2 IMPLANT
DRSG ADAPTIC 3X8 NADH LF (GAUZE/BANDAGES/DRESSINGS) ×2 IMPLANT
DRSG PAD ABDOMINAL 8X10 ST (GAUZE/BANDAGES/DRESSINGS) ×2 IMPLANT
DURAPREP 26ML APPLICATOR (WOUND CARE) ×2 IMPLANT
ELECT REM PT RETURN 9FT ADLT (ELECTROSURGICAL) ×2
ELECTRODE REM PT RTRN 9FT ADLT (ELECTROSURGICAL) ×1 IMPLANT
EVACUATOR 1/8 PVC DRAIN (DRAIN) IMPLANT
FACESHIELD WRAPAROUND (MASK) ×4 IMPLANT
FLOSEAL 10ML (HEMOSTASIS) IMPLANT
GAUZE SPONGE 4X4 12PLY STRL (GAUZE/BANDAGES/DRESSINGS) ×2 IMPLANT
GLOVE BIOGEL PI IND STRL 8 (GLOVE) ×4 IMPLANT
GLOVE BIOGEL PI INDICATOR 8 (GLOVE) ×4
GLOVE ORTHO TXT STRL SZ7.5 (GLOVE) ×2 IMPLANT
GLOVE SURG ORTHO 8.0 STRL STRW (GLOVE) ×2 IMPLANT
GOWN STRL REUS W/ TWL LRG LVL3 (GOWN DISPOSABLE) ×2 IMPLANT
GOWN STRL REUS W/ TWL XL LVL3 (GOWN DISPOSABLE) ×1 IMPLANT
GOWN STRL REUS W/TWL 2XL LVL3 (GOWN DISPOSABLE) ×2 IMPLANT
GOWN STRL REUS W/TWL LRG LVL3 (GOWN DISPOSABLE) ×2
GOWN STRL REUS W/TWL XL LVL3 (GOWN DISPOSABLE) ×1
HANDPIECE INTERPULSE COAX TIP (DISPOSABLE) ×1
HOOD PEEL AWAY FACE SHEILD DIS (HOOD) ×2 IMPLANT
IMMOBILIZER KNEE 22 UNIV (SOFTGOODS) IMPLANT
KIT BASIN OR (CUSTOM PROCEDURE TRAY) ×2 IMPLANT
KIT ROOM TURNOVER OR (KITS) ×2 IMPLANT
MANIFOLD NEPTUNE II (INSTRUMENTS) ×2 IMPLANT
NEEDLE 22X1 1/2 (OR ONLY) (NEEDLE) ×4 IMPLANT
NS IRRIG 1000ML POUR BTL (IV SOLUTION) ×2 IMPLANT
PACK TOTAL JOINT (CUSTOM PROCEDURE TRAY) ×2 IMPLANT
PAD ARMBOARD 7.5X6 YLW CONV (MISCELLANEOUS) ×4 IMPLANT
PAD CAST 4YDX4 CTTN HI CHSV (CAST SUPPLIES) ×1 IMPLANT
PADDING CAST COTTON 4X4 STRL (CAST SUPPLIES) ×1
PADDING CAST COTTON 6X4 STRL (CAST SUPPLIES) ×2 IMPLANT
SET HNDPC FAN SPRY TIP SCT (DISPOSABLE) ×1 IMPLANT
SPONGE GAUZE 4X4 12PLY STER LF (GAUZE/BANDAGES/DRESSINGS) ×2 IMPLANT
STAPLER VISISTAT 35W (STAPLE) ×2 IMPLANT
SUCTION FRAZIER HANDLE 10FR (MISCELLANEOUS) ×1
SUCTION TUBE FRAZIER 10FR DISP (MISCELLANEOUS) ×1 IMPLANT
SUT ETHIBOND NAB CT1 #1 30IN (SUTURE) ×12 IMPLANT
SUT VIC AB 0 CT1 27 (SUTURE) ×2
SUT VIC AB 0 CT1 27XBRD ANBCTR (SUTURE) ×2 IMPLANT
SUT VIC AB 2-0 CT1 27 (SUTURE) ×4
SUT VIC AB 2-0 CT1 TAPERPNT 27 (SUTURE) ×4 IMPLANT
SYR CONTROL 10ML LL (SYRINGE) ×4 IMPLANT
TOWEL OR 17X24 6PK STRL BLUE (TOWEL DISPOSABLE) ×2 IMPLANT
TOWEL OR 17X26 10 PK STRL BLUE (TOWEL DISPOSABLE) ×2 IMPLANT
TRAY FOLEY CATH 16FRSI W/METER (SET/KITS/TRAYS/PACK) IMPLANT
WATER STERILE IRR 1000ML POUR (IV SOLUTION) ×2 IMPLANT

## 2016-04-23 NOTE — Anesthesia Procedure Notes (Signed)
Anesthesia Regional Block:  Adductor canal block  Pre-Anesthetic Checklist: ,, timeout performed, Correct Patient, Correct Site, Correct Laterality, Correct Procedure, Correct Position, site marked, Risks and benefits discussed,  Surgical consent,  Pre-op evaluation,  At surgeon's request and post-op pain management  Laterality: Right  Prep: chloraprep       Needles:  Injection technique: Single-shot  Needle Type: Echogenic Needle     Needle Length: 9cm 9 cm Needle Gauge: 21 and 21 G    Additional Needles:  Procedures: ultrasound guided (picture in chart) Adductor canal block Narrative:  Start time: 04/23/2016 10:23 AM End time: 04/23/2016 10:31 AM Injection made incrementally with aspirations every 5 mL.  Performed by: Personally  Anesthesiologist: Rigo Letts  Additional Notes: Pt tolerated the procedure well.

## 2016-04-23 NOTE — Progress Notes (Signed)
Orthopedic Tech Progress Note Patient Details:  Deborah [name] 05-28-1956 [id number]  CPM Right Knee CPM Right Knee: On Right Knee Flexion (Degrees): 90 Right Knee Extension (Degrees): 0 Additional Comments: [employer], OHF   [name] 04/23/2016, 2:57 PM

## 2016-04-23 NOTE — Anesthesia Procedure Notes (Signed)
Spinal  Patient location during procedure: OR Start time: 04/23/2016 11:33 AM End time: 04/23/2016 11:40 AM Staffing Anesthesiologist: Marcie Bal, Chayanne Speir Performed: anesthesiologist  Preanesthetic Checklist Completed: patient identified, site marked, surgical consent, pre-op evaluation, timeout performed, IV checked, risks and benefits discussed and monitors and equipment checked Spinal Block Patient position: sitting Prep: Betadine and site prepped and draped Patient monitoring: heart rate, cardiac monitor, continuous pulse ox and blood pressure Approach: midline Location: L3-4 Injection technique: single-shot Needle Needle type: Pencan  Needle gauge: 24 G Needle length: 9 cm Assessment Sensory level: T6 Additional Notes Pt tolerated the procedure well.

## 2016-04-23 NOTE — Anesthesia Preprocedure Evaluation (Signed)
Anesthesia Evaluation  Patient identified by MRN, date of birth, ID band Patient awake    Reviewed: Allergy & Precautions, H&P , NPO status , Patient's Chart, lab work & pertinent test results  Airway Mallampati: II   Neck ROM: full    Dental   Pulmonary sleep apnea ,    breath sounds clear to auscultation       Cardiovascular hypertension, + Peripheral Vascular Disease  + dysrhythmias Atrial Fibrillation  Rhythm:regular Rate:Normal  Last dose of eliquis was taken on Monday 04/19/16.   Neuro/Psych PSYCHIATRIC DISORDERS Anxiety    GI/Hepatic GERD  ,  Endo/Other    Renal/GU      Musculoskeletal  (+) Arthritis ,   Abdominal   Peds  Hematology   Anesthesia Other Findings   Reproductive/Obstetrics                             Anesthesia Physical Anesthesia Plan  ASA: III  Anesthesia Plan: MAC, Spinal and Regional   Post-op Pain Management:  Regional for Post-op pain   Induction: Intravenous  Airway Management Planned: Simple Face Mask  Additional Equipment:   Intra-op Plan:   Post-operative Plan:   Informed Consent: I have reviewed the patients History and Physical, chart, labs and discussed the procedure including the risks, benefits and alternatives for the proposed anesthesia with the patient or authorized representative who has indicated his/her understanding and acceptance.     Plan Discussed with: CRNA, Anesthesiologist and Surgeon  Anesthesia Plan Comments:         Anesthesia Quick Evaluation

## 2016-04-23 NOTE — Discharge Instructions (Signed)

## 2016-04-23 NOTE — H&P (View-Only) (Signed)
TOTAL KNEE ADMISSION H&P  Patient is being admitted for right total knee arthroplasty.  Subjective:  Chief Complaint:right knee pain.  HPI: Deborah [name], 60 y.o. female, has a history of pain and functional disability in the right knee due to arthritis and has failed non-surgical conservative treatments for greater than 12 weeks to includeNSAID's and/or analgesics, corticosteriod injections, viscosupplementation injections, supervised PT with diminished ADL's post treatment, use of assistive devices and activity modification.  Onset of symptoms was gradual, starting 5 years ago with gradually worsening course since that time. The patient noted no past surgery on the right knee(s).  Patient currently rates pain in the right knee(s) at 10 out of 10 with activity. Patient has night pain, worsening of pain with activity and weight bearing, pain that interferes with activities of daily living, pain with passive range of motion, crepitus and joint swelling.  Patient has evidence of periarticular osteophytes and joint space narrowing by imaging studies.There is no active infection.  Patient Active Problem List   Diagnosis Date Noted  . AF (atrial fibrillation) ([place]) 12/04/2015  . HTN (hypertension)   . PAF (paroxysmal atrial fibrillation) ([place])   . OSA (obstructive sleep apnea)   . Obesity    Past Medical History:  Diagnosis Date  . A-fib ([place])   . Anxiety   . HTN (hypertension)   . Obesity   . OSA (obstructive sleep apnea)   . PAF (paroxysmal atrial fibrillation) ([place])    s/p DCCV 2010    Past Surgical History:  Procedure Laterality Date  . BTL    . ELECTROPHYSIOLOGIC STUDY N/A 12/04/2015   Procedure: Atrial Fibrillation Ablation;  Surgeon: Will Meredith Leeds, MD;  Location: [facility];  Service: Cardiovascular;  Laterality: N/A;  . TONSILLECTOMY AND ADENOIDECTOMY       (Not in a hospital admission) Allergies  Allergen Reactions  . Benazepril     cough,  . Adhesive [Tape]  Rash  . Sulfa Antibiotics Rash    Social History  Substance Use Topics  . Smoking status: Never Smoker  . Smokeless tobacco: Never Used  . Alcohol use 0.0 oz/week     Comment: occasional    Family History  Problem Relation Age of Onset  . Heart disease Mother   . Stomach cancer Paternal Grandfather   . Colon cancer Neg Hx      Review of Systems  Cardiovascular: Positive for chest pain and leg swelling.  Gastrointestinal: Positive for constipation and diarrhea.  Genitourinary: Positive for frequency.  Skin: Positive for rash.  Psychiatric/Behavioral: The patient is nervous/anxious.     Objective:  Physical Exam  Constitutional: She is oriented to person, place, and time. She appears well-developed and well-nourished. No distress.  HENT:  Head: Normocephalic and atraumatic.  Nose: Nose normal.  Eyes: Conjunctivae and EOM are normal. Pupils are equal, round, and reactive to light.  Neck: Normal range of motion. Neck supple.  Cardiovascular: Normal rate, regular rhythm, normal heart sounds and intact distal pulses.   Respiratory: Effort normal and breath sounds normal. No respiratory distress. She has no wheezes.  GI: Soft. Bowel sounds are normal. She exhibits no distension. There is no tenderness.  Musculoskeletal:       Right knee: She exhibits decreased range of motion and swelling. She exhibits no effusion. Tenderness found.  Lymphadenopathy:    She has no cervical adenopathy.  Neurological: She is alert and oriented to person, place, and time. No cranial nerve deficit.  Skin: Skin is warm  and dry. No rash noted. No erythema.  Psychiatric: She has a normal mood and affect. Her behavior is normal.    Vital signs in last 24 hours: @VSRANGES @  Labs:   Estimated body mass index is 37.26 kg/m as calculated from the following:   Height as of 03/09/16: 6\' 1"  (1.854 m).   Weight as of 03/09/16: 128.1 kg (282 lb 6.4 oz).   Imaging Review Plain radiographs demonstrate  severe degenerative joint disease of the right knee(s). The overall alignment isneutral. The bone quality appears to be good for age and reported activity level.  Assessment/Plan:  End stage arthritis, right knee   The patient history, physical examination, clinical judgment of the provider and imaging studies are consistent with end stage degenerative joint disease of the right knee(s) and total knee arthroplasty is deemed medically necessary. The treatment options including medical management, injection therapy arthroscopy and arthroplasty were discussed at length. The risks and benefits of total knee arthroplasty were presented and reviewed. The risks due to aseptic loosening, infection, stiffness, patella tracking problems, thromboembolic complications and other imponderables were discussed. The patient acknowledged the explanation, agreed to proceed with the plan and consent was signed. Patient is being admitted for inpatient treatment for surgery, pain control, PT, OT, prophylactic antibiotics, VTE prophylaxis, progressive ambulation and ADL's and discharge planning. The patient is planning to be discharged home with home health services

## 2016-04-23 NOTE — Interval H&P Note (Signed)
History and Physical Interval Note:  04/23/2016 9:33 AM  Deborah [name]  has presented today for surgery, with the diagnosis of OA RIGHT KNEE  The various methods of treatment have been discussed with the patient and family. After consideration of risks, benefits and other options for treatment, the patient has consented to  Procedure(s): TOTAL KNEE ARTHROPLASTY (Right) as a surgical intervention .  The patient's history has been reviewed, patient examined, no change in status, stable for surgery.  I have reviewed the patient's chart and labs.  Questions were answered to the patient's satisfaction.     [name],W D

## 2016-04-23 NOTE — Transfer of Care (Signed)
Immediate Anesthesia Transfer of Care Note  Patient: Deborah [name]  Procedure(s) Performed: Procedure(s): TOTAL KNEE ARTHROPLASTY (Right)  Patient Location: PACU  Anesthesia Type:MAC, Regional and Spinal  Level of Consciousness: awake and alert   Airway & Oxygen Therapy: Patient Spontanous Breathing and Patient connected to face mask oxygen  Post-op Assessment: Report given to RN and Post -op Vital signs reviewed and stable  Post vital signs: Reviewed and stable  Last Vitals:  Vitals:   04/23/16 0902  BP: 134/78  Pulse: 72  Resp: 18  Temp: 36.6 C    Last Pain:  Vitals:   04/23/16 0902  TempSrc: Oral      Patients Stated Pain Goal: 6 ([id number])  Complications: No apparent anesthesia complications

## 2016-04-23 NOTE — Brief Op Note (Signed)
04/23/2016  2:08 PM  PATIENT:  Deborah [name]  60 y.o. female  PRE-OPERATIVE DIAGNOSIS:  OA RIGHT KNEE  POST-OPERATIVE DIAGNOSIS:  OA RIGHT KNEE  PROCEDURE:  Procedure(s): TOTAL KNEE ARTHROPLASTY (Right)  SURGEON:  Surgeon(s) and Role:    * [name], MD - Primary  PHYSICIAN ASSISTANT: Chriss Czar, PA-C  ASSISTANTS:   ANESTHESIA:   local, regional, spinal and IV sedation  EBL:  Total I/O In: 1700 [I.V.:1700] Out: 450 [Urine:300; Blood:150]  BLOOD ADMINISTERED:none  DRAINS: none   LOCAL MEDICATIONS USED:  MARCAINE     SPECIMEN:  No Specimen  DISPOSITION OF SPECIMEN:  N/A  COUNTS:  YES  TOURNIQUET:   Total Tourniquet Time Documented: Thigh (Right) - 81 minutes Total: Thigh (Right) - 81 minutes   DICTATION: .Other Dictation: Dictation Number unknown  PLAN OF CARE: Admit to inpatient   PATIENT DISPOSITION:  PACU - hemodynamically stable.   Delay start of Pharmacological VTE agent (>24hrs) due to surgical blood loss or risk of bleeding: yes

## 2016-04-24 LAB — BASIC METABOLIC PANEL
Anion gap: 8 (ref 5–15)
BUN: 9 mg/dL (ref 6–20)
CHLORIDE: 103 mmol/L (ref 101–111)
CO2: 28 mmol/L (ref 22–32)
Calcium: 8.6 mg/dL — ABNORMAL LOW (ref 8.9–10.3)
Creatinine, Ser: 1.01 mg/dL — ABNORMAL HIGH (ref 0.44–1.00)
GFR calc Af Amer: >60 mL/min (ref >60)
GFR calc non Af Amer: 59 mL/min — ABNORMAL LOW (ref >60)
GLUCOSE: 115 mg/dL — AB (ref 65–99)
POTASSIUM: 4.1 mmol/L (ref 3.5–5.1)
SODIUM: 139 mmol/L (ref 135–145)

## 2016-04-24 LAB — CBC
HEMATOCRIT: 37.4 % (ref 36.0–46.0)
HEMOGLOBIN: 12.1 g/dL (ref 12.0–15.0)
MCH: 38.3 pg — AB (ref 26.0–34.0)
MCHC: 32.4 g/dL (ref 30.0–36.0)
MCV: 118.4 fL — AB (ref 78.0–100.0)
Platelets: 229 10*3/uL (ref 150–400)
RBC: 3.16 MIL/uL — AB (ref 3.87–5.11)
RDW: 14.8 % (ref 11.5–15.5)
WBC: 8 10*3/uL (ref 4.0–10.5)

## 2016-04-24 NOTE — Op Note (Signed)
NAMEJATINDER, GEMBERLING NO.:  [id number]  MEDICAL RECORD NO.:  [id number]  LOCATION:  [id number]                        FACILITY:  [facility]  PHYSICIAN:  Lockie Pares, M.D.    DATE OF BIRTH:  13-Jun-1956  DATE OF PROCEDURE:  04/23/2016 DATE OF DISCHARGE:                              OPERATIVE REPORT   PREOPERATIVE DIAGNOSIS:  Severe osteoarthritis of right knee with flexion contracture, valgus deformity.  POSTOPERATIVE DIAGNOSIS:  Severe osteoarthritis of right knee with flexion contracture, valgus deformity.  OPERATION:  Right total knee replacement (Sigma size 4 femur, tibia, 10 mm bearing with 8 mm all-poly patella).  SURGEON:  Lockie Pares, M.D.  ASSISTANT:  Chriss Czar, PA-C.  ANESTHESIA:  Spinal with a nerve block.  TOURNIQUET TIME:  80 minutes.  DESCRIPTION OF PROCEDURE:  Supine position, exsanguination of leg, inflation of tourniquet to 375.  Straight skin incision with a medial parapatellar approach on the knee was made.  The patient had a relatively significant valgus deformity with lateral patellar wear and valgus inclination, the knee with a flexion contracture, cut 11 mm 5- degree valgus cut on the femur, followed by cutting about 2 to 3 mm below the most diseased lateral compartment.  Extension gap measured at 10 mm.  Initially sized the femur to be a size 5, but on completion of the trials, we could not really reduce the knee, particularly on the lateral side indicating some excessive posterior-lateral tightness. Therefore, we did downsize to a size 4 femur.  Cutting the anterior- posterior and chamfer cuts.  Flexion gap equaled the extension gap of 10 mm.  Nyoka Lint was cut for the tibia.  Trial tibial base plate was placed. Box cut for the femur.  The femoral trials were placed.  Again, as mentioned, we downsized to the 4 after not being able to reduce with the 5 femur.  Patella showed significant wear, particularly on the lateral side.  We  tried to leave 14-15 mm of native patella, but cut that would resect bone patella was somewhat thin due to that fact.  The trial patella button was placed with the trial femur patella.  The patient had a 15-degree flexion contracture, where she had 0 degree extension, excellent stability to varus and valgus, negative Drawer, no tendency for bearing spin out.  The final components were inserted with cement in the doughy state.  We elect to use a trial bearing with the cement hardened.  Exparel and Marcaine were infiltrated into the capsule and subcutaneous tissues.  Cement was allowed to harden.  Excess cement was removed from the posterior aspect of the knee.  The total tourniquet was released.  The inferior-lateral geniculate was needed to be coagulated. Small bleeders were coagulated.  Final bearing was placed, 10 mm bearing for the 4 femur and tibia.  All parameters deemed to be acceptable.  Closure was affected with #1 Ethibond, 0 and 2-0 Vicryl, skin clips on the skin.  Lightly compressive sterile dressing, knee immobilizer applied, taken to recovery room in stable condition.     Lockie Pares, M.D.     WDC/MEDQ  D:  04/23/2016  T:  04/24/2016  Job:  [id number] 

## 2016-04-24 NOTE — Progress Notes (Signed)
Placed patient on CPAP per home settings at 10cm with  oxygen set at 2lpm

## 2016-04-24 NOTE — Progress Notes (Signed)
Physical Therapy Evaluation Patient Details Name: Deborah [name] MRN: [id number] DOB: 1955/12/28 Today's Date: 04/24/2016   History of Present Illness  Pt is a 60 y.o. female s/p right total knee arthroplasty. Pt has a past medical history significant for atrial fibrillation, hypertension, paroxysmal atrial fibrillation, obstructive sleep apnea and obesity.  Clinical Impression  Patient reports significant pain, but motivated for recovery.  Demonstrates good knee flexion, lacking full extension today, patient states knee was not straight prior to surgery.  MIN/MOD assist with slower movements for bed mobility and transfers due to pain with weight bearing.  Gait 100' slowly, with knee immobilizer, no stairs on evaluation.  10 steps to enter home, need to practice prior to DC home.  Patient is appropriate for PT services, anticipate rapid recovery.    Follow Up Recommendations Home health PT;Outpatient PT;Supervision for mobility/OOB (Patient states, home with Home health then outpatient PT.)    Equipment Recommendations   (To be determined, ?crutches vs cane for stairs, RW)    Recommendations for Other Services OT consult     Precautions / Restrictions Precautions Precautions: Knee Precaution Booklet Issued: Yes (comment) Precaution Comments: Review and demonstration of Total knee exercises Required Braces or Orthoses: Knee Immobilizer - Right Knee Immobilizer - Right: On when out of bed or walking Restrictions Weight Bearing Restrictions: Yes RLE Weight Bearing: Weight bearing as tolerated      Mobility  Bed Mobility Overal bed mobility: Needs Assistance Bed Mobility: Supine to Sit;Sit to Supine     Supine to sit: Min assist Sit to supine: Min assist   General bed mobility comments: Min assist to move R LE, use of bed rails, use of trapeze bar.  Transfers Overall transfer level: Needs assistance Equipment used: Rolling walker (2 wheeled) Transfers: Sit to/from Stand Sit  to Stand: Min assist;From elevated surface;Mod assist (Mod assist from bed when elevated, min assist from [facility].)            Ambulation/Gait Ambulation/Gait assistance: Min guard Ambulation Distance (Feet): 100 Feet Assistive device: Rolling walker (2 wheeled) Gait Pattern/deviations: Antalgic;Step-to pattern;Wide base of support     General Gait Details: Patient request defer stairs training today.  Stairs            Wheelchair Mobility    Modified Rankin (Stroke Patients Only)       Balance Overall balance assessment: Needs assistance Sitting-balance support: Single extremity supported;Feet supported Sitting balance-Leahy Scale: Fair     Standing balance support: Single extremity supported;During functional activity Standing balance-Leahy Scale: Poor Standing balance comment: Able to remove single UE from RW for oral care at sink but required min assist to maintain balance and leaning against counter for support.                             Pertinent Vitals/Pain Pain Assessment: 0-10 Pain Score: 7  Faces Pain Scale: Hurts whole lot Pain Location: R knee Pain Descriptors / Indicators: Aching;Operative site guarding Pain Intervention(s): Monitored during session;Patient requesting pain meds-RN notified;Limited activity within patient's tolerance    Home Living Family/patient expects to be discharged to:: Private residence Living Arrangements: Spouse/significant other;Children Available Help at Discharge: Family Type of Home: House Home Access: Stairs to enter Entrance Stairs-Rails: Right;Left (Too far apart, cannot reach both) Entrance Stairs-Number of Steps: 10 Home Layout: Two level;Able to live on main level with bedroom/bathroom Home Equipment: Gilford Rile - 2 wheels;Cane - single point;Crutches;Shower seat;Toilet riser;Wheelchair - manual;Hand held shower  head      Prior Function Level of Independence: Needs assistance      ADL's / Homemaking  Assistance Needed: Pt required min assist for donning/doffing socks and shoes.        Hand Dominance   Dominant Hand: Right    Extremity/Trunk Assessment   Upper Extremity Assessment: Overall WFL for tasks assessed;Defer to OT evaluation           Lower Extremity Assessment: RLE deficits/detail RLE Deficits / Details: Decreased strength and ROM as expected post-operatively.       Communication   Communication: No difficulties  Cognition Arousal/Alertness: Awake/alert Behavior During Therapy: WFL for tasks assessed/performed Overall Cognitive Status: Within Functional Limits for tasks assessed                      General Comments      Exercises Total Joint Exercises Ankle Circles/Pumps: AROM;Both;10 reps;Supine Quad Sets: AROM;Both;10 reps;Supine Hip ABduction/ADduction: AAROM;Right;10 reps;Supine Straight Leg Raises: AAROM;Right;15 reps;Supine Long Arc Quad: AAROM;5 reps;Seated Knee Flexion: AAROM;Right;5 reps;Seated Goniometric ROM: 10-80   Assessment/Plan    PT Assessment Patient needs continued PT services  PT Problem List Decreased strength;Decreased range of motion;Decreased activity tolerance;Decreased mobility;Pain          PT Treatment Interventions Gait training;Stair training;DME instruction;Functional mobility training;Therapeutic activities;Therapeutic exercise;Patient/family education;Manual techniques    PT Goals (Current goals can be found in the Care Plan section)  Acute Rehab PT Goals Patient Stated Goal: be more mobile PT Goal Formulation: With patient Time For Goal Achievement: 05/08/16 Potential to Achieve Goals: Good    Frequency 7X/week   Barriers to discharge Inaccessible home environment 10 steps to enter home    Co-evaluation               End of Session Equipment Utilized During Treatment: Gait belt;Right knee immobilizer Activity Tolerance: Patient tolerated treatment well Patient left: in bed;with call  bell/phone within reach Nurse Communication: Mobility status         Time: 1420-1500 PT Time Calculation (min) (ACUTE ONLY): 40 min   Charges:   PT Evaluation $PT Eval Low Complexity: 1 Procedure PT Treatments $Gait Training: 8-22 mins $Therapeutic Exercise: 8-22 mins   PT G CodesZenia Resides, Yaffa Seckman L 10-May-2016, 3:18 PM

## 2016-04-24 NOTE — Progress Notes (Signed)
Orthopedic Tech Progress Note Patient Details:  Deborah [name] [birth year] [id number] Put patient in CPM at 1550 CPM Right Knee CPM Right Knee: On Right Knee Flexion (Degrees): 60 Right Knee Extension (Degrees): 0 Additional Comments: [employer], OHF   [name] 04/24/2016, 4:31 PM

## 2016-04-24 NOTE — Progress Notes (Signed)
Patient ID: JOREE KINZLER, female   DOB: March 04, 1956, 60 y.o.   MRN: [id number]     Subjective:  Patient reports pain as mild to moderate.  Patient in bed and no acute distress   Objective:   VITALS:   Vitals:   04/23/16 1946 04/24/16 0008 04/24/16 0038 04/24/16 0453  BP: 127/67 120/73  113/67  Pulse: 77 78 81 87  Resp:   18   Temp: 99.3 F (37.4 C) 99 F (37.2 C)  99 F (37.2 C)  TempSrc: Oral Axillary  Oral  SpO2: 100% 99% 95% 94%  Weight:      Height:        ABD soft Sensation intact distally Dorsiflexion/Plantar flexion intact Incision: dressing C/D/I and no drainage Good foot and ankle motion  Lab Results  Component Value Date   WBC 8.0 04/24/2016   HGB 12.1 04/24/2016   HCT 37.4 04/24/2016   MCV 118.4 (H) 04/24/2016   PLT 229 04/24/2016   BMET    Component Value Date/Time   NA 139 04/24/2016 0334   K 4.1 04/24/2016 0334   CL 103 04/24/2016 0334   CO2 28 04/24/2016 0334   GLUCOSE 115 (H) 04/24/2016 0334   BUN 9 04/24/2016 0334   CREATININE 1.01 (H) 04/24/2016 0334   CREATININE 0.92 11/25/2015 1344   CALCIUM 8.6 (L) 04/24/2016 0334   GFRNONAA 59 (L) 04/24/2016 0334   GFRAA >60 04/24/2016 0334     Assessment/Plan: 1 Day Post-Op   Active Problems:   Primary localized osteoarthritis of right knee   Advance diet Up with therapy Plan for discharge tomorrow WBAT Dry dressing PRN   Remonia Richter 04/24/2016, 10:26 AM  Discussed and agree with above.  Marchia Bond, MD Cell [phone]

## 2016-04-24 NOTE — Evaluation (Signed)
Occupational Therapy Evaluation Patient Details Name: Deborah [name] MRN: [id number] DOB: April 14, 1956 Today's Date: 04/24/2016    History of Present Illness Pt is a 60 y.o. female s/p right total knee arthroplasty. Pt has a past medical history significant for atrial fibrillation, hypertension, paroxysmal atrial fibrillation, obstructive sleep apnea and obesity.   Clinical Impression   PTA, pt required min assist for LB dressing and was unable to don/doff shoes and socks. Additionally, pt required wheelchair for community mobility during shopping secondary to pain. Pt currently requires mod assist for functional ambulation and max assist for LB ADL. Pt and husband educated on knee precautions during ADL, safe use of DME, and safe toilet transfers this visit. They verbalize understanding and would benefit from continued reinforcement. Pt plans to D/C tomorrow. Recommend D/C home with husband and son with no OT follow-up and 24 hour assistance.  Pt would benefit from continued OT services while admitted to improve independence with ADL and functional mobility. OT will continue to follow with focus on tub transfers and LB ADL.    Follow Up Recommendations  No OT follow up;Supervision/Assistance - 24 hour          Precautions / Restrictions Precautions Precautions: Knee Precaution Booklet Issued: No Precaution Comments: Reviewed knee precautions during ADL and use of knee immobilizer when ambulating. Required Braces or Orthoses: Knee Immobilizer - Right Knee Immobilizer - Right: On when out of bed or walking Restrictions Weight Bearing Restrictions: Yes RLE Weight Bearing: Weight bearing as tolerated      Mobility Bed Mobility Overal bed mobility: Needs Assistance Bed Mobility: Supine to Sit     Supine to sit: Min assist     General bed mobility comments: Min assist to move R LE, use of bed rails, use of trapeze bar.  Transfers Overall transfer level: Needs assistance Equipment  used: Rolling walker (2 wheeled) Transfers: Sit to/from Stand Sit to Stand: Min assist;Mod assist;From elevated surface (Mod assist from bed when elevated, min assist from [facility].)              Balance Overall balance assessment: Needs assistance Sitting-balance support: Single extremity supported;Feet supported Sitting balance-Leahy Scale: Fair     Standing balance support: Single extremity supported;During functional activity Standing balance-Leahy Scale: Poor Standing balance comment: Able to remove single UE from RW for oral care at sink but required min assist to maintain balance and leaning against counter for support.                            ADL Overall ADL's : Needs assistance/impaired Eating/Feeding: Set up;Sitting   Grooming: Oral care;Standing;Minimal assistance   Upper Body Bathing: Set up;Sitting   Lower Body Bathing: Maximal assistance;Sit to/from stand   Upper Body Dressing : Set up;Sitting   Lower Body Dressing: Maximal assistance;Sit to/from stand   Toilet Transfer: Moderate assistance;Ambulation;BSC   Toileting- Clothing Manipulation and Hygiene: Minimal assistance;Sit to/from stand       Functional mobility during ADLs: Moderate assistance;Rolling walker General ADL Comments: Educated pt on knee precautions during ADL, no pillow under knee, safe toilet transfers, and safety during grooming tasks.     Vision Vision Assessment?: No apparent visual deficits          Pertinent Vitals/Pain Pain Assessment: Faces Faces Pain Scale: Hurts whole lot Pain Location: R knee Pain Descriptors / Indicators: Aching;Operative site guarding;Sore Pain Intervention(s): Limited activity within patient's tolerance;Monitored during session;Repositioned     Hand Dominance  Right   Extremity/Trunk Assessment Upper Extremity Assessment Upper Extremity Assessment: Overall WFL for tasks assessed   Lower Extremity Assessment Lower Extremity Assessment:  RLE deficits/detail RLE Deficits / Details: Decreased strength and ROM as expected post-operatively.       Communication Communication Communication: No difficulties   Cognition Arousal/Alertness: Awake/alert Behavior During Therapy: WFL for tasks assessed/performed Overall Cognitive Status: Within Functional Limits for tasks assessed                                Home Living Family/patient expects to be discharged to:: Private residence Living Arrangements: Spouse/significant other;Children Available Help at Discharge: Family Type of Home: House Home Access: Stairs to enter Technical brewer of Steps: 10 Entrance Stairs-Rails: Right;Left (Too far apart, cannot reach both) Home Layout: Two level;Able to live on main level with bedroom/bathroom Alternate Level Stairs-Number of Steps: flight   Bathroom Shower/Tub: Tub/shower unit;Curtain Shower/tub characteristics: Architectural technologist: Handicapped height Bathroom Accessibility: Yes How Accessible: Accessible via walker Home Equipment: [place] - 2 wheels;Cane - single point;Crutches;Shower seat;Toilet riser;Wheelchair - manual;Hand held shower head          Prior Functioning/Environment Level of Independence: Needs assistance    ADL's / Homemaking Assistance Needed: Pt required min assist for donning/doffing socks and shoes.            OT Problem List: Decreased strength;Decreased range of motion;Decreased activity tolerance;Impaired balance (sitting and/or standing);Decreased knowledge of use of DME or AE;Decreased knowledge of precautions;Pain;Decreased safety awareness   OT Treatment/Interventions: Self-care/ADL training;Therapeutic exercise;Therapeutic activities;Patient/family education;Balance training    OT Goals(Current goals can be found in the care plan section) Acute Rehab OT Goals Patient Stated Goal: be more mobile OT Goal Formulation: With patient/family Time For Goal Achievement:  05/08/16 Potential to Achieve Goals: Good ADL Goals Pt Will Perform Lower Body Bathing: sit to/from stand;with supervision;with adaptive equipment Pt Will Perform Lower Body Dressing: with supervision;with adaptive equipment;sit to/from stand Pt Will Transfer to Toilet: with supervision;ambulating;bedside commode Pt Will Perform Toileting - Clothing Manipulation and hygiene: with supervision;sit to/from stand Pt Will Perform Tub/Shower Transfer: with min guard assist;shower seat;ambulating;rolling walker  OT Frequency: Min 2X/week    End of Session Equipment Utilized During Treatment: Gait belt;Rolling walker;Oxygen CPM Right Knee CPM Right Knee: Off Nurse Communication: Other (comment) (O2 desaturation and O2 reapplied)  Activity Tolerance: Patient tolerated treatment well Patient left: in chair;with call bell/phone within reach;with family/visitor present   Time: [id number] OT Time Calculation (min): 37 min Charges:  OT General Charges $OT Visit: 1 Procedure OT Evaluation $OT Eval Moderate Complexity: 1 Procedure OT Treatments $Self Care/Home Management : 8-22 mins  Norman Herrlich, OTR/L [id number] 04/24/2016, 12:33 PM

## 2016-04-24 NOTE — Progress Notes (Signed)
Orthopedic Tech Progress Note Patient Details:  Deborah [name] 04-22-1956 [id number]  Patient ID: Deborah [name], female   DOB: [birth year], 60 y.o.   MRN: [id number] Applied cpm 0-60  [name] 04/24/2016, 6:22 AM

## 2016-04-25 LAB — CBC
HEMATOCRIT: 31.7 % — AB (ref 36.0–46.0)
HEMOGLOBIN: 10.5 g/dL — AB (ref 12.0–15.0)
MCH: 39.2 pg — AB (ref 26.0–34.0)
MCHC: 33.1 g/dL (ref 30.0–36.0)
MCV: 118.3 fL — AB (ref 78.0–100.0)
Platelets: 201 10*3/uL (ref 150–400)
RBC: 2.68 MIL/uL — ABNORMAL LOW (ref 3.87–5.11)
RDW: 14.7 % (ref 11.5–15.5)
WBC: 7.8 10*3/uL (ref 4.0–10.5)

## 2016-04-25 NOTE — Progress Notes (Signed)
Physical Therapy Treatment Patient Details Name: Deborah [name] MRN: [id number] DOB: 04-24-1956 Today's Date: 04/25/2016    History of Present Illness Pt is a 60 y.o. female s/p right total knee arthroplasty. Pt has a past medical history significant for atrial fibrillation, hypertension, paroxysmal atrial fibrillation, obstructive sleep apnea and obesity.    PT Comments    Pt is progressing well with her mobility, continues to need min assist for gait due to balance and safe RW use.  We will begin stair training this PM with pt and husband and continue to review her HEP.  I believe she would benefit from one more day (tomorrow) of mobility and practice before d/c home.  PT will continue to follow acutely.   Follow Up Recommendations  Home health PT;Supervision for mobility/OOB     Equipment Recommendations  Rolling walker with 5" wheels;3in1 (PT)    Recommendations for Other Services   NA     Precautions / Restrictions Precautions Precautions: Knee Precaution Booklet Issued: Yes (comment) Precaution Comments: knee exercise handout given and reviewed Required Braces or Orthoses: Knee Immobilizer - Right Knee Immobilizer - Right: On when out of bed or walking;Other (comment) (d/c 24 hours after surgery) Restrictions RLE Weight Bearing: Weight bearing as tolerated    Mobility  Bed Mobility Overal bed mobility: Needs Assistance Bed Mobility: Supine to Sit;Sit to Supine     Supine to sit: Supervision Sit to supine: Supervision   General bed mobility comments: Supervision for safety pt using her hands to help progres leg EOB and back into bed.  Bed flat, elevated to simulate bed height at home, and no rails. Extra time needed to complete, but did so without physical assistance.   Transfers Overall transfer level: Needs assistance Equipment used: Rolling walker (2 wheeled) Transfers: Sit to/from Stand Sit to Stand: Supervision;From elevated surface         General  transfer comment: supervision from very high bed (simulating bed height from home). would likely need more assist from lower sitting surface.   Ambulation/Gait Ambulation/Gait assistance: Min assist Ambulation Distance (Feet): 130 Feet Assistive device: Rolling walker (2 wheeled) Gait Pattern/deviations: Step-to pattern;Antalgic Gait velocity: decreased Gait velocity interpretation: Below normal speed for age/gender General Gait Details: Moderately antalgic gait pattern with buckling (even with KI donned) over right leg during gait.  Verbal cues for safe proximity to RW (too close at the start and too far away at the end).  Min assist for balance and control of the RW during gait.   Stairs Stairs:  (plan to do this PM)                 Balance Overall balance assessment: Needs assistance Sitting-balance support: Feet supported;Bilateral upper extremity supported Sitting balance-Leahy Scale: Good     Standing balance support: Bilateral upper extremity supported Standing balance-Leahy Scale: Poor                      Cognition Arousal/Alertness: Awake/alert Behavior During Therapy: WFL for tasks assessed/performed Overall Cognitive Status: Within Functional Limits for tasks assessed                      Exercises Total Joint Exercises Ankle Circles/Pumps: AROM;Both;Supine;20 reps Quad Sets: AROM;Both;10 reps Towel Squeeze: AROM;Both;10 reps Heel Slides: AAROM;Right;10 reps;Other (comment) (with blanket for AA)        Pertinent Vitals/Pain Pain Assessment: 0-10 Pain Score: 6  Pain Location: right knee Pain Descriptors / Indicators: Aching;Burning;Grimacing;Guarding Pain  Intervention(s): Limited activity within patient's tolerance;Monitored during session;Repositioned           PT Goals (current goals can now be found in the care plan section) Acute Rehab PT Goals Patient Stated Goal: be more mobile Progress towards PT goals: Progressing toward  goals    Frequency    7X/week      PT Plan Current plan remains appropriate       End of Session Equipment Utilized During Treatment: Right knee immobilizer Activity Tolerance: Patient limited by pain;Patient limited by fatigue Patient left: in bed;with call bell/phone within reach;with family/visitor present     Time: 1200-1237 PT Time Calculation (min) (ACUTE ONLY): 37 min  Charges:  $Gait Training: 8-22 mins $Therapeutic Exercise: 8-22 mins                      Zenda Herskowitz B. Desirey Keahey, PT, DPT [phone]   04/25/2016, 1:40 PM

## 2016-04-25 NOTE — Progress Notes (Signed)
Orthopedic Tech Progress Note Patient Details:  Deborah [name] 02/12/1956 [id number]  Patient ID: Deborah [name], female   DOB: 22-Sep-1955, 60 y.o.   MRN: [id number]   Hildred Priest 04/25/2016, 2:23 PM Placed pt's rle on cpm@0 -70 degrees; RN notified

## 2016-04-25 NOTE — Care Management Note (Addendum)
Case Management Note  Patient Details  Name: Deborah [name] MRN: [id number] Date of Birth: [birth year]  Subjective/Objective:                  Right total knee replacement  Action/Plan: CM spoke to patient at the bedside along with her husband. She has a RW, cane, shower extender at home and agreeable to 3N1. Cm called Reg[facility] with [facility] to deliver DME to bedside. CM offered patient choice for HHPT and she chose [facility] (formerly [place]). Cm called [facility] to advise of patient being discharged home with [facility] PT. Patient's husband and son will be able to provide 24/7 support. No further CM needs identified.   Expected Discharge Date:  04/25/16             Expected Discharge Plan:  [place]  In-House Referral:     Discharge planning Services  CM Consult  Post Acute Care Choice:  Durable Medical Equipment, Home Health Choice offered to:  Patient, Spouse  DME Arranged:  3-N-1 DME Agency:  [place]:  PT [place]:  [facility] ([facility])  Status of Service:  Completed, signed off  If discussed at [employer] of Stay Meetings, dates discussed:    Additional Comments:  Guido Sander, RN 04/25/2016, 12:05 PM

## 2016-04-25 NOTE — Discharge Summary (Addendum)
Physician Discharge Summary  Patient ID: Deborah [name] MRN: [id number] DOB/AGE: Apr 30, 1956 60 y.o.  Admit date: 04/23/2016 Discharge date: 04/25/2016  Admission Diagnoses:  Primary localized osteoarthritis of the right knee  Discharge Diagnoses:  Active Problems:   Primary localized osteoarthritis of right knee   Past Medical History:  Diagnosis Date  . A-fib ([place])   . Anemia    hx   . Anxiety   . Arthritis   . Dysrhythmia    atrial fib/flutter  . GERD (gastroesophageal reflux disease)    occ  . HTN (hypertension)   . Obesity   . OSA (obstructive sleep apnea)    cpap  . PAF (paroxysmal atrial fibrillation) ([place])    s/p DCCV 2010  . Peripheral vascular disease ([place]) 2016   after flying in leg    Surgeries: Procedure(s): TOTAL KNEE ARTHROPLASTY on 04/23/2016   Consultants (if any):   Discharged Condition: Improved  Hospital Course: Deborah [name] is an 60 y.o. female who was admitted 04/23/2016 with a diagnosis of <principal problem not specified> and went to the operating room on 04/23/2016 and underwent the above named procedures.    She was given perioperative antibiotics:  Anti-infectives    Start     Dose/Rate Route Frequency Ordered Stop   04/23/16 1700  ceFAZolin (ANCEF) IVPB 2g/100 mL premix     2 g 200 mL/hr over 30 Minutes Intravenous Every 6 hours 04/23/16 1518 04/24/16 0023   04/23/16 0600  ceFAZolin (ANCEF) 3 g in dextrose 5 % 50 mL IVPB     3 g 130 mL/hr over 30 Minutes Intravenous On call to O.R. 04/22/16 1230 04/23/16 1130    .  She was given sequential compression devices, early ambulation, and eliquis for DVT prophylaxis.  She benefited maximally from the hospital stay and there were no complications.    Recent vital signs:  Vitals:   04/25/16 0002 04/25/16 0643  BP: (!) 119/57 (!) 108/55  Pulse: 78 75  Resp:    Temp: 98.9 F (37.2 C) 98 F (36.7 C)    Recent laboratory studies:  Lab Results  Component Value Date   HGB 10.5 (L)  04/25/2016   HGB 12.1 04/24/2016   HGB 13.5 04/12/2016   Lab Results  Component Value Date   WBC 7.8 04/25/2016   PLT 201 04/25/2016   Lab Results  Component Value Date   INR 1.23 04/12/2016   Lab Results  Component Value Date   NA 139 04/24/2016   K 4.1 04/24/2016   CL 103 04/24/2016   CO2 28 04/24/2016   BUN 9 04/24/2016   CREATININE 1.01 (H) 04/24/2016   GLUCOSE 115 (H) 04/24/2016    Discharge Medications:     Medication List    STOP taking these medications   acetaminophen 500 MG tablet Commonly known as:  TYLENOL     TAKE these medications   ALPRAZolam 0.5 MG tablet Commonly known as:  XANAX Take 0.5 mg by mouth daily as needed for anxiety. Reported on 10/08/2015   apixaban 5 MG Tabs tablet Commonly known as:  ELIQUIS Take 1 tablet (5 mg total) by mouth 2 (two) times daily.   BIOFREEZE EX Apply 1 application topically daily as needed (knee pain).   calcium carbonate 500 MG chewable tablet Commonly known as:  TUMS - dosed in mg elemental calcium Chew 1 tablet by mouth as directed.   escitalopram 10 MG tablet Commonly known as:  LEXAPRO Take 10 mg by mouth every  evening.   metoprolol succinate 100 MG 24 hr tablet Commonly known as:  TOPROL-XL Take 50 mg by mouth daily. Take with or immediately following a meal.   oxyCODONE-acetaminophen 5-325 MG tablet Commonly known as:  ROXICET Take 1-2 tablets by mouth every 4 (four) hours as needed for moderate pain or severe pain.   Polyethyl Glycol-Propyl Glycol 0.4-0.3 % Soln Apply 2 drops to eye daily as needed (eye dryness).       Diagnostic Studies: Dg Chest 2 View  Result Date: 04/12/2016 CLINICAL DATA:  Preop evaluation for upcoming knee surgery, initial encounter EXAM: CHEST  2 VIEW COMPARISON:  11/27/2015 FINDINGS: The heart size and mediastinal contours are within normal limits. Both lungs are clear. The visualized skeletal structures are unremarkable. IMPRESSION: No active cardiopulmonary  disease. Electronically Signed   By: Inez Catalina M.D.   On: 04/12/2016 14:19    Disposition: 01-Home or Self Care    Follow-up Information    CAFFREY JR,W D, MD. Schedule an appointment as soon as possible for a visit in 2 weeks.   Specialty:  Orthopedic Surgery Contact information: [place] [address] [phone]            Signed: Johnny Bridge 04/25/2016, 11:59 AM

## 2016-04-25 NOTE — Progress Notes (Signed)
Physical Therapy Treatment Patient Details Name: Deborah [name] MRN: [id number] DOB: June 12, 1956 Today's Date: 04/25/2016    History of Present Illness Pt is a 60 y.o. female s/p right total knee arthroplasty. Pt has a past medical history significant for atrial fibrillation, hypertension, paroxysmal atrial fibrillation, obstructive sleep apnea and obesity.    PT Comments    Pt is progressing with mobility.  We attempted gait without KI and her knee is still to unstable to go without.  She was able, with quite a bit of difficulty, to get up the steps simulating home entry.  Husband states that he and his son will be helping her into the house.  I would still prefer that she stay for another round of therapy, but she is wanting to go home and has discharge papers already.    Follow Up Recommendations  Home health PT;Supervision for mobility/OOB     Equipment Recommendations  Rolling walker with 5" wheels;3in1 (PT)    Recommendations for Other Services   NA     Precautions / Restrictions Precautions Precautions: Knee Precaution Booklet Issued: Yes (comment) Precaution Comments: knee exercise handout given and reviewed Required Braces or Orthoses: Knee Immobilizer - Right Knee Immobilizer - Right: On when out of bed or walking;Other (comment) (tried without, knee is too unstable) Restrictions RLE Weight Bearing: Weight bearing as tolerated    Mobility  Bed Mobility Overal bed mobility: Needs Assistance Bed Mobility: Supine to Sit     Supine to sit: Min assist Sit to supine: Supervision   General bed mobility comments: Min assist to help progress right leg over EOB.    Transfers Overall transfer level: Needs assistance Equipment used: Rolling walker (2 wheeled) Transfers: Sit to/from Stand Sit to Stand: Min guard;From elevated surface;Min assist         General transfer comment: Min guard assist from elevated bed, min assist from lower  recliner  Ambulation/Gait Ambulation/Gait assistance: Min assist Ambulation Distance (Feet): 10 Feet Assistive device: Rolling walker (2 wheeled) Gait Pattern/deviations: Step-to pattern;Antalgic Gait velocity: decreased Gait velocity interpretation: Below normal speed for age/gender General Gait Details: Pt with moderately antalgic gait pattern. Attempted gait without KI (as there is an order to d/c 24 hours post op), however, pt's knee stability was not good enough to go without the KI for gait or stairs   Stairs Stairs: Yes Stairs assistance: +2 safety/equipment (mod assist) Stair Management: Two rails;Step to pattern;Forwards Number of Stairs: 4 (2x2) General stair comments: Pt attempted to go sideways with one rail, but physically could not get enough leverage and put enough weight on her right leg to do it this way, so she turned and put both arms on the rails (she will only be able to reach one at home, but has the help of two strong men to get into the house (her husband and son).  Educated on correct LE sequencing for ascending and descending the steps.           Balance Overall balance assessment: Needs assistance Sitting-balance support: Feet supported;No upper extremity supported Sitting balance-Leahy Scale: Good     Standing balance support: Bilateral upper extremity supported Standing balance-Leahy Scale: Poor                      Cognition Arousal/Alertness: Awake/alert Behavior During Therapy: WFL for tasks assessed/performed Overall Cognitive Status: Within Functional Limits for tasks assessed  Exercises Total Joint Exercises Ankle Circles/Pumps: AROM;Both;Supine;20 reps Quad Sets: AROM;Both;10 reps Towel Squeeze: AROM;Both;10 reps Heel Slides: AAROM;Right;10 reps;Other (comment) (with blanket for AA) Long Arc Quad: AROM;Right;10 reps Knee Flexion: AROM;AAROM;Right;10 reps;5 reps;Seated Goniometric ROM: 85         Pertinent Vitals/Pain Pain Assessment: 0-10 Pain Score: 7  Pain Location: right knee Pain Descriptors / Indicators: Aching;Burning;Grimacing;Guarding Pain Intervention(s): Limited activity within patient's tolerance;Monitored during session;Repositioned           PT Goals (current goals can now be found in the care plan section) Acute Rehab PT Goals Patient Stated Goal: be more mobile Progress towards PT goals: Progressing toward goals    Frequency    7X/week      PT Plan Current plan remains appropriate       End of Session Equipment Utilized During Treatment: Right knee immobilizer Activity Tolerance: Patient limited by pain;Patient limited by fatigue Patient left: in chair;with call bell/phone within reach;with family/visitor present (in [facility] with RN tech for d/c)     Time: [id number] PT Time Calculation (min) (ACUTE ONLY): 50 min  Charges:  $Gait Training: 8-22 mins $Therapeutic Exercise: 8-22 mins $Therapeutic Activity: 8-22 mins                     Issiah Huffaker B. Adrine Hayworth, PT, DPT [phone]   04/25/2016, 4:50 PM

## 2016-04-25 NOTE — Progress Notes (Signed)
Patient ID: Deborah [name], female   DOB: 1955-10-25, 60 y.o.   MRN: [id number]     Subjective:  Patient reports pain as mild.  Patient reports improvement.  Denies any CP or SOB  Objective:   VITALS:   Vitals:   04/24/16 1419 04/24/16 2035 04/25/16 0002 04/25/16 0643  BP: 124/61 (!) 117/58 (!) 119/57 (!) 108/55  Pulse: 89 77 78 75  Resp: 20     Temp: 98.9 F (37.2 C) 98.8 F (37.1 C) 98.9 F (37.2 C) 98 F (36.7 C)  TempSrc: Oral Oral Oral Oral  SpO2: 98% 97% 97% 92%  Weight:      Height:        ABD soft Sensation intact distally Dorsiflexion/Plantar flexion intact Incision: dressing C/D/I and no drainage Dressing changed wound good  Lab Results  Component Value Date   WBC 7.8 04/25/2016   HGB 10.5 (L) 04/25/2016   HCT 31.7 (L) 04/25/2016   MCV 118.3 (H) 04/25/2016   PLT 201 04/25/2016   BMET    Component Value Date/Time   NA 139 04/24/2016 0334   K 4.1 04/24/2016 0334   CL 103 04/24/2016 0334   CO2 28 04/24/2016 0334   GLUCOSE 115 (H) 04/24/2016 0334   BUN 9 04/24/2016 0334   CREATININE 1.01 (H) 04/24/2016 0334   CREATININE 0.92 11/25/2015 1344   CALCIUM 8.6 (L) 04/24/2016 0334   GFRNONAA 59 (L) 04/24/2016 0334   GFRAA >60 04/24/2016 0334     Assessment/Plan: 2 Days Post-Op   Active Problems:   Primary localized osteoarthritis of right knee   Advance diet Up with therapy Discharge home with home health if passes PT in the afternoon WBAT Dry dressing PRN    DOUGLAS PARRY, BRANDON 04/25/2016, 10:51 AM  .Discussed and agree with above.  Marchia Bond, MD Cell [phone]

## 2016-04-25 NOTE — Progress Notes (Addendum)
Occupational Therapy Treatment Patient Details Name: [name] MRN: [id number] DOB: August 21, 1955 Today's Date: 04/25/2016    History of present illness Pt is a 60 y.o. female s/p right total knee arthroplasty. Pt has a past medical history significant for atrial fibrillation, hypertension, paroxysmal atrial fibrillation, obstructive sleep apnea and obesity.   OT comments  Pt progressing with OT and motivated to return home despite pain and increased assistance required. Pt currently requires min assist for functional mobility and mod assist for LB ADL. Pt plans to D/C home today with son and husband who can provide 24 hour assistance. Discussed need to have assistance during functional mobility at home to maintain safety and pt demonstrates understanding. Pt feeling unsafe to attempt tub transfer at this time. Educated pt on knee precautions during ADL, no pillow under knee, dressing techniques, use of ice for pain management, energy conservation, and fall prevention. Pt demonstrates and verbalizes understanding. Feel pt has good family support at home who can provide necessary assistance. D/C plan remains appropriate. OT will continue to follow while admitted.   Follow Up Recommendations  No OT follow up;Supervision/Assistance - 24 hour    Equipment Recommendations  None recommended by OT       Precautions / Restrictions Precautions Precautions: Knee Precaution Booklet Issued: No Precaution Comments: Reviewed knee precautions during ADL and no pillow under knee. Required Braces or Orthoses: Knee Immobilizer - Right Knee Immobilizer - Right: On when out of bed or walking;Other (comment) (Discontinue x24 hours from surgery.) Restrictions Weight Bearing Restrictions: Yes RLE Weight Bearing: Weight bearing as tolerated       Mobility Bed Mobility Overal bed mobility: Needs Assistance Bed Mobility: Supine to Sit     Supine to sit: Min assist     General bed mobility comments:  Min assist to move R LE and use of bed rails.  Transfers Overall transfer level: Needs assistance Equipment used: Rolling walker (2 wheeled) Transfers: Sit to/from Stand Sit to Stand: Min assist;Min guard;From elevated surface         General transfer comment: Min assist first trial from elevated surface (to height of her bed at home) min guard assist second trial.    Balance Overall balance assessment: Needs assistance Sitting-balance support: Feet supported;No upper extremity supported Sitting balance-Leahy Scale: Fair     Standing balance support: Single extremity supported;During functional activity Standing balance-Leahy Scale: Poor Standing balance comment: Able to remove single UE from RW for LB dressing and clothing management tasks with min assist for support.                   ADL Overall ADL's : Needs assistance/impaired                 Upper Body Dressing : Set up;Sitting   Lower Body Dressing: Moderate assistance Lower Body Dressing Details (indicate cue type and reason): Required assist to thread feet into pants and to pull over hips while standing. Toilet Transfer: Minimal assistance;Ambulation;RW;BSC Toilet Transfer Details (indicate cue type and reason): Required increased time and cues for safe use of RW. Toileting- Clothing Manipulation and Hygiene: Minimal assistance;Sit to/from stand       Functional mobility during ADLs: Minimal assistance;Rolling walker General ADL Comments: Pt requiring increased time with all ADL. Educated pt on knee precautions during ADL, dressing techniques, use of ice for pain management, energy conservation, and fall prevention at home.                Cognition  Behavior During Therapy: WFL for tasks assessed/performed Overall Cognitive Status: Within Functional Limits for tasks assessed                                    Pertinent Vitals/ Pain       Pain Assessment: 0-10 Pain Score: 7   Pain Location: R knee Pain Descriptors / Indicators: Operative site guarding;Sore Pain Intervention(s): Limited activity within patient's tolerance;Monitored during session;Ice applied;Repositioned         Frequency  Min 2X/week        Progress Toward Goals  OT Goals(current goals can now be found in the care plan section)  Progress towards OT goals: Progressing toward goals  Acute Rehab OT Goals Patient Stated Goal: be more mobile OT Goal Formulation: With patient/family Time For Goal Achievement: 05/08/16 Potential to Achieve Goals: Good ADL Goals Pt Will Perform Lower Body Bathing: sit to/from stand;with supervision;with adaptive equipment Pt Will Perform Lower Body Dressing: with supervision;with adaptive equipment;sit to/from stand Pt Will Transfer to Toilet: with supervision;ambulating;bedside commode Pt Will Perform Toileting - Clothing Manipulation and hygiene: with supervision;sit to/from stand Pt Will Perform Tub/Shower Transfer: with min guard assist;shower seat;ambulating;rolling walker  Plan Discharge plan remains appropriate       End of Session Equipment Utilized During Treatment: Gait belt;Rolling walker;Right knee immobilizer   Activity Tolerance Patient tolerated treatment well;Other (comment) (Pt with decreased activity tolerance, fatigued after session)   Patient Left in chair;with call bell/phone within reach   Nurse Communication Other (comment) (D/C planning)        Time: [id number] OT Time Calculation (min): 42 min  Charges: OT General Charges $OT Visit: 1 Procedure OT Treatments $Self Care/Home Management : 38-52 mins  Norman Herrlich, OTR/L [phone] 04/25/2016, 8:34 AM

## 2016-04-26 ENCOUNTER — Encounter (HOSPITAL_COMMUNITY): Payer: Self-pay | Admitting: Orthopedic Surgery

## 2016-04-28 NOTE — Anesthesia Postprocedure Evaluation (Signed)
Anesthesia Post Note  Patient: Deborah [name]  Procedure(s) Performed: Procedure(s) (LRB): TOTAL KNEE ARTHROPLASTY (Right)  Patient location during evaluation: PACU Anesthesia Type: Spinal Level of consciousness: oriented and awake and alert Pain management: pain level controlled Vital Signs Assessment: post-procedure vital signs reviewed and stable Respiratory status: spontaneous breathing, respiratory function stable and patient connected to nasal cannula oxygen Cardiovascular status: blood pressure returned to baseline and stable Postop Assessment: no headache and no backache Anesthetic complications: no    Last Vitals:  Vitals:   04/25/16 0643 04/25/16 1410  BP: (!) 108/55 (!) 116/56  Pulse: 75 77  Resp:  18  Temp: 36.7 C 36.9 C    Last Pain:  Vitals:   04/25/16 1410  TempSrc: Oral  PainSc:                  [place]

## 2016-05-06 ENCOUNTER — Other Ambulatory Visit: Payer: Self-pay | Admitting: Interventional Cardiology

## 2016-06-30 ENCOUNTER — Other Ambulatory Visit: Payer: Self-pay | Admitting: Family Medicine

## 2016-06-30 DIAGNOSIS — M6281 Muscle weakness (generalized): Secondary | ICD-10-CM | POA: Diagnosis not present

## 2016-06-30 DIAGNOSIS — M25561 Pain in right knee: Secondary | ICD-10-CM | POA: Diagnosis not present

## 2016-06-30 DIAGNOSIS — Z1231 Encounter for screening mammogram for malignant neoplasm of breast: Secondary | ICD-10-CM

## 2016-06-30 DIAGNOSIS — M25661 Stiffness of right knee, not elsewhere classified: Secondary | ICD-10-CM | POA: Diagnosis not present

## 2016-07-07 DIAGNOSIS — M6281 Muscle weakness (generalized): Secondary | ICD-10-CM | POA: Diagnosis not present

## 2016-07-07 DIAGNOSIS — M25561 Pain in right knee: Secondary | ICD-10-CM | POA: Diagnosis not present

## 2016-07-07 DIAGNOSIS — M25661 Stiffness of right knee, not elsewhere classified: Secondary | ICD-10-CM | POA: Diagnosis not present

## 2016-07-12 DIAGNOSIS — M25561 Pain in right knee: Secondary | ICD-10-CM | POA: Diagnosis not present

## 2016-07-13 DIAGNOSIS — M25661 Stiffness of right knee, not elsewhere classified: Secondary | ICD-10-CM | POA: Diagnosis not present

## 2016-07-13 DIAGNOSIS — M6281 Muscle weakness (generalized): Secondary | ICD-10-CM | POA: Diagnosis not present

## 2016-07-13 DIAGNOSIS — M25561 Pain in right knee: Secondary | ICD-10-CM | POA: Diagnosis not present

## 2016-07-20 DIAGNOSIS — M25561 Pain in right knee: Secondary | ICD-10-CM | POA: Diagnosis not present

## 2016-07-20 DIAGNOSIS — M6281 Muscle weakness (generalized): Secondary | ICD-10-CM | POA: Diagnosis not present

## 2016-07-20 DIAGNOSIS — M25661 Stiffness of right knee, not elsewhere classified: Secondary | ICD-10-CM | POA: Diagnosis not present

## 2016-07-22 ENCOUNTER — Ambulatory Visit
Admission: RE | Admit: 2016-07-22 | Discharge: 2016-07-22 | Disposition: A | Discharge status: Home / Self Care | Payer: 59 | Source: Ambulatory Visit | Attending: Family Medicine | Admitting: Family Medicine

## 2016-07-22 DIAGNOSIS — Z1231 Encounter for screening mammogram for malignant neoplasm of breast: Secondary | ICD-10-CM | POA: Diagnosis not present

## 2016-07-22 DIAGNOSIS — M25561 Pain in right knee: Secondary | ICD-10-CM | POA: Diagnosis not present

## 2016-07-22 DIAGNOSIS — M6281 Muscle weakness (generalized): Secondary | ICD-10-CM | POA: Diagnosis not present

## 2016-07-22 DIAGNOSIS — M25661 Stiffness of right knee, not elsewhere classified: Secondary | ICD-10-CM | POA: Diagnosis not present

## 2016-07-26 DIAGNOSIS — Z01419 Encounter for gynecological examination (general) (routine) without abnormal findings: Secondary | ICD-10-CM | POA: Diagnosis not present

## 2016-07-28 ENCOUNTER — Telehealth: Payer: Self-pay | Admitting: Cardiology

## 2016-07-28 DIAGNOSIS — M25661 Stiffness of right knee, not elsewhere classified: Secondary | ICD-10-CM | POA: Diagnosis not present

## 2016-07-28 DIAGNOSIS — M6281 Muscle weakness (generalized): Secondary | ICD-10-CM | POA: Diagnosis not present

## 2016-07-28 DIAGNOSIS — M25561 Pain in right knee: Secondary | ICD-10-CM | POA: Diagnosis not present

## 2016-07-28 NOTE — Telephone Encounter (Signed)
Informed pt I would forward to [facility] for advisement on stopping Xarelto.  She regularly palpates pulse and reports it regular.  She understands it may be tomorrow before hearing back from me.

## 2016-07-28 NOTE — Telephone Encounter (Signed)
New message     Pt c/o medication issue:  1. Name of Medication: eliquis 2. How are you currently taking this medication (dosage and times per day)? 5 mg 3. Are you having a reaction (difficulty breathing--STAT)?  no 4. What is your medication issue? Pt want to know if she should still be taking this medication.  Please call

## 2016-07-29 MED ORDER — APIXABAN 5 MG PO TABS: 5.0000 mg | ORAL_TABLET | Freq: Two times a day (BID) | ORAL | 2 refills | Status: DC

## 2016-07-29 NOTE — Telephone Encounter (Signed)
Advised to continue Eliquis, per Dr. Curt Bears. Patient verbalized understanding and agreeable to plan.

## 2016-07-29 NOTE — Telephone Encounter (Signed)
Follow up ° ° °Pt verbalized that she is returning call for rn °

## 2016-08-05 DIAGNOSIS — M25561 Pain in right knee: Secondary | ICD-10-CM | POA: Diagnosis not present

## 2016-08-05 DIAGNOSIS — M6281 Muscle weakness (generalized): Secondary | ICD-10-CM | POA: Diagnosis not present

## 2016-08-05 DIAGNOSIS — M25661 Stiffness of right knee, not elsewhere classified: Secondary | ICD-10-CM | POA: Diagnosis not present

## 2016-08-11 DIAGNOSIS — D2262 Melanocytic nevi of left upper limb, including shoulder: Secondary | ICD-10-CM | POA: Diagnosis not present

## 2016-08-11 DIAGNOSIS — D2362 Other benign neoplasm of skin of left upper limb, including shoulder: Secondary | ICD-10-CM | POA: Diagnosis not present

## 2016-08-11 DIAGNOSIS — L821 Other seborrheic keratosis: Secondary | ICD-10-CM | POA: Diagnosis not present

## 2016-08-24 DIAGNOSIS — M25561 Pain in right knee: Secondary | ICD-10-CM | POA: Diagnosis not present

## 2016-09-01 DIAGNOSIS — G4733 Obstructive sleep apnea (adult) (pediatric): Secondary | ICD-10-CM | POA: Diagnosis not present

## 2016-09-14 ENCOUNTER — Encounter: Payer: Self-pay | Admitting: Cardiology

## 2016-09-23 ENCOUNTER — Encounter: Payer: Self-pay | Admitting: *Deleted

## 2016-09-23 ENCOUNTER — Other Ambulatory Visit: Payer: Self-pay | Admitting: Cardiology

## 2016-09-23 ENCOUNTER — Ambulatory Visit (INDEPENDENT_AMBULATORY_CARE_PROVIDER_SITE_OTHER): Payer: 59 | Admitting: Cardiology

## 2016-09-23 ENCOUNTER — Encounter (INDEPENDENT_AMBULATORY_CARE_PROVIDER_SITE_OTHER): Payer: Self-pay

## 2016-09-23 ENCOUNTER — Encounter: Payer: Self-pay | Admitting: Cardiology

## 2016-09-23 VITALS — BP 116/62 | HR 105 | Ht 74.0 in | Wt 272.0 lb

## 2016-09-23 DIAGNOSIS — Z01812 Encounter for preprocedural laboratory examination: Secondary | ICD-10-CM

## 2016-09-23 DIAGNOSIS — I4819 Other persistent atrial fibrillation: Secondary | ICD-10-CM

## 2016-09-23 DIAGNOSIS — I481 Persistent atrial fibrillation: Secondary | ICD-10-CM

## 2016-09-23 MED ORDER — FLECAINIDE ACETATE 100 MG PO TABS: 100.0000 mg | ORAL_TABLET | Freq: Two times a day (BID) | ORAL | 3 refills | Status: DC

## 2016-09-23 NOTE — Progress Notes (Signed)
Electrophysiology Office Note   Date:  09/23/2016   ID:  Deborah [name], DOB 06-22-1956, MRN [id number]  PCP:  Simona Huh, MD  Cardiologist:  Irish Lack Primary Electrophysiologist:  Horice Carrero Meredith Leeds, MD    Chief Complaint  Patient presents with  . Follow-up    Persistent Afib     History of Present Illness: Deborah [name] is a 61 y.o. female who presents today for electrophysiology evaluation.   She has a history of paroxysmal atrial fibrillation.  Ablation 12/04/15. She is unfortunately back in atrial fibrillation today. Her main symptoms are of fatigue. She does not notice episodes of palpitations. She was previously on flecainide 100 mg twice a day but was taken off after her ablation. She feels like she went back into atrial fibrillation approximately 2-1/2 weeks ago.  Today, she denies symptoms of chest pain, orthopnea, PND, lower extremity edema, claudication, dizziness, presyncope, syncope, bleeding, or neurologic sequela. The patient is tolerating medications without difficulties and is otherwise without complaint today.    Past Medical History:  Diagnosis Date  . A-fib ([place])   . Anemia    hx   . Anxiety   . Arthritis   . Dysrhythmia    atrial fib/flutter  . GERD (gastroesophageal reflux disease)    occ  . HTN (hypertension)   . Obesity   . OSA (obstructive sleep apnea)    cpap  . PAF (paroxysmal atrial fibrillation) ([place])    s/p DCCV 2010  . Peripheral vascular disease ([place]) 2016   after flying in leg   Past Surgical History:  Procedure Laterality Date  . BTL    . CARDIAC CATHETERIZATION Left 2016   cataract  . ELECTROPHYSIOLOGIC STUDY N/A 12/04/2015   Procedure: Atrial Fibrillation Ablation;  Surgeon: Ancelmo Hunt Meredith Leeds, MD;  Location: [facility];  Service: Cardiovascular;  Laterality: N/A;  . TONSILLECTOMY AND ADENOIDECTOMY    . TOTAL KNEE ARTHROPLASTY Right 04/23/2016  . TOTAL KNEE ARTHROPLASTY Right 04/23/2016   Procedure: TOTAL KNEE  ARTHROPLASTY;  Surgeon: [name], MD;  Location: [facility];  Service: Orthopedics;  Laterality: Right;  . TUBAL LIGATION       Current Outpatient Prescriptions  Medication Sig Dispense Refill  . ALPRAZolam (XANAX) 0.5 MG tablet Take 0.5 mg by mouth daily as needed for anxiety. Reported on 10/08/2015    . apixaban (ELIQUIS) 5 MG TABS tablet Take 1 tablet (5 mg total) by mouth 2 (two) times daily. 60 tablet 2  . calcium carbonate (TUMS - DOSED IN MG ELEMENTAL CALCIUM) 500 MG chewable tablet Chew 1 tablet by mouth as directed.    . escitalopram (LEXAPRO) 10 MG tablet Take 10 mg by mouth every evening.   3  . metoprolol succinate (TOPROL-XL) 100 MG 24 hr tablet Take 50 mg by mouth daily. Take with or immediately following a meal.    . oxyCODONE-acetaminophen (ROXICET) 5-325 MG tablet Take 1-2 tablets by mouth every 4 (four) hours as needed for moderate pain or severe pain. 84 tablet 0  . flecainide (TAMBOCOR) 100 MG tablet Take 1 tablet (100 mg total) by mouth 2 (two) times daily. 60 tablet 3   No current facility-administered medications for this visit.     Allergies:   Benazepril; Adhesive [tape]; and Sulfa antibiotics   Social History:  The patient  reports that she has never smoked. She has never used smokeless tobacco. She reports that she drinks alcohol. She reports that she does not use drugs.   Family  History:  The patient's family history includes Heart disease in her mother; Stomach cancer in her paternal grandfather.    ROS:  Please see the history of present illness.   Otherwise, review of systems is positive for fatigue.   All other systems are reviewed and negative.    PHYSICAL EXAM: VS:  BP 116/62   Pulse (!) 105   Ht 6\' 2"  (1.88 m)   Wt 272 lb (123.4 kg)   BMI 34.92 kg/m  , BMI Body mass index is 34.92 kg/m. GEN: Well nourished, well developed, in no acute distress  HEENT: normal  Neck: no JVD, carotid bruits, or masses Cardiac: iRRR; no murmurs, rubs, or  gallops,no edema  Respiratory:  clear to auscultation bilaterally, normal work of breathing GI: soft, nontender, nondistended, + BS MS: no deformity or atrophy  Skin: warm and dry Neuro:  Strength and sensation are intact Psych: euthymic mood, full affect  EKG:  EKG is ordered today.Personal review of the ECG ordered shows Atrial fibrillation, rate 115, right bundle branch block   Recent Labs: 04/12/2016: ALT 17 04/24/2016: BUN 9; Creatinine, Ser 1.01; Potassium 4.1; Sodium 139 04/25/2016: Hemoglobin 10.5; Platelets 201    Lipid Panel  No results found for: CHOL, TRIG, HDL, CHOLHDL, VLDL, LDLCALC, LDLDIRECT   Wt Readings from Last 3 Encounters:  09/23/16 272 lb (123.4 kg)  04/23/16 280 lb 14.4 oz (127.4 kg)  04/12/16 280 lb 14.4 oz (127.4 kg)      Other studies Reviewed: Additional studies/ records that were reviewed today include: TTE 11/25/15 - Left ventricle: Systolic function was normal. The estimated   ejection fraction was in the range of 50% to 55%. The study is   not technically sufficient to allow evaluation of LV diastolic   function. - Left atrium: The appendage was moderately dilated. - Atrial septum: No defect or patent foramen ovale was identified.   ASSESSMENT AND PLAN:  1.  Persistent atrial fibrillation: Patient presents today in atrial fibrillation. She also has a history of atrial flutter.  AF ablation 12/04/15. Fortunately she is back in atrial fibrillation today. We'll therefore start her on flecainide 100 mg twice a day. We'll plan for cardioversion next week. She does have a right bundle-branch block that has been stable on a full dose of flecainide. She Ayde Record likely tolerate this medication again.  This patients CHA2DS2-VASc Score and unadjusted Ischemic Stroke Rate (% per year) is equal to 2.2 % stroke rate/year from a score of 2  Above score calculated as 1 point each if present [CHF, HTN, DM, Vascular=MI/PAD/Aortic Plaque, Age if 65-74, or  Female] Above score calculated as 2 points each if present [Age > 75, or Stroke/TIA/TE]   2. Hypertension: well controlled   Current medicines are reviewed at length with the patient today.   The patient does not have concerns regarding her medicines.  The following changes were made today:  flecainide  Labs/ tests ordered today include: cardioversion Orders Placed This Encounter  Procedures  . EKG 12-Lead     Disposition:   FU with Marcanthony Sleight 3 months  Signed, Delight Bickle Meredith Leeds, MD  09/23/2016 4:13 PM     [address] [place] [address] [phone] (office) [phone] (fax)

## 2016-09-23 NOTE — Patient Instructions (Addendum)
Medication Instructions:    Your physician has recommended you make the following change in your medication:  1) RESTART Flecainide 100 mg twice daily  --- If you need a refill on your cardiac medications before your next appointment, please call your pharmacy. ---  Labwork:  None ordered  Testing/Procedures: Your physician has recommended that you have a Cardioversion (DCCV). Electrical Cardioversion uses a jolt of electricity to your heart either through paddles or wired patches attached to your chest. This is a controlled, usually prescheduled, procedure. Defibrillation is done under light anesthesia in the hospital, and you usually go home the day of the procedure. This is done to get your heart back into a normal rhythm. You are not awake for the procedure. Please see the instruction sheet given to you today.  Follow-Up:  Your physician recommends that you schedule a follow-up appointment in:3 months with Dr. Curt Bears.  Thank you for choosing [facility]!!   Deborah Curet, RN [phone]

## 2016-09-23 NOTE — Addendum Note (Signed)
Addended by: [name] on: 09/23/2016 04:21 PM   Modules accepted: Orders

## 2016-09-24 LAB — BASIC METABOLIC PANEL
BUN/Creatinine Ratio: 18 (ref 12–28)
BUN: 17 mg/dL (ref 8–27)
CALCIUM: 9.3 mg/dL (ref 8.7–10.3)
CHLORIDE: 104 mmol/L (ref 96–106)
CO2: 26 mmol/L (ref 18–29)
Creatinine, Ser: 0.94 mg/dL (ref 0.57–1.00)
GFR calc non Af Amer: 65 mL/min/{1.73_m2} (ref >59)
GFR, EST AFRICAN AMERICAN: 75 mL/min/{1.73_m2} (ref >59)
GLUCOSE: 101 mg/dL — AB (ref 65–99)
POTASSIUM: 4.5 mmol/L (ref 3.5–5.2)
Sodium: 145 mmol/L — ABNORMAL HIGH (ref 134–144)

## 2016-09-24 LAB — CBC WITH DIFFERENTIAL/PLATELET
BASOS ABS: 0 10*3/uL (ref 0.0–0.2)
Basos: 0 %
EOS (ABSOLUTE): 0.1 10*3/uL (ref 0.0–0.4)
Eos: 2 %
Hematocrit: 34 % (ref 34.0–46.6)
Hemoglobin: 11.4 g/dL (ref 11.1–15.9)
IMMATURE GRANS (ABS): 0 10*3/uL (ref 0.0–0.1)
Immature Granulocytes: 0 %
LYMPHS: 35 %
Lymphocytes Absolute: 2.6 10*3/uL (ref 0.7–3.1)
MCH: 38.1 pg — ABNORMAL HIGH (ref 26.6–33.0)
MCHC: 33.5 g/dL (ref 31.5–35.7)
MCV: 114 fL — ABNORMAL HIGH (ref 79–97)
Monocytes Absolute: 0.6 10*3/uL (ref 0.1–0.9)
Monocytes: 7 %
NEUTROS ABS: 4.2 10*3/uL (ref 1.4–7.0)
NEUTROS PCT: 56 %
PLATELETS: 266 10*3/uL (ref 150–379)
RBC: 2.99 x10E6/uL — ABNORMAL LOW (ref 3.77–5.28)
RDW: 17.8 % — ABNORMAL HIGH (ref 12.3–15.4)
WBC: 7.5 10*3/uL (ref 3.4–10.8)

## 2016-09-24 LAB — SPECIMEN STATUS

## 2016-09-27 ENCOUNTER — Encounter: Payer: Self-pay | Admitting: Cardiology

## 2016-09-27 NOTE — Telephone Encounter (Signed)
Deborah [name] is returning your call from Friday 09/24/16 . Thanks

## 2016-09-27 NOTE — Telephone Encounter (Signed)
This encounter was created in error - please disregard.

## 2016-09-30 ENCOUNTER — Encounter (HOSPITAL_COMMUNITY): Payer: Self-pay | Admitting: Certified Registered Nurse Anesthetist

## 2016-09-30 LAB — SPECIMEN STATUS REPORT

## 2016-09-30 LAB — PROTIME-INR
INR: 1 (ref 0.8–1.2)
Prothrombin Time: 10.5 s (ref 9.1–12.0)

## 2016-09-30 NOTE — Anesthesia Preprocedure Evaluation (Deleted)
Anesthesia Evaluation  Patient identified by MRN, date of birth, ID band Patient awake    Reviewed: Allergy & Precautions, H&P , NPO status , Patient's Chart, lab work & pertinent test results  Airway Mallampati: II   Neck ROM: full    Dental   Pulmonary sleep apnea ,    breath sounds clear to auscultation       Cardiovascular hypertension, + Peripheral Vascular Disease  + dysrhythmias Atrial Fibrillation  Rhythm:regular Rate:Normal  Last dose of eliquis was taken on Monday 04/19/16.   Neuro/Psych PSYCHIATRIC DISORDERS Anxiety    GI/Hepatic GERD  ,  Endo/Other    Renal/GU      Musculoskeletal  (+) Arthritis ,   Abdominal   Peds  Hematology   Anesthesia Other Findings   Reproductive/Obstetrics                             Anesthesia Physical  Anesthesia Plan  ASA: III  Anesthesia Plan: MAC   Post-op Pain Management:    Induction:   Airway Management Planned: Simple Face Mask  Additional Equipment:   Intra-op Plan:   Post-operative Plan:   Informed Consent: I have reviewed the patients History and Physical, chart, labs and discussed the procedure including the risks, benefits and alternatives for the proposed anesthesia with the patient or authorized representative who has indicated his/her understanding and acceptance.     Plan Discussed with: CRNA, Anesthesiologist and Surgeon  Anesthesia Plan Comments: (Echo 10/2016: Left ventricle: Systolic function was normal. The estimated   ejection fraction was in the range of 50% to 55%. The study is   not technically sufficient to allow evaluation of LV diastolic   function. - Left atrium: The appendage was moderately dilated. - Atrial septum: No defect or patent foramen ovale was identified.)        Anesthesia Quick Evaluation

## 2016-10-01 ENCOUNTER — Encounter (HOSPITAL_COMMUNITY): Payer: Self-pay | Admitting: Certified Registered Nurse Anesthetist

## 2016-10-01 ENCOUNTER — Ambulatory Visit (HOSPITAL_COMMUNITY)
Admission: RE | Admit: 2016-10-01 | Discharge: 2016-10-01 | Disposition: A | Discharge status: Home / Self Care | Payer: 59 | Source: Ambulatory Visit | Attending: Internal Medicine | Admitting: Internal Medicine

## 2016-10-01 ENCOUNTER — Encounter (HOSPITAL_COMMUNITY)
Admission: RE | Disposition: A | Discharge status: Home / Self Care | Payer: Self-pay | Source: Ambulatory Visit | Attending: Internal Medicine

## 2016-10-01 DIAGNOSIS — I4891 Unspecified atrial fibrillation: Secondary | ICD-10-CM | POA: Insufficient documentation

## 2016-10-01 DIAGNOSIS — Z538 Procedure and treatment not carried out for other reasons: Secondary | ICD-10-CM | POA: Insufficient documentation

## 2016-10-01 DIAGNOSIS — I481 Persistent atrial fibrillation: Secondary | ICD-10-CM

## 2016-10-01 SURGERY — CANCELLED PROCEDURE
Anesthesia: Monitor Anesthesia Care

## 2016-10-01 MED ORDER — SODIUM CHLORIDE 0.9 % IV SOLN: INTRAVENOUS | Status: DC

## 2016-10-01 NOTE — Progress Notes (Signed)
Patient admitted to Endo unit and placed on monitor. Patient appeared to be in NSR. EKG obtained and confirmed with MD to be NSR. Patient never entered procedure room. Patient discharged with instructions to continue current medications and keep any follow up appointments.

## 2016-10-01 NOTE — Progress Notes (Signed)
EKG this am shows sinus rhythm. Patient apparently has converted on flecainide. NO cardioversion was performed today. Continue this and her Eliquis and follow-up with Dr. Curt Bears.  Pixie Casino, MD, [facility] Attending Cardiologist [place]

## 2016-10-05 DIAGNOSIS — M25561 Pain in right knee: Secondary | ICD-10-CM | POA: Diagnosis not present

## 2016-10-06 DIAGNOSIS — Z961 Presence of intraocular lens: Secondary | ICD-10-CM | POA: Diagnosis not present

## 2016-10-06 DIAGNOSIS — H26492 Other secondary cataract, left eye: Secondary | ICD-10-CM | POA: Diagnosis not present

## 2016-10-25 ENCOUNTER — Other Ambulatory Visit: Payer: Self-pay | Admitting: Cardiology

## 2016-11-07 ENCOUNTER — Other Ambulatory Visit: Payer: Self-pay | Admitting: Cardiology

## 2016-11-29 DIAGNOSIS — G4733 Obstructive sleep apnea (adult) (pediatric): Secondary | ICD-10-CM | POA: Diagnosis not present

## 2016-12-21 ENCOUNTER — Ambulatory Visit (INDEPENDENT_AMBULATORY_CARE_PROVIDER_SITE_OTHER): Payer: 59 | Admitting: Cardiology

## 2016-12-21 ENCOUNTER — Encounter: Payer: Self-pay | Admitting: Cardiology

## 2016-12-21 VITALS — BP 136/84 | HR 66 | Ht 73.0 in | Wt 278.6 lb

## 2016-12-21 DIAGNOSIS — I491 Atrial premature depolarization: Secondary | ICD-10-CM | POA: Diagnosis not present

## 2016-12-21 DIAGNOSIS — I481 Persistent atrial fibrillation: Secondary | ICD-10-CM

## 2016-12-21 DIAGNOSIS — I4819 Other persistent atrial fibrillation: Secondary | ICD-10-CM

## 2016-12-21 MED ORDER — DILTIAZEM HCL ER COATED BEADS 180 MG PO CP24: 180.0000 mg | ORAL_CAPSULE | Freq: Every day | ORAL | 3 refills | Status: DC

## 2016-12-21 MED ORDER — FLECAINIDE ACETATE 50 MG PO TABS: 50.0000 mg | ORAL_TABLET | Freq: Two times a day (BID) | ORAL | 3 refills | Status: DC

## 2016-12-21 NOTE — Progress Notes (Signed)
Electrophysiology Office Note   Date:  12/21/2016   ID:  Deborah [name], DOB [birth year], MRN [id number]  PCP:  Gaynelle Arabian, MD  Cardiologist:  Irish Lack Primary Electrophysiologist:  Constance Haw, MD    Chief Complaint  Patient presents with  . Follow-up    Persistent Afib     History of Present Illness: Deborah [name] is a 61 y.o. female who presents today for electrophysiology evaluation.   She has a history of paroxysmal atrial fibrillation.  She had ablation on 12/04/15. She did well for a while, but did return back to atrial fibrillation after her flecainide was stopped. She was started on flecainide and had cardioversion on 10/01/16. When she presented for cardioversion, she was in sinus rhythm.   Today, denies symptoms of palpitations, chest pain, shortness of breath, orthopnea, PND, lower extremity edema, claudication, dizziness, presyncope, syncope, bleeding, or neurologic sequela. The patient is tolerating medications without difficulties. She does complain of fatigue. She says that she is been going to bed much earlier than she feels like she should be. She sleeps quite a bit at night as well.   Past Medical History:  Diagnosis Date  . A-fib ([place])   . Anemia    hx   . Anxiety   . Arthritis   . Dysrhythmia    atrial fib/flutter  . GERD (gastroesophageal reflux disease)    occ  . HTN (hypertension)   . Obesity   . OSA (obstructive sleep apnea)    cpap  . PAF (paroxysmal atrial fibrillation) ([place])    s/p DCCV 2010  . Peripheral vascular disease ([place]) 2016   after flying in leg   Past Surgical History:  Procedure Laterality Date  . BTL    . CARDIAC CATHETERIZATION Left 2016   cataract  . ELECTROPHYSIOLOGIC STUDY N/A 12/04/2015   Procedure: Atrial Fibrillation Ablation;  Surgeon: Amin Fornwalt Meredith Leeds, MD;  Location: [facility];  Service: Cardiovascular;  Laterality: N/A;  . TONSILLECTOMY AND ADENOIDECTOMY    . TOTAL KNEE ARTHROPLASTY Right  04/23/2016  . TOTAL KNEE ARTHROPLASTY Right 04/23/2016   Procedure: TOTAL KNEE ARTHROPLASTY;  Surgeon: [name], MD;  Location: [facility];  Service: Orthopedics;  Laterality: Right;  . TUBAL LIGATION       Current Outpatient Prescriptions  Medication Sig Dispense Refill  . ALPRAZolam (XANAX) 0.5 MG tablet Take 0.5 mg by mouth daily as needed for anxiety. Reported on 10/08/2015    . calcium carbonate (TUMS - DOSED IN MG ELEMENTAL CALCIUM) 500 MG chewable tablet Chew 1 tablet by mouth as directed.    Marland Kitchen ELIQUIS 5 MG TABS tablet TAKE 1 TABLET (5 MG TOTAL) BY MOUTH 2 (TWO) TIMES DAILY. 60 tablet 11  . escitalopram (LEXAPRO) 10 MG tablet Take 10 mg by mouth every evening.   3  . flecainide (TAMBOCOR) 100 MG tablet TAKE 1 TABLET BY MOUTH TWICE A DAY 60 tablet 2  . metoprolol succinate (TOPROL-XL) 100 MG 24 hr tablet Take 50 mg by mouth daily. Take with or immediately following a meal.    . oxyCODONE-acetaminophen (ROXICET) 5-325 MG tablet Take 1-2 tablets by mouth every 4 (four) hours as needed for moderate pain or severe pain. 84 tablet 0   No current facility-administered medications for this visit.     Allergies:   Benazepril; Adhesive [tape]; and Sulfa antibiotics   Social History:  The patient  reports that she has never smoked. She has never used smokeless tobacco. She reports that  she drinks alcohol. She reports that she does not use drugs.   Family History:  The patient's family history includes Heart disease in her mother; Stomach cancer in her paternal grandfather.    ROS:  Please see the history of present illness.   Otherwise, review of systems is positive for Fatigue.   All other systems are reviewed and negative.     PHYSICAL EXAM: VS:  BP 136/84   Pulse 66   Ht 6\' 1"  (1.854 m)   Wt 278 lb 9.6 oz (126.4 kg)   BMI 36.76 kg/m  , BMI Body mass index is 36.76 kg/m. GEN: Well nourished, well developed, in no acute distress  HEENT: normal  Neck: no JVD, carotid bruits, or  masses Cardiac: RRR; no murmurs, rubs, or gallops,no edema  Respiratory:  clear to auscultation bilaterally, normal work of breathing GI: soft, nontender, nondistended, + BS MS: no deformity or atrophy  Skin: warm and dry Neuro:  Strength and sensation are intact Psych: euthymic mood, full affect  EKG:  EKG is ordered today. Personal review of the ekg ordered shows sinus rhythm, right bundle branch block, QRS duration 186 ms   Recent Labs: 04/12/2016: ALT 17 09/23/2016: BUN 17; Creatinine, Ser 0.94; Hemoglobin 11.4; Platelets 266; Potassium 4.5; Sodium 145    Lipid Panel  No results found for: CHOL, TRIG, HDL, CHOLHDL, VLDL, LDLCALC, LDLDIRECT   Wt Readings from Last 3 Encounters:  12/21/16 278 lb 9.6 oz (126.4 kg)  09/23/16 272 lb (123.4 kg)  04/23/16 280 lb 14.4 oz (127.4 kg)      Other studies Reviewed: Additional studies/ records that were reviewed today include: TTE 11/25/15 - Left ventricle: Systolic function was normal. The estimated   ejection fraction was in the range of 50% to 55%. The study is   not technically sufficient to allow evaluation of LV diastolic   function. - Left atrium: The appendage was moderately dilated. - Atrial septum: No defect or patent foramen ovale was identified.   ASSESSMENT AND PLAN:  1.  Persistent atrial fibrillation: Had AF ablation on 12/04/15. Did have return to atrial fibrillation post ablation and was started on flecainide. When she presented for cardioversion she was in sinus rhythm. Unfortunately today, her QRS has widened 286 ms. We'll decrease her flecainide to 50 mg and have her come back in 2 weeks for a repeat EKG. As she is fatigued, we'll plan to stop her metoprolol and start her on 180 mg of diltiazem.  This patients CHA2DS2-VASc Score and unadjusted Ischemic Stroke Rate (% per year) is equal to 2.2 % stroke rate/year from a score of 2  Above score calculated as 1 point each if present [CHF, HTN, DM,  Vascular=MI/PAD/Aortic Plaque, Age if 65-74, or Female] Above score calculated as 2 points each if present [Age > 75, or Stroke/TIA/TE]   2. Hypertension: Well-controlled today   Current medicines are reviewed at length with the patient today.   The patient does not have concerns regarding her medicines.  The following changes were made today:  Stop metoprolol, decreased flecainide, start diltiazem  Labs/ tests ordered today include: cardioversion Orders Placed This Encounter  Procedures  . EKG 12-Lead     Disposition:   FU with Shantale Holtmeyer 6 months  Signed, Jamyria Ozanich Meredith Leeds, MD  12/21/2016 3:44 PM     [facility] [address] [place] [address] [phone] (office) [phone] (fax)

## 2016-12-21 NOTE — Patient Instructions (Addendum)
Medication Instructions:   Your physician has recommended you make the following change in your medication:  1) DECREASE Flecainide to 50 mg twice daily 2) STOP Metoprolol 3) START Diltiazem 180 mg once daily  - If you need a refill on your cardiac medications before your next appointment, please call your pharmacy.   Labwork:  None ordered  Testing/Procedures:  None ordered  Follow-Up:  Your physician recommends that you schedule a follow-up appointment in: a couple of weeks for EKG after decreasing Flecainide.   Your physician wants you to follow-up in: 6 months with Dr. Curt Bears.  You will receive a reminder letter in the mail two months in advance. If you don't receive a letter, please call our office to schedule the follow-up appointment.  Thank you for choosing [facility]!!   Trinidad Curet, RN [phone]  Any Other Special Instructions Will Be Listed Below (If Applicable).

## 2016-12-23 ENCOUNTER — Telehealth: Payer: Self-pay | Admitting: *Deleted

## 2016-12-23 MED ORDER — METOPROLOL SUCCINATE ER 50 MG PO TB24: 50.0000 mg | ORAL_TABLET | Freq: Every day | ORAL | 3 refills | Status: DC

## 2016-12-23 NOTE — Telephone Encounter (Signed)
Called patient to reschedule her for nurse visit EKG in several weeks after decreasing Flecainide (she was scheduled on Camnitz's schedule).  Patient scheduled for 7/17 and she is agreeable to date/time.  Patient also tells me that she has decided to stay on Metoprolol and not switch to Diltiazem.  She states she read up on the medication and decided she did not want to risk the SE listed for it.  She states " I will stick with the beast I know instead of starting with a new one".   (Diltiazem was suggested due to fatigue she was experiencing).   Patient advised I would forward information to Dr. Curt Bears for advisement.  I suggested that he should be agreeable to staying on Toprol 50.  Pt mentions they discussed possibly decreasing Metoprolol dosage to see if improves fatigue symptom --- advised pt that Camnitz will want to wait to decrease Metoprolol until after 7/17 EKG.  We will want to see how she is doing on decreased Flecainide before determining if we can go down on Metoprolol dosing.   Pt understands and is agreeable.  She understands I will call her once reviewed/advised on by Dr. Curt Bears  (she wants confirmation he approves remaining on Toprol)

## 2016-12-23 NOTE — Telephone Encounter (Signed)
Informed patient. She understands I will follow up next month after EKG

## 2016-12-23 NOTE — Telephone Encounter (Signed)
Follow up     Pt is returning your call from earlier

## 2016-12-23 NOTE — Telephone Encounter (Signed)
lmtcb

## 2017-01-10 ENCOUNTER — Ambulatory Visit: Payer: 59 | Admitting: Cardiology

## 2017-01-11 ENCOUNTER — Ambulatory Visit (INDEPENDENT_AMBULATORY_CARE_PROVIDER_SITE_OTHER): Payer: 59

## 2017-01-11 VITALS — BP 128/80 | HR 75 | Ht 73.0 in | Wt 280.0 lb

## 2017-01-11 DIAGNOSIS — Z79899 Other long term (current) drug therapy: Secondary | ICD-10-CM

## 2017-01-11 DIAGNOSIS — I480 Paroxysmal atrial fibrillation: Secondary | ICD-10-CM

## 2017-01-11 NOTE — Progress Notes (Signed)
1.) Reason for visit: EKG  2.) Name of MD requesting visit: Dr. Curt Bears  3.) H&P: Patient has a history of PAF. At last p OV patient's Flecainide was decreased to 50 mg BID, and returning today for EKG to check her QRS duration (pervious 186).              See phone note from 12/23/16 with Dr. Curt Bears                nurse :   "Patient also tells me that she has decided to stay on Metoprolol and not switch to Diltiazem.  She states she read up on the medication and decided she did not want to risk the SE listed for it.  She states " I will stick with the beast I know instead of starting with a new one".   (Diltiazem was suggested due to fatigue she was experiencing).   Patient advised I would forward information to Dr. Curt Bears for advisement.  I suggested that he should be agreeable to staying on Toprol 50.  Pt mentions they discussed possibly decreasing Metoprolol dosage to see if improves fatigue symptom --- advised pt that Camnitz will want to wait to decrease Metoprolol until after 7/17 EKG.  We will want to see how she is doing on decreased Flecainide before determining if we can go down on Metoprolol dosing.   Pt understands and is agreeable.  She understands I will call her once reviewed/advised on by Dr. Curt Bears  (she wants confirmation he approves remaining on Toprol) "  4.) ROS related to problem: Patient stated she still feels some fatigue, but it's much better than it was at her last office visit. Patient's VS BP 128/80, HR 75, and  O2 95% on room air. Patient's EKG showed Normal sinus rhythm, RBBB, and QRS duration 164.   5.) Assessment and plan per MD: Had Dr. Lovena Le (DOD) review EKG. No changes at this time, will refer to Dr. Curt Bears.

## 2017-01-11 NOTE — Patient Instructions (Signed)
Medication Instructions:  Your physician recommends that you continue on your current medications as directed. Please refer to the Current Medication list given to you today.  Labwork: NONE  Testing/Procedures: NONE  Follow-Up: Your physician wants you to follow-up as directed.  If you need a refill on your cardiac medications before your next appointment, please call your pharmacy.

## 2017-01-14 NOTE — Telephone Encounter (Signed)
lmtcb

## 2017-01-17 ENCOUNTER — Telehealth: Payer: Self-pay | Admitting: Cardiology

## 2017-01-17 NOTE — Telephone Encounter (Signed)
New message  ° ° ° °Patient returning call back to nurse from Friday.   °

## 2017-01-18 ENCOUNTER — Telehealth: Payer: Self-pay | Admitting: Cardiology

## 2017-01-18 MED ORDER — DRONEDARONE HCL 400 MG PO TABS: 400.0000 mg | ORAL_TABLET | Freq: Two times a day (BID) | ORAL | 6 refills | Status: DC

## 2017-01-18 NOTE — Telephone Encounter (Signed)
Advised to stop Flecainide today.  (She has taken her morning dose today) Advised to start Multaq 400 mg BID on Friday 7/27. EKG follow up on 8/9. Pt understands we will determine if sooner f/u is needed after EKG next month. Patient verbalized understanding and agreeable to plan.

## 2017-01-18 NOTE — Telephone Encounter (Signed)
New message    Mickel Baas from [facility] is calling about pt medication.    Pt c/o medication issue:  1. Name of Medication: Multaq 400 mg  2. How are you currently taking this medication (dosage and times per day)? Twice daily  3. Are you having a reaction (difficulty breathing--STAT)?   4. What is your medication issue? Mickel Baas states there is an interaction between this medication and Lexapro 10 mg. She ask if it's ok for pt to take both?

## 2017-01-18 NOTE — Telephone Encounter (Signed)
Pharmacist calling to verify when we were starting Multaq and that pt is taking Lexapro also.  Informed pharmacist Lexapro is a low dose and we will be monitoring pt while she is taking Multaq.   Informed we would be performing f/u EKG 2 weeks after Multaq initiation and following pt regularly in the office. Mickel Baas thanks me for the information and will note EKG in pt's account.

## 2017-01-19 ENCOUNTER — Telehealth: Payer: Self-pay | Admitting: Cardiology

## 2017-01-19 NOTE — Telephone Encounter (Signed)
Reviewed Multaq with patient, answered questions.  Pt will start medication and call if she has any concerns/changes after starting. She is appreciative of call and taking time to talk with her.

## 2017-01-19 NOTE — Telephone Encounter (Signed)
New Message     Wants to discuss the change in her medication from Flecainide to  dronedarone (MULTAQ) 400 MG tablet Take 1 tablet (400 mg total) by mouth 2 (two) times daily with a meal

## 2017-01-25 ENCOUNTER — Ambulatory Visit: Payer: 59 | Admitting: Cardiology

## 2017-02-01 DIAGNOSIS — I10 Essential (primary) hypertension: Secondary | ICD-10-CM | POA: Diagnosis not present

## 2017-02-01 DIAGNOSIS — Z23 Encounter for immunization: Secondary | ICD-10-CM | POA: Diagnosis not present

## 2017-02-01 DIAGNOSIS — I480 Paroxysmal atrial fibrillation: Secondary | ICD-10-CM | POA: Diagnosis not present

## 2017-02-03 ENCOUNTER — Encounter (INDEPENDENT_AMBULATORY_CARE_PROVIDER_SITE_OTHER): Payer: Self-pay

## 2017-02-03 ENCOUNTER — Ambulatory Visit (INDEPENDENT_AMBULATORY_CARE_PROVIDER_SITE_OTHER): Payer: 59 | Admitting: *Deleted

## 2017-02-03 DIAGNOSIS — Z79899 Other long term (current) drug therapy: Secondary | ICD-10-CM

## 2017-02-03 DIAGNOSIS — I480 Paroxysmal atrial fibrillation: Secondary | ICD-10-CM | POA: Diagnosis not present

## 2017-02-03 NOTE — Patient Instructions (Signed)
ekg done for multaq start.

## 2017-02-14 DIAGNOSIS — R300 Dysuria: Secondary | ICD-10-CM | POA: Diagnosis not present

## 2017-02-14 DIAGNOSIS — R109 Unspecified abdominal pain: Secondary | ICD-10-CM | POA: Diagnosis not present

## 2017-02-22 DIAGNOSIS — H26492 Other secondary cataract, left eye: Secondary | ICD-10-CM | POA: Diagnosis not present

## 2017-02-22 DIAGNOSIS — H35052 Retinal neovascularization, unspecified, left eye: Secondary | ICD-10-CM | POA: Diagnosis not present

## 2017-02-22 DIAGNOSIS — H2511 Age-related nuclear cataract, right eye: Secondary | ICD-10-CM | POA: Diagnosis not present

## 2017-05-29 ENCOUNTER — Other Ambulatory Visit: Payer: Self-pay | Admitting: Cardiology

## 2017-06-16 ENCOUNTER — Ambulatory Visit: Payer: 59 | Admitting: Cardiology

## 2017-07-27 ENCOUNTER — Ambulatory Visit: Payer: 59 | Admitting: Cardiology

## 2017-07-27 ENCOUNTER — Encounter (INDEPENDENT_AMBULATORY_CARE_PROVIDER_SITE_OTHER): Payer: Self-pay

## 2017-07-27 ENCOUNTER — Encounter: Payer: Self-pay | Admitting: Cardiology

## 2017-07-27 VITALS — BP 110/78 | HR 65 | Ht 73.0 in | Wt 274.0 lb

## 2017-07-27 DIAGNOSIS — I10 Essential (primary) hypertension: Secondary | ICD-10-CM | POA: Diagnosis not present

## 2017-07-27 DIAGNOSIS — I480 Paroxysmal atrial fibrillation: Secondary | ICD-10-CM | POA: Diagnosis not present

## 2017-07-27 MED ORDER — DILTIAZEM HCL ER COATED BEADS 180 MG PO CP24: 180.0000 mg | ORAL_CAPSULE | Freq: Every day | ORAL | 2 refills | Status: DC

## 2017-07-27 NOTE — Progress Notes (Signed)
Electrophysiology Office Note   Date:  07/27/2017   ID:  ARDEN AXON, DOB [birth year], MRN [id number]  PCP:  Deborah Arabian, MD  Cardiologist:  Deborah [name] Primary Electrophysiologist:  Deborah Haw, MD    Chief Complaint  Patient presents with  . Follow-up    Persistent Afib     History of Present Illness: Deborah [name] is a 62 y.o. female who presents today for electrophysiology evaluation.   She has a history of paroxysmal atrial fibrillation.  She had ablation on 12/04/15. She did well for a while, but did return back to atrial fibrillation after her flecainide was stopped. She was started on flecainide and had cardioversion on 10/01/16. When she presented for cardioversion, she was in sinus rhythm.  Her flecainide dose was reduced as her QRS had lengthened at the last visit, this was since stopped and she was started on Multaq.  Today, denies symptoms of palpitations, chest pain, shortness of breath, orthopnea, PND, lower extremity edema, claudication, dizziness, presyncope, syncope, bleeding, or neurologic sequela. The patient is tolerating medications without difficulties.  She is continuing to feel fatigued.  She did not stop her Toprol-XL.  She is otherwise feeling well.  She has noted no further episodes of atrial fibrillation.   Past Medical History:  Diagnosis Date  . A-fib ([place])   . Anemia    hx   . Anxiety   . Arthritis   . Dysrhythmia    atrial fib/flutter  . GERD (gastroesophageal reflux disease)    occ  . HTN (hypertension)   . Obesity   . OSA (obstructive sleep apnea)    cpap  . PAF (paroxysmal atrial fibrillation) ([place])    s/p DCCV 2010  . Peripheral vascular disease ([place]) 2016   after flying in leg   Past Surgical History:  Procedure Laterality Date  . BTL    . CARDIAC CATHETERIZATION Left 2016   cataract  . ELECTROPHYSIOLOGIC STUDY N/A 12/04/2015   Procedure: Atrial Fibrillation Ablation;  Surgeon: Deborah Sawchuk Meredith Leeds, MD;  Location: [facility];  Service: Cardiovascular;  Laterality: N/A;  . TONSILLECTOMY AND ADENOIDECTOMY    . TOTAL KNEE ARTHROPLASTY Right 04/23/2016  . TOTAL KNEE ARTHROPLASTY Right 04/23/2016   Procedure: TOTAL KNEE ARTHROPLASTY;  Surgeon: Deborah Server, MD;  Location: [facility];  Service: Orthopedics;  Laterality: Right;  . TUBAL LIGATION       Current Outpatient Medications  Medication Sig Dispense Refill  . ALPRAZolam (XANAX) 0.5 MG tablet Take 0.5 mg by mouth daily as needed for anxiety. Reported on 10/08/2015    . calcium carbonate (TUMS - DOSED IN MG ELEMENTAL CALCIUM) 500 MG chewable tablet Chew 1 tablet by mouth as directed.    Marland Kitchen ELIQUIS 5 MG TABS tablet TAKE 1 TABLET (5 MG TOTAL) BY MOUTH 2 (TWO) TIMES DAILY. 60 tablet 11  . escitalopram (LEXAPRO) 10 MG tablet Take 10 mg by mouth every evening.   3  . MULTAQ 400 MG tablet TAKE 1 TABLET (400 MG TOTAL) BY MOUTH 2 (TWO) TIMES DAILY WITH A MEAL. 60 tablet 6  . oxyCODONE-acetaminophen (ROXICET) 5-325 MG tablet Take 1-2 tablets by mouth every 4 (four) hours as needed for moderate pain or severe pain. 84 tablet 0  . diltiazem (CARDIZEM CD) 180 MG 24 hr capsule Take 1 capsule (180 mg total) by mouth daily. 90 capsule 2   No current facility-administered medications for this visit.     Allergies:   Benazepril; Adhesive [tape]; and  Sulfa antibiotics   Social History:  The patient  reports that  has never smoked. she has never used smokeless tobacco. She reports that she drinks alcohol. She reports that she does not use drugs.   Family History:  The patient's family history includes Heart disease in her mother; Stomach cancer in her paternal grandfather.    ROS:  Please see the history of present illness.   Otherwise, review of systems is positive for fatigue, DOE.   All other systems are reviewed and negative.   PHYSICAL EXAM: VS:  BP 110/78   Pulse 65   Ht 6\' 1"  (1.854 m)   Wt 274 lb (124.3 kg)   BMI 36.15 kg/m  , BMI Body mass index is 36.15  kg/m. GEN: Well nourished, well developed, in no acute distress  HEENT: normal  Neck: no JVD, carotid bruits, or masses Cardiac: RRR; no murmurs, rubs, or gallops,no edema  Respiratory:  clear to auscultation bilaterally, normal work of breathing GI: soft, nontender, nondistended, + BS MS: no deformity or atrophy  Skin: warm and dry Neuro:  Strength and sensation are intact Psych: euthymic mood, full affect  EKG:  EKG is ordered today. Personal review of the ekg ordered shows SR, RBBB, rate 65    Recent Labs: 09/23/2016: BUN 17; Creatinine, Ser 0.94; Hemoglobin 11.4; Platelets 266; Potassium 4.5; Sodium 145    Lipid Panel  No results found for: CHOL, TRIG, HDL, CHOLHDL, VLDL, LDLCALC, LDLDIRECT   Wt Readings from Last 3 Encounters:  07/27/17 274 lb (124.3 kg)  01/11/17 280 lb (127 kg)  12/21/16 278 lb 9.6 oz (126.4 kg)      Other studies Reviewed: Additional studies/ records that were reviewed today include: TTE 11/25/15 - Left ventricle: Systolic function was normal. The estimated   ejection fraction was in the range of 50% to 55%. The study is   not technically sufficient to allow evaluation of LV diastolic   function. - Left atrium: The appendage was moderately dilated. - Atrial septum: No defect or patent foramen ovale was identified.   ASSESSMENT AND PLAN:  1.  Persistent atrial fibrillation: Had AF ablation on 12/04/15.  Return to atrial fibrillation was put on flecainide, but her QRS widened.  This was stopped and she was put on Multaq.  He has done well with this other than having some fatigue.  We Deborah [name] thus stop her Toprol-XL and start her on diltiazem 180 mg.  This patients CHA2DS2-VASc Score and unadjusted Ischemic Stroke Rate (% per year) is equal to 2.2 % stroke rate/year from a score of 2  Above score calculated as 1 point each if present [CHF, HTN, DM, Vascular=MI/PAD/Aortic Plaque, Age if 65-74, or Female] Above score calculated as 2 points each if  present [Age > 75, or Stroke/TIA/TE]   2. Hypertension: Well-controlled today.  No changes.   Current medicines are reviewed at length with the patient today.   The patient does not have concerns regarding her medicines.  The following changes were made today: Stop Toprol-XL, start diltiazem  Labs/ tests ordered today include: cardioversion Orders Placed This Encounter  Procedures  . EKG 12-Lead     Disposition:   FU with Deborah [name] 6 months  Signed, Nadim Malia Meredith Leeds, MD  07/27/2017 9:42 AM     [facility] [address] [place] [address] [phone] (office) [phone] (fax)

## 2017-07-27 NOTE — Patient Instructions (Addendum)
Medication Instructions:  Your physician has recommended you make the following change in your medication:  1. STOP Metoprolol 2. START Diltiazem 180 mg daily  * If you need a refill on your cardiac medications before your next appointment, please call your pharmacy. *  Labwork: None ordered  Testing/Procedures: None ordered  Follow-Up: Your physician wants you to follow-up in: 6 months with Dr. Curt Bears.  You will receive a reminder letter in the mail two months in advance. If you don't receive a letter, please call our office to schedule the follow-up appointment.  Thank you for choosing [facility]!!   Deborah Curet, RN [phone]  Any Other Special Instructions Will Be Listed Below (If Applicable). Diltiazem extended-release capsules or tablets What is this medicine? DILTIAZEM (dil TYE a zem) is a calcium-channel blocker. It affects the amount of calcium found in your heart and muscle cells. This relaxes your blood vessels, which can reduce the amount of work the heart has to do. This medicine is used to treat high blood pressure and chest pain caused by angina. This medicine may be used for other purposes; ask your health care provider or pharmacist if you have questions. COMMON BRAND NAME(S): Cardizem CD, Cardizem LA, Cardizem SR, Cartia XT, Dilacor XR, Dilt-CD, Diltia XT, Diltzac, Matzim LA, Rema Fendt, Tiamate, Tiazac What should I tell my health care provider before I take this medicine? They need to know if you have any of these conditions: -heart problems, low blood pressure, irregular heartbeat -liver disease -previous heart attack -an unusual or allergic reaction to diltiazem, other medicines, foods, dyes, or preservatives -pregnant or trying to get pregnant -breast-feeding How should I use this medicine? Take this medicine by mouth with a glass of water. Follow the directions on the prescription label. Swallow whole, do not crush or chew. Ask your doctor or  pharmacist if your should take this medicine with food. Take your doses at regular intervals. Do not take your medicine more often then directed. Do not stop taking except on the advice of your doctor or health care professional. Ask your doctor or health care professional how to gradually reduce the dose. Talk to your pediatrician regarding the use of this medicine in children. Special care may be needed. Overdosage: If you think you have taken too much of this medicine contact a poison control center or emergency room at once. NOTE: This medicine is only for you. Do not share this medicine with others. What if I miss a dose? If you miss a dose, take it as soon as you can. If it is almost time for your next dose, take only that dose. Do not take double or extra doses. What may interact with this medicine? Do not take this medicine with any of the following medications: -cisapride -hawthorn -pimozide -ranolazine -red yeast rice This medicine may also interact with the following medications: -buspirone -carbamazepine -cimetidine -cyclosporine -digoxin -local anesthetics or general anesthetics -lovastatin -medicines for anxiety or difficulty sleeping like midazolam and triazolam -medicines for high blood pressure or heart problems -quinidine -rifampin, rifabutin, or rifapentine This list may not describe all possible interactions. Give your health care provider a list of all the medicines, herbs, non-prescription drugs, or dietary supplements you use. Also tell them if you smoke, drink alcohol, or use illegal drugs. Some items may interact with your medicine. What should I watch for while using this medicine? Check your blood pressure and pulse rate regularly. Ask your doctor or health care professional what your blood  pressure and pulse rate should be and when you should contact him or her. You may feel dizzy or lightheaded. Do not drive, use machinery, or do anything that needs mental  alertness until you know how this medicine affects you. To reduce the risk of dizzy or fainting spells, do not sit or stand up quickly, especially if you are an older patient. Alcohol can make you more dizzy or increase flushing and rapid heartbeats. Avoid alcoholic drinks. What side effects may I notice from receiving this medicine? Side effects that you should report to your doctor or health care professional as soon as possible: -allergic reactions like skin rash, itching or hives, swelling of the face, lips, or tongue -confusion, mental depression -feeling faint or lightheaded, falls -redness, blistering, peeling or loosening of the skin, including inside the mouth -slow, irregular heartbeat -swelling of the feet and ankles -unusual bleeding or bruising, pinpoint red spots on the skin Side effects that usually do not require medical attention (report to your doctor or health care professional if they continue or are bothersome): -constipation or diarrhea -difficulty sleeping -facial flushing -headache -nausea, vomiting -sexual dysfunction -weak or tired This list may not describe all possible side effects. Call your doctor for medical advice about side effects. You may report side effects to FDA at 1-800-FDA-1088. Where should I keep my medicine? Keep out of the reach of children. Store at room temperature between 15 and 30 degrees C (59 and 86 degrees F). Protect from humidity. Throw away any unused medicine after the expiration date. NOTE: This sheet is a summary. It may not cover all possible information. If you have questions about this medicine, talk to your doctor, pharmacist, or health care provider.  2018 Elsevier/Gold Standard (2007-10-05 14:35:47)

## 2017-08-05 DIAGNOSIS — G629 Polyneuropathy, unspecified: Secondary | ICD-10-CM | POA: Diagnosis not present

## 2017-08-09 DIAGNOSIS — E538 Deficiency of other specified B group vitamins: Secondary | ICD-10-CM | POA: Diagnosis not present

## 2017-08-10 DIAGNOSIS — D518 Other vitamin B12 deficiency anemias: Secondary | ICD-10-CM | POA: Diagnosis not present

## 2017-08-11 DIAGNOSIS — D518 Other vitamin B12 deficiency anemias: Secondary | ICD-10-CM | POA: Diagnosis not present

## 2017-08-12 DIAGNOSIS — E538 Deficiency of other specified B group vitamins: Secondary | ICD-10-CM | POA: Diagnosis not present

## 2017-08-15 DIAGNOSIS — D518 Other vitamin B12 deficiency anemias: Secondary | ICD-10-CM | POA: Diagnosis not present

## 2017-08-16 DIAGNOSIS — E538 Deficiency of other specified B group vitamins: Secondary | ICD-10-CM | POA: Diagnosis not present

## 2017-08-17 DIAGNOSIS — E538 Deficiency of other specified B group vitamins: Secondary | ICD-10-CM | POA: Diagnosis not present

## 2017-08-18 DIAGNOSIS — D518 Other vitamin B12 deficiency anemias: Secondary | ICD-10-CM | POA: Diagnosis not present

## 2017-08-19 DIAGNOSIS — E538 Deficiency of other specified B group vitamins: Secondary | ICD-10-CM | POA: Diagnosis not present

## 2017-08-22 DIAGNOSIS — G629 Polyneuropathy, unspecified: Secondary | ICD-10-CM | POA: Diagnosis not present

## 2017-08-23 DIAGNOSIS — G629 Polyneuropathy, unspecified: Secondary | ICD-10-CM | POA: Diagnosis not present

## 2017-08-26 DIAGNOSIS — G629 Polyneuropathy, unspecified: Secondary | ICD-10-CM | POA: Diagnosis not present

## 2017-08-26 DIAGNOSIS — E538 Deficiency of other specified B group vitamins: Secondary | ICD-10-CM | POA: Diagnosis not present

## 2017-08-29 DIAGNOSIS — E538 Deficiency of other specified B group vitamins: Secondary | ICD-10-CM | POA: Diagnosis not present

## 2017-08-31 DIAGNOSIS — G629 Polyneuropathy, unspecified: Secondary | ICD-10-CM | POA: Diagnosis not present

## 2017-09-01 DIAGNOSIS — Z01419 Encounter for gynecological examination (general) (routine) without abnormal findings: Secondary | ICD-10-CM | POA: Diagnosis not present

## 2017-09-02 DIAGNOSIS — G629 Polyneuropathy, unspecified: Secondary | ICD-10-CM | POA: Diagnosis not present

## 2017-09-05 DIAGNOSIS — E538 Deficiency of other specified B group vitamins: Secondary | ICD-10-CM | POA: Diagnosis not present

## 2017-09-07 DIAGNOSIS — E538 Deficiency of other specified B group vitamins: Secondary | ICD-10-CM | POA: Diagnosis not present

## 2017-09-09 DIAGNOSIS — E538 Deficiency of other specified B group vitamins: Secondary | ICD-10-CM | POA: Diagnosis not present

## 2017-09-09 DIAGNOSIS — G4733 Obstructive sleep apnea (adult) (pediatric): Secondary | ICD-10-CM | POA: Diagnosis not present

## 2017-09-13 DIAGNOSIS — G629 Polyneuropathy, unspecified: Secondary | ICD-10-CM | POA: Diagnosis not present

## 2017-09-13 DIAGNOSIS — E538 Deficiency of other specified B group vitamins: Secondary | ICD-10-CM | POA: Diagnosis not present

## 2017-09-14 ENCOUNTER — Encounter: Payer: Self-pay | Admitting: Neurology

## 2017-09-22 DIAGNOSIS — L821 Other seborrheic keratosis: Secondary | ICD-10-CM | POA: Diagnosis not present

## 2017-09-22 DIAGNOSIS — D2262 Melanocytic nevi of left upper limb, including shoulder: Secondary | ICD-10-CM | POA: Diagnosis not present

## 2017-09-22 DIAGNOSIS — D2261 Melanocytic nevi of right upper limb, including shoulder: Secondary | ICD-10-CM | POA: Diagnosis not present

## 2017-09-22 DIAGNOSIS — D2372 Other benign neoplasm of skin of left lower limb, including hip: Secondary | ICD-10-CM | POA: Diagnosis not present

## 2017-09-22 DIAGNOSIS — D485 Neoplasm of uncertain behavior of skin: Secondary | ICD-10-CM | POA: Diagnosis not present

## 2017-10-18 ENCOUNTER — Other Ambulatory Visit: Payer: Self-pay | Admitting: Cardiology

## 2017-10-18 NOTE — Telephone Encounter (Signed)
Eliquis 5mg  refill request received; pt is 61 yrs old , wt- 124.3kg, Crea-1.49 via PCP at [facility], last seen by Dr. Curt Bears on 07/27/17; will send in refill to requested pharmacy.

## 2017-10-20 DIAGNOSIS — D2372 Other benign neoplasm of skin of left lower limb, including hip: Secondary | ICD-10-CM | POA: Diagnosis not present

## 2017-10-20 DIAGNOSIS — D485 Neoplasm of uncertain behavior of skin: Secondary | ICD-10-CM | POA: Diagnosis not present

## 2017-11-22 ENCOUNTER — Telehealth: Payer: Self-pay | Admitting: Cardiology

## 2017-11-22 DIAGNOSIS — Z961 Presence of intraocular lens: Secondary | ICD-10-CM | POA: Diagnosis not present

## 2017-11-22 DIAGNOSIS — H35052 Retinal neovascularization, unspecified, left eye: Secondary | ICD-10-CM | POA: Diagnosis not present

## 2017-11-22 DIAGNOSIS — H2511 Age-related nuclear cataract, right eye: Secondary | ICD-10-CM | POA: Insufficient documentation

## 2017-11-22 DIAGNOSIS — H3581 Retinal edema: Secondary | ICD-10-CM | POA: Diagnosis not present

## 2017-11-22 MED ORDER — METOPROLOL SUCCINATE ER 50 MG PO TB24: 50.0000 mg | ORAL_TABLET | Freq: Every day | ORAL | 3 refills | Status: DC

## 2017-11-22 NOTE — Telephone Encounter (Signed)
Advised pt to stop Diltiazem and restart Toprol, per Dr. Curt Bears Pt would like to discuss repeat ablation w/ Dr. Curt Bears -- scheduled to discuss with him Monday, 6/3. Patient verbalized understanding and agreeable to plan.

## 2017-11-22 NOTE — Telephone Encounter (Signed)
New message    Patient calling states her heart is out of rhythm.      1) How long have you had palpitations/irregular HR/ Afib? Are you having the symptoms now?   2) Are you currently experiencing lightheadedness, SOB or CP? NO  3) Do you have a history of afib (atrial fibrillation) or irregular heart rhythm? YES  4) Have you checked your BP or HR? (document readings if available): 190/90 HR64  5) Are you experiencing any other symptoms? NO

## 2017-11-22 NOTE — Telephone Encounter (Signed)
Been out of rhythm since Saturday. Denies any symptoms r/t Afib.  HR is controlled. Doesn't ordinarily check BP at home, only when she feels different.  When she felt strange over the weekend she took it and it was elevated.  Reports BP > 160s/80s since Saturday. Pt states that she did fine when she was on Metoprolol and would like to return to that if agreeable by Dr. Curt Bears.  She understands I will discuss this with him this afternoon and call her back w/ advisement. Patient verbalized understanding and agreeable to plan.

## 2017-11-24 ENCOUNTER — Other Ambulatory Visit: Payer: Self-pay | Admitting: Cardiology

## 2017-11-28 ENCOUNTER — Encounter: Payer: Self-pay | Admitting: Cardiology

## 2017-11-28 ENCOUNTER — Ambulatory Visit: Payer: 59 | Admitting: Cardiology

## 2017-11-28 VITALS — BP 126/84 | HR 75 | Ht 74.0 in | Wt 278.6 lb

## 2017-11-28 DIAGNOSIS — I10 Essential (primary) hypertension: Secondary | ICD-10-CM

## 2017-11-28 DIAGNOSIS — I481 Persistent atrial fibrillation: Secondary | ICD-10-CM

## 2017-11-28 DIAGNOSIS — I4819 Other persistent atrial fibrillation: Secondary | ICD-10-CM

## 2017-11-28 NOTE — Progress Notes (Signed)
Electrophysiology Office Note   Date:  11/28/2017   ID:  Deborah [name], DOB 01/12/1956, MRN [id number]  PCP:  Gaynelle Arabian, MD  Cardiologist:  Irish Lack Primary Electrophysiologist:  Constance Haw, MD    Chief Complaint  Patient presents with  . Follow-up    PAF     History of Present Illness: Deborah [name] is a 62 y.o. female who presents today for electrophysiology evaluation.   She has a history of paroxysmal atrial fibrillation.  She had ablation on 12/04/15. She did well for a while, but did return back to atrial fibrillation after her flecainide was stopped. She was started on flecainide and had cardioversion on 10/01/16. When she presented for cardioversion, she was in sinus rhythm.  Her flecainide dose was reduced as her QRS had lengthened at the last visit, this was since stopped and she was started on Multaq.  Today, denies symptoms of palpitations, chest pain, shortness of breath, orthopnea, PND, lower extremity edema, claudication, dizziness, presyncope, syncope, bleeding, or neurologic sequela. The patient is tolerating medications without difficulties.  His last being seen, she has been going in and out of atrial fibrillation.  Her main symptoms are weakness, fatigue, and shortness of breath.  She does not feel like her antiarrhythmics are working as efficiently as they should be.  She is interested in repeat ablation.   Past Medical History:  Diagnosis Date  . A-fib ([place])   . Anemia    hx   . Anxiety   . Arthritis   . Dysrhythmia    atrial fib/flutter  . GERD (gastroesophageal reflux disease)    occ  . HTN (hypertension)   . Obesity   . OSA (obstructive sleep apnea)    cpap  . PAF (paroxysmal atrial fibrillation) ([name])    s/p DCCV 2010  . Peripheral vascular disease ([name]) 2016   after flying in leg   Past Surgical History:  Procedure Laterality Date  . BTL    . CARDIAC CATHETERIZATION Left 2016   cataract  . ELECTROPHYSIOLOGIC STUDY N/A 12/04/2015   Procedure: Atrial Fibrillation Ablation;  Surgeon: [name], MD;  Location: [facility];  Service: Cardiovascular;  Laterality: N/A;  . TONSILLECTOMY AND ADENOIDECTOMY    . TOTAL KNEE ARTHROPLASTY Right 04/23/2016  . TOTAL KNEE ARTHROPLASTY Right 04/23/2016   Procedure: TOTAL KNEE ARTHROPLASTY;  Surgeon: [name], MD;  Location: [facility];  Service: Orthopedics;  Laterality: Right;  . TUBAL LIGATION       Current Outpatient Medications  Medication Sig Dispense Refill  . ALPRAZolam (XANAX) 0.5 MG tablet Take 0.5 mg by mouth daily as needed for anxiety. Reported on 10/08/2015    . calcium carbonate (TUMS - DOSED IN MG ELEMENTAL CALCIUM) 500 MG chewable tablet Chew 1 tablet by mouth as directed.    Marland Kitchen ELIQUIS 5 MG TABS tablet TAKE 1 TABLET (5 MG TOTAL) BY MOUTH 2 (TWO) TIMES DAILY. 60 tablet 11  . escitalopram (LEXAPRO) 10 MG tablet Take 10 mg by mouth every evening.   3  . metoprolol succinate (TOPROL-XL) 50 MG 24 hr tablet Take 1 tablet (50 mg total) by mouth daily. Take with or immediately following a meal. 30 tablet 3  . oxyCODONE-acetaminophen (ROXICET) 5-325 MG tablet Take 1-2 tablets by mouth every 4 (four) hours as needed for moderate pain or severe pain. 84 tablet 0   No current facility-administered medications for this visit.     Allergies:   Benazepril; Adhesive [tape]; and  Sulfa antibiotics   Social History:  The patient  reports that she has never smoked. She has never used smokeless tobacco. She reports that she drinks alcohol. She reports that she does not use drugs.   Family History:  The patient's family history includes Heart disease in her mother; Stomach cancer in her paternal grandfather.    ROS:  Please see the history of present illness.   Otherwise, review of systems is positive for palpitations.   All other systems are reviewed and negative.   PHYSICAL EXAM: VS:  BP 126/84   Pulse 75   Ht 6\' 2"  (1.88 m)   Wt 278 lb 9.6 oz (126.4 kg)   BMI  35.77 kg/m  , BMI Body mass index is 35.77 kg/m. GEN: Well nourished, well developed, in no acute distress  HEENT: normal  Neck: no JVD, carotid bruits, or masses Cardiac: iRRR; no murmurs, rubs, or gallops,no edema  Respiratory:  clear to auscultation bilaterally, normal work of breathing GI: soft, nontender, nondistended, + BS MS: no deformity or atrophy  Skin: warm and dry Neuro:  Strength and sensation are intact Psych: euthymic mood, full affect  EKG:  EKG is ordered today. Personal review of the ekg ordered shows atrial fibrillation, right bundle branch block, rate 75    Recent Labs: No results found for requested labs within last 8760 hours.    Lipid Panel  No results found for: CHOL, TRIG, HDL, CHOLHDL, VLDL, LDLCALC, LDLDIRECT   Wt Readings from Last 3 Encounters:  11/28/17 278 lb 9.6 oz (126.4 kg)  07/27/17 274 lb (124.3 kg)  01/11/17 280 lb (127 kg)      Other studies Reviewed: Additional studies/ records that were reviewed today include: TTE 11/25/15 - Left ventricle: Systolic function was normal. The estimated   ejection fraction was in the range of 50% to 55%. The study is   not technically sufficient to allow evaluation of LV diastolic   function. - Left atrium: The appendage was moderately dilated. - Atrial septum: No defect or patent foramen ovale was identified.   ASSESSMENT AND PLAN:  1.  Persistent atrial fibrillation: Had ablation 12/04/2015.  She did return to atrial fibrillation and was put on flecainide which she did not tolerate due to QRS widening and was thus put on Multaq.  She has had more atrial fibrillation and thus wishes for something else.  We Tavius Turgeon plan for repeat ablation.  Risks include bleeding, tamponade, heart block, stroke, damage to surrounding organs.  She understands the risks and is agreed to the procedure.    This patients CHA2DS2-VASc Score and unadjusted Ischemic Stroke Rate (% per year) is equal to 2.2 % stroke rate/year  from a score of 2  Above score calculated as 1 point each if present [CHF, HTN, DM, Vascular=MI/PAD/Aortic Plaque, Age if 65-74, or Female] Above score calculated as 2 points each if present [Age > 75, or Stroke/TIA/TE]    2. Hypertension: Currently well controlled.  No changes.   Current medicines are reviewed at length with the patient today.   The patient does not have concerns regarding her medicines.  The following changes were made today: None  Labs/ tests ordered today include:  Orders Placed This Encounter  Procedures  . CT CARDIAC MORPH/PULM VEIN W/CM&W/O CA SCORE  . CT CORONARY FRACTIONAL FLOW RESERVE DATA PREP  . CT CORONARY FRACTIONAL FLOW RESERVE FLUID ANALYSIS  . EKG 12-Lead     Disposition:   FU with Klayton Monie 3 months  Signed, Malcomb Gangemi Meredith Leeds, MD  11/28/2017 1:46 PM     [address] [place] [address] [phone] (office) [phone] (fax)

## 2017-11-28 NOTE — H&P (View-Only) (Signed)
Electrophysiology Office Note   Date:  11/28/2017   ID:  Deborah [name], DOB 03-31-1956, MRN [id number]  PCP:  Gaynelle Arabian, MD  Cardiologist:  Irish Lack Primary Electrophysiologist:  Constance Haw, MD    Chief Complaint  Patient presents with  . Follow-up    PAF     History of Present Illness: Deborah [name] is a 62 y.o. female who presents today for electrophysiology evaluation.   She has a history of paroxysmal atrial fibrillation.  She had ablation on 12/04/15. She did well for a while, but did return back to atrial fibrillation after her flecainide was stopped. She was started on flecainide and had cardioversion on 10/01/16. When she presented for cardioversion, she was in sinus rhythm.  Her flecainide dose was reduced as her QRS had lengthened at the last visit, this was since stopped and she was started on Multaq.  Today, denies symptoms of palpitations, chest pain, shortness of breath, orthopnea, PND, lower extremity edema, claudication, dizziness, presyncope, syncope, bleeding, or neurologic sequela. The patient is tolerating medications without difficulties.  His last being seen, she has been going in and out of atrial fibrillation.  Her main symptoms are weakness, fatigue, and shortness of breath.  She does not feel like her antiarrhythmics are working as efficiently as they should be.  She is interested in repeat ablation.   Past Medical History:  Diagnosis Date  . A-fib ([place])   . Anemia    hx   . Anxiety   . Arthritis   . Dysrhythmia    atrial fib/flutter  . GERD (gastroesophageal reflux disease)    occ  . HTN (hypertension)   . Obesity   . OSA (obstructive sleep apnea)    cpap  . PAF (paroxysmal atrial fibrillation) ([place])    s/p DCCV 2010  . Peripheral vascular disease ([place]) 2016   after flying in leg   Past Surgical History:  Procedure Laterality Date  . BTL    . CARDIAC CATHETERIZATION Left 2016   cataract  . ELECTROPHYSIOLOGIC STUDY N/A 12/04/2015   Procedure: Atrial Fibrillation Ablation;  Surgeon: Will Meredith Leeds, MD;  Location: [facility];  Service: Cardiovascular;  Laterality: N/A;  . TONSILLECTOMY AND ADENOIDECTOMY    . TOTAL KNEE ARTHROPLASTY Right 04/23/2016  . TOTAL KNEE ARTHROPLASTY Right 04/23/2016   Procedure: TOTAL KNEE ARTHROPLASTY;  Surgeon: [name], MD;  Location: [facility];  Service: Orthopedics;  Laterality: Right;  . TUBAL LIGATION       Current Outpatient Medications  Medication Sig Dispense Refill  . ALPRAZolam (XANAX) 0.5 MG tablet Take 0.5 mg by mouth daily as needed for anxiety. Reported on 10/08/2015    . calcium carbonate (TUMS - DOSED IN MG ELEMENTAL CALCIUM) 500 MG chewable tablet Chew 1 tablet by mouth as directed.    Marland Kitchen ELIQUIS 5 MG TABS tablet TAKE 1 TABLET (5 MG TOTAL) BY MOUTH 2 (TWO) TIMES DAILY. 60 tablet 11  . escitalopram (LEXAPRO) 10 MG tablet Take 10 mg by mouth every evening.   3  . metoprolol succinate (TOPROL-XL) 50 MG 24 hr tablet Take 1 tablet (50 mg total) by mouth daily. Take with or immediately following a meal. 30 tablet 3  . oxyCODONE-acetaminophen (ROXICET) 5-325 MG tablet Take 1-2 tablets by mouth every 4 (four) hours as needed for moderate pain or severe pain. 84 tablet 0   No current facility-administered medications for this visit.     Allergies:   Benazepril; Adhesive [tape]; and  Sulfa antibiotics   Social History:  The patient  reports that she has never smoked. She has never used smokeless tobacco. She reports that she drinks alcohol. She reports that she does not use drugs.   Family History:  The patient's family history includes Heart disease in her mother; Stomach cancer in her paternal grandfather.    ROS:  Please see the history of present illness.   Otherwise, review of systems is positive for palpitations.   All other systems are reviewed and negative.   PHYSICAL EXAM: VS:  BP 126/84   Pulse 75   Ht 6\' 2"  (1.88 m)   Wt 278 lb 9.6 oz (126.4 kg)   BMI  35.77 kg/m  , BMI Body mass index is 35.77 kg/m. GEN: Well nourished, well developed, in no acute distress  HEENT: normal  Neck: no JVD, carotid bruits, or masses Cardiac: iRRR; no murmurs, rubs, or gallops,no edema  Respiratory:  clear to auscultation bilaterally, normal work of breathing GI: soft, nontender, nondistended, + BS MS: no deformity or atrophy  Skin: warm and dry Neuro:  Strength and sensation are intact Psych: euthymic mood, full affect  EKG:  EKG is ordered today. Personal review of the ekg ordered shows atrial fibrillation, right bundle branch block, rate 75    Recent Labs: No results found for requested labs within last 8760 hours.    Lipid Panel  No results found for: CHOL, TRIG, HDL, CHOLHDL, VLDL, LDLCALC, LDLDIRECT   Wt Readings from Last 3 Encounters:  11/28/17 278 lb 9.6 oz (126.4 kg)  07/27/17 274 lb (124.3 kg)  01/11/17 280 lb (127 kg)      Other studies Reviewed: Additional studies/ records that were reviewed today include: TTE 11/25/15 - Left ventricle: Systolic function was normal. The estimated   ejection fraction was in the range of 50% to 55%. The study is   not technically sufficient to allow evaluation of LV diastolic   function. - Left atrium: The appendage was moderately dilated. - Atrial septum: No defect or patent foramen ovale was identified.   ASSESSMENT AND PLAN:  1.  Persistent atrial fibrillation: Had ablation 12/04/2015.  She did return to atrial fibrillation and was put on flecainide which she did not tolerate due to QRS widening and was thus put on Multaq.  She has had more atrial fibrillation and thus wishes for something else.  We will plan for repeat ablation.  Risks include bleeding, tamponade, heart block, stroke, damage to surrounding organs.  She understands the risks and is agreed to the procedure.    This patients CHA2DS2-VASc Score and unadjusted Ischemic Stroke Rate (% per year) is equal to 2.2 % stroke rate/year  from a score of 2  Above score calculated as 1 point each if present [CHF, HTN, DM, Vascular=MI/PAD/Aortic Plaque, Age if 65-74, or Female] Above score calculated as 2 points each if present [Age > 75, or Stroke/TIA/TE]    2. Hypertension: Currently well controlled.  No changes.   Current medicines are reviewed at length with the patient today.   The patient does not have concerns regarding her medicines.  The following changes were made today: None  Labs/ tests ordered today include:  Orders Placed This Encounter  Procedures  . CT CARDIAC MORPH/PULM VEIN W/CM&W/O CA SCORE  . CT CORONARY FRACTIONAL FLOW RESERVE DATA PREP  . CT CORONARY FRACTIONAL FLOW RESERVE FLUID ANALYSIS  . EKG 12-Lead     Disposition:   FU with Will Camnitz 3 months  Signed, Will Meredith Leeds, MD  11/28/2017 1:46 PM     [address] [place] [address] [phone] (office) [phone] (fax)

## 2017-11-28 NOTE — Patient Instructions (Addendum)
Medication Instructions:  Your physician has recommended you make the following change in your medication:  1. STOP Multaq  Labwork: None ordered  Testing/Procedures: Your physician has recommended that you have a Cardioversion (DCCV). Electrical Cardioversion uses a jolt of electricity to your heart either through paddles or wired patches attached to your chest. This is a controlled, usually prescheduled, procedure. Defibrillation is done under light anesthesia in the hospital, and you usually go home the day of the procedure. This is done to get your heart back into a normal rhythm. You are not awake for the procedure. CARDIOVERSION INSTRUCTIONS Please arrive at the [facility] main entrance of [facility] hospital on 12/09/2017 at  12:30 pm Do not eat or drink after midnight prior to procedure Take your medications as normal the morning of your procedure with a sip of water. You will need someone to drive you home at discharge   Your physician has requested that you have cardiac CT - within 7 days PRIOR to your ablation. Cardiac computed tomography (CT) is a painless test that uses an x-ray machine to take clear, detailed pictures of your heart. For further information please visit [employer]. Please follow instruction sheet as given.  Your physician has recommended that you have an ablation. Catheter ablation is a medical procedure used to treat some cardiac arrhythmias (irregular heartbeats). During catheter ablation, a long, thin, flexible tube is put into a blood vessel in your groin (upper thigh), or neck. This tube is called an ablation catheter. It is then guided to your heart through the blood vessel. Radio frequency waves destroy small areas of heart tissue where abnormal heartbeats may cause an arrhythmia to start. Please see the instruction sheet given to you today.  Instructions for your ablation: 1. Please arrive at the [facility], Main Entrance "A", of [facility]  at 5:30 a.m. on 01/25/2018. 2. Do not eat or drink after midnight the night prior to the procedure. 3. Do not miss any doses of ELIQUIS prior to the morning of the procedure.  4. Do not take any medications the morning of the procedure. 5. You will shaved for this procedure (if needed). Typically both groins, chest and back  are shaven. We ask that you shave these areas yourself at home 1-2 days prior  to the procedure. If you are uncomfortable/unable, then hospital staff will shave  these areas the morning of your procedure. 6. Plan for an overnight stay in the hospital. 7. You will need someone to drive you home at discharge.   Follow-Up: Keep you appointment on 12/28/17 with Dr. Curt Bears.  Your physician recommends that you schedule a follow-up appointment in: 4 weeks, after your procedure on 01/25/18, with Roderic Palau in the AFib clinic.  Your physician recommends that you schedule a follow-up appointment in: 3 months, after your procedure on 01/25/18, with Dr. Curt Bears.   * If you need a refill on your cardiac medications before your next appointment, please call your pharmacy.   *Please note that any paperwork needing to be filled out by the provider will need to be addressed at the front desk prior to seeing the provider. Please note that any FMLA, disability or other documents regarding health condition is subject to a $25.00 charge that must be received prior to completion of paperwork in the form of a money order or check.  Thank you for choosing [facility]!!   Trinidad Curet, RN [phone]  Any Other Special Instructions Will Be Listed Below (  If Applicable).  CT INSTRUCTIONS Please arrive at the [facility] main entrance of [facility] at xx:xx AM (30-45 minutes prior to test start time)  [facility] [place],  [id number] [phone]  Proceed to the [facility] Radiology Department (First Floor).  Please follow these  instructions carefully (unless otherwise directed):  Hold all erectile dysfunction medications at least 48 hours prior to test.  On the Night Before the Test: . Drink plenty of water. . Do not consume any caffeinated/decaffeinated beverages or chocolate 12 hours prior to your test. . Do not take any antihistamines 12 hours prior to your test. . If you take Metformin do not take 24 hours prior to test. . If the patient has contrast allergy: ? Patient will need a prescription for Prednisone and very clear instructions (as follows): 1. Prednisone 50 mg - take 13 hours prior to test 2. Take another Prednisone 50 mg 7 hours prior to test 3. Take another Prednisone 50 mg 1 hour prior to test 4. Take Benadryl 50 mg 1 hour prior to test . Patient must complete all four doses of above prophylactic medications. . Patient will need a ride after test due to Benadryl.  On the Day of the Test: . Drink plenty of water. Do not drink any water within one hour of the test. . Do not eat any food 4 hours prior to the test. . You may take your regular medications prior to the test. . MAKE SURE TO TAKE YOUR TOPROL THE MORNING OF THIS TEST . HOLD Furosemide morning of the test.  After the Test: . Drink plenty of water. . After receiving IV contrast, you may experience a mild flushed feeling. This is normal. . On occasion, you may experience a mild rash up to 24 hours after the test. This is not dangerous. If this occurs, you can take Benadryl 25 mg and increase your fluid intake. . If you experience trouble breathing, this can be serious. If it is severe call 911 IMMEDIATELY. If it is mild, please call our office. . If you take any of these medications: Glipizide/Metformin, Avandament, Glucavance, please do not take 48 hours after completing test.

## 2017-12-01 ENCOUNTER — Ambulatory Visit: Payer: Self-pay | Admitting: Cardiology

## 2017-12-09 ENCOUNTER — Encounter (HOSPITAL_COMMUNITY)
Admission: RE | Disposition: A | Discharge status: Home / Self Care | Payer: Self-pay | Source: Ambulatory Visit | Attending: Cardiovascular Disease

## 2017-12-09 ENCOUNTER — Ambulatory Visit (HOSPITAL_COMMUNITY): Payer: 59 | Admitting: Certified Registered Nurse Anesthetist

## 2017-12-09 ENCOUNTER — Ambulatory Visit (HOSPITAL_COMMUNITY)
Admission: RE | Admit: 2017-12-09 | Discharge: 2017-12-09 | Disposition: A | Discharge status: Home / Self Care | Payer: 59 | Source: Ambulatory Visit | Attending: Cardiovascular Disease | Admitting: Cardiovascular Disease

## 2017-12-09 ENCOUNTER — Encounter (HOSPITAL_COMMUNITY): Payer: Self-pay | Admitting: *Deleted

## 2017-12-09 DIAGNOSIS — K219 Gastro-esophageal reflux disease without esophagitis: Secondary | ICD-10-CM | POA: Diagnosis not present

## 2017-12-09 DIAGNOSIS — E669 Obesity, unspecified: Secondary | ICD-10-CM | POA: Insufficient documentation

## 2017-12-09 DIAGNOSIS — Z882 Allergy status to sulfonamides status: Secondary | ICD-10-CM | POA: Insufficient documentation

## 2017-12-09 DIAGNOSIS — I4891 Unspecified atrial fibrillation: Secondary | ICD-10-CM | POA: Diagnosis not present

## 2017-12-09 DIAGNOSIS — Z6835 Body mass index (BMI) 35.0-35.9, adult: Secondary | ICD-10-CM | POA: Diagnosis not present

## 2017-12-09 DIAGNOSIS — I481 Persistent atrial fibrillation: Secondary | ICD-10-CM | POA: Insufficient documentation

## 2017-12-09 DIAGNOSIS — I4819 Other persistent atrial fibrillation: Secondary | ICD-10-CM

## 2017-12-09 DIAGNOSIS — G4733 Obstructive sleep apnea (adult) (pediatric): Secondary | ICD-10-CM | POA: Insufficient documentation

## 2017-12-09 DIAGNOSIS — Z7901 Long term (current) use of anticoagulants: Secondary | ICD-10-CM | POA: Diagnosis not present

## 2017-12-09 DIAGNOSIS — F419 Anxiety disorder, unspecified: Secondary | ICD-10-CM | POA: Diagnosis not present

## 2017-12-09 DIAGNOSIS — I10 Essential (primary) hypertension: Secondary | ICD-10-CM | POA: Insufficient documentation

## 2017-12-09 DIAGNOSIS — I739 Peripheral vascular disease, unspecified: Secondary | ICD-10-CM | POA: Diagnosis not present

## 2017-12-09 HISTORY — PX: CARDIOVERSION: SHX1299

## 2017-12-09 SURGERY — CARDIOVERSION
Anesthesia: General

## 2017-12-09 MED ORDER — SODIUM CHLORIDE 0.9 % IV SOLN
INTRAVENOUS | Status: DC | PRN
  Administered 2017-12-09: 13:00:00 via INTRAVENOUS

## 2017-12-09 MED ORDER — LIDOCAINE 2% (20 MG/ML) 5 ML SYRINGE
INTRAMUSCULAR | Status: DC | PRN
  Administered 2017-12-09: 40 mg via INTRAVENOUS

## 2017-12-09 MED ORDER — PROPOFOL 10 MG/ML IV BOLUS
INTRAVENOUS | Status: DC | PRN
  Administered 2017-12-09: 60 mg via INTRAVENOUS

## 2017-12-09 NOTE — Anesthesia Postprocedure Evaluation (Signed)
Anesthesia Post Note  Patient: Deborah [name]  Procedure(s) Performed: CARDIOVERSION (N/A )     Patient location during evaluation: Endoscopy Anesthesia Type: MAC Level of consciousness: awake and alert Pain management: pain level controlled Vital Signs Assessment: post-procedure vital signs reviewed and stable Respiratory status: spontaneous breathing, nonlabored ventilation, respiratory function stable and patient connected to nasal cannula oxygen Cardiovascular status: stable and blood pressure returned to baseline Postop Assessment: no apparent nausea or vomiting Anesthetic complications: no    Last Vitals:  Vitals:   12/09/17 1323 12/09/17 1330  BP: 102/66 113/76  Pulse: 65 64  Resp: 18 20  Temp: 36.5 C   SpO2: 99% 95%    Last Pain:  Vitals:   12/09/17 1330  TempSrc:   PainSc: 0-No pain                 [name]

## 2017-12-09 NOTE — Interval H&P Note (Signed)
History and Physical Interval Note:  12/09/2017 12:45 PM  Deborah [name]  has presented today for surgery, with the diagnosis of afib  The various methods of treatment have been discussed with the patient and family. After consideration of risks, benefits and other options for treatment, the patient has consented to  Procedure(s): CARDIOVERSION (N/A) as a surgical intervention .  The patient's history has been reviewed, patient examined, no change in status, stable for surgery.  I have reviewed the patient's chart and labs.  Questions were answered to the patient's satisfaction.     Rechy Bost

## 2017-12-09 NOTE — Transfer of Care (Signed)
Immediate Anesthesia Transfer of Care Note  Patient: Deborah [name]  Procedure(s) Performed: CARDIOVERSION (N/A )  Patient Location: Endoscopy Unit  Anesthesia Type:General  Level of Consciousness: awake, alert , oriented and patient cooperative  Airway & Oxygen Therapy: Patient Spontanous Breathing  Post-op Assessment: Report given to RN, Post -op Vital signs reviewed and stable and Patient moving all extremities X 4  Post vital signs: Reviewed and stable  Last Vitals:  Vitals Value Taken Time  BP    Temp    Pulse    Resp    SpO2      Last Pain:  Vitals:   12/09/17 1219  PainSc: 0-No pain         Complications: No apparent anesthesia complications

## 2017-12-09 NOTE — Op Note (Signed)
Procedure: Electrical Cardioversion Indications:  Atrial Fibrillation  Procedure Details:  Consent: Risks of procedure as well as the alternatives and risks of each were explained to the (patient/caregiver).  Consent for procedure obtained.  Time Out: Verified patient identification, verified procedure, site/side was marked, verified correct patient position, special equipment/implants available, medications/allergies/relevent history reviewed, required imaging and test results available.  Performed  Patient placed on cardiac monitor, pulse oximetry, supplemental oxygen as necessary.  Sedation given: 60 mg IV Propofol, Dr. Linna Caprice Pacer pads placed anterior and posterior chest.  Cardioverted 1 time(s).  Cardioversion with synchronized biphasic 120J shock.  Evaluation: Findings: Post procedure EKG shows: NSR Complications: None Patient did tolerate procedure well.  Time Spent Directly with the Patient:  30 minutes   Daniella Dewberry 12/09/2017, 1:20 PM

## 2017-12-09 NOTE — Anesthesia Preprocedure Evaluation (Addendum)
Anesthesia Evaluation  Patient identified by MRN, date of birth, ID band Patient awake    Reviewed: Allergy & Precautions, NPO status , Patient's Chart, lab work & pertinent test results  Airway Mallampati: II  TM Distance: >3 FB Neck ROM: Full    Dental  (+) Teeth Intact   Pulmonary    breath sounds clear to auscultation       Cardiovascular hypertension,  Rhythm:Irregular Rate:Normal     Neuro/Psych    GI/Hepatic   Endo/Other    Renal/GU      Musculoskeletal   Abdominal (+) + obese,   Peds  Hematology   Anesthesia Other Findings   Reproductive/Obstetrics                             Anesthesia Physical Anesthesia Plan  ASA: III  Anesthesia Plan: General   Post-op Pain Management:    Induction: Intravenous  PONV Risk Score and Plan: Propofol infusion  Airway Management Planned: Mask  Additional Equipment:   Intra-op Plan:   Post-operative Plan:   Informed Consent: I have reviewed the patients History and Physical, chart, labs and discussed the procedure including the risks, benefits and alternatives for the proposed anesthesia with the patient or authorized representative who has indicated his/her understanding and acceptance.     Plan Discussed with: CRNA and Anesthesiologist  Anesthesia Plan Comments:         Anesthesia Quick Evaluation

## 2017-12-09 NOTE — Discharge Instructions (Signed)
Electrical Cardioversion, Care After °This sheet gives you information about how to care for yourself after your procedure. Your health care provider may also give you more specific instructions. If you have problems or questions, contact your health care provider. °What can I expect after the procedure? °After the procedure, it is common to have: °· Some redness on the skin where the shocks were given. ° °Follow these instructions at home: °· Do not drive for 24 hours if you were given a medicine to help you relax (sedative). °· Take over-the-counter and prescription medicines only as told by your health care provider. °· Ask your health care provider how to check your pulse. Check it often. °· Rest for 48 hours after the procedure or as told by your health care provider. °· Avoid or limit your caffeine use as told by your health care provider. °Contact a health care provider if: °· You feel like your heart is beating too quickly or your pulse is not regular. °· You have a serious muscle cramp that does not go away. °Get help right away if: °· You have discomfort in your chest. °· You are dizzy or you feel faint. °· You have trouble breathing or you are short of breath. °· Your speech is slurred. °· You have trouble moving an arm or leg on one side of your body. °· Your fingers or toes turn cold or blue. °This information is not intended to replace advice given to you by your health care provider. Make sure you discuss any questions you have with your health care provider. °Document Released: 04/04/2013 Document Revised: 01/16/2016 Document Reviewed: 12/19/2015 °Elsevier Interactive Patient Education © 2018 [employer] Inc[employer] ° °

## 2017-12-11 ENCOUNTER — Encounter (HOSPITAL_COMMUNITY): Payer: Self-pay | Admitting: Cardiovascular Disease

## 2017-12-13 NOTE — Addendum Note (Signed)
Addendum  created 12/13/17 1613 by Roberts Gaudy, MD   Sign clinical note

## 2017-12-13 NOTE — Anesthesia Postprocedure Evaluation (Signed)
Anesthesia Post Note  Patient: Deborah [name]  Procedure(s) Performed: CARDIOVERSION (N/A )     Patient location during evaluation: Endoscopy Anesthesia Type: General Level of consciousness: awake, oriented and awake and alert Pain management: pain level controlled Vital Signs Assessment: post-procedure vital signs reviewed and stable Respiratory status: spontaneous breathing and respiratory function stable Cardiovascular status: blood pressure returned to baseline Anesthetic complications: no    Last Vitals:  Vitals:   12/09/17 1323 12/09/17 1330  BP: 102/66 113/76  Pulse: 65 64  Resp: 18 20  Temp: 36.5 C   SpO2: 99% 95%    Last Pain:  Vitals:   12/13/17 1444  TempSrc:   PainSc: 0-No pain                 Ephraim Reichel COKER

## 2017-12-14 DIAGNOSIS — Z23 Encounter for immunization: Secondary | ICD-10-CM | POA: Diagnosis not present

## 2017-12-14 DIAGNOSIS — E538 Deficiency of other specified B group vitamins: Secondary | ICD-10-CM | POA: Diagnosis not present

## 2017-12-27 NOTE — Progress Notes (Signed)
Electrophysiology Office Note   Date:  12/28/2017   ID:  Deborah [name], DOB Aug 05, 1955, MRN [id number]  PCP:  Deborah Arabian, MD  Cardiologist:  Deborah [name] Primary Electrophysiologist:  Deborah Haw, MD    Chief Complaint  Patient presents with  . Follow-up    Persistent Afib/H&P Pre procedure labs     History of Present Illness: Deborah [name] is a 62 y.o. female who presents today for electrophysiology evaluation.   She has a history of paroxysmal atrial fibrillation.  She had ablation on 12/04/15. She did well for a while, but did return back to atrial fibrillation after her flecainide was stopped. She was started on flecainide and had cardioversion on 10/01/16. When she presented for cardioversion, she was in sinus rhythm.  Her flecainide dose was reduced as her QRS had lengthened at the last visit, this was since stopped and she was started on Multaq.  She has had more atrial fibrillation and thus is planned for ablation 01/25/2018.  Today, denies symptoms of palpitations, chest pain, shortness of breath, orthopnea, PND, lower extremity edema, claudication, dizziness, presyncope, syncope, bleeding, or neurologic sequela. The patient is tolerating medications without difficulties.  She recently had a cardioversion and she remains in sinus rhythm.  Overall she has no complaints.  She feels well.   Past Medical History:  Diagnosis Date  . A-fib ([place])   . Anemia    hx   . Anxiety   . Arthritis   . Dysrhythmia    atrial fib/flutter  . GERD (gastroesophageal reflux disease)    occ  . HTN (hypertension)   . Obesity   . OSA (obstructive sleep apnea)    cpap  . PAF (paroxysmal atrial fibrillation) ([place])    s/p DCCV 2010  . Peripheral vascular disease ([place]) 2016   after flying in leg   Past Surgical History:  Procedure Laterality Date  . BTL    . CARDIAC CATHETERIZATION Left 2016   cataract  . CARDIOVERSION N/A 12/09/2017   Procedure: CARDIOVERSION;  Surgeon: Deborah Klein, MD;  Location: [facility];  Service: Cardiovascular;  Laterality: N/A;  . ELECTROPHYSIOLOGIC STUDY N/A 12/04/2015   Procedure: Atrial Fibrillation Ablation;  Surgeon: Deborah Mcelwain Meredith Leeds, MD;  Location: [facility];  Service: Cardiovascular;  Laterality: N/A;  . TONSILLECTOMY AND ADENOIDECTOMY    . TOTAL KNEE ARTHROPLASTY Right 04/23/2016  . TOTAL KNEE ARTHROPLASTY Right 04/23/2016   Procedure: TOTAL KNEE ARTHROPLASTY;  Surgeon: Deborah Server, MD;  Location: [facility];  Service: Orthopedics;  Laterality: Right;  . TUBAL LIGATION       Current Outpatient Medications  Medication Sig Dispense Refill  . acetaminophen (TYLENOL) 500 MG tablet Take 1,000 mg by mouth every 6 (six) hours as needed for moderate pain or headache.    . ALPRAZolam (XANAX) 0.5 MG tablet Take 0.5 mg by mouth 3 (three) times daily as needed for anxiety.     . calcium carbonate (TUMS - DOSED IN MG ELEMENTAL CALCIUM) 500 MG chewable tablet Chew 3 tablets by mouth daily as needed for indigestion or heartburn.     . cyanocobalamin 100 MCG tablet Take 100 mcg by mouth daily.    Deborah [name] ELIQUIS 5 MG TABS tablet TAKE 1 TABLET (5 MG TOTAL) BY MOUTH 2 (TWO) TIMES DAILY. 60 tablet 11  . escitalopram (LEXAPRO) 10 MG tablet Take 10 mg by mouth every evening.   3  . metoprolol succinate (TOPROL-XL) 50 MG 24 hr tablet Take 1 tablet (50  mg total) by mouth daily. Take with or immediately following a meal. 30 tablet 3   No current facility-administered medications for this visit.     Allergies:   Benazepril; Adhesive [tape]; and Sulfa antibiotics   Social History:  The patient  reports that she has never smoked. She has never used smokeless tobacco. She reports that she drinks alcohol. She reports that she does not use drugs.   Family History:  The patient's family history includes Heart disease in her mother; Stomach cancer in her paternal grandfather.   ROS:  Please see the history of present illness.   Otherwise, review of  systems is positive for none.   All other systems are reviewed and negative.   PHYSICAL EXAM: VS:  BP 110/80   Pulse 64   Ht 6\' 1"  (1.854 m)   Wt 277 lb 6.4 oz (125.8 kg)   BMI 36.60 kg/m  , BMI Body mass index is 36.6 kg/m. GEN: Well nourished, well developed, in no acute distress  HEENT: normal  Neck: no JVD, carotid bruits, or masses Cardiac: RRR; no murmurs, rubs, or gallops,no edema  Respiratory:  clear to auscultation bilaterally, normal work of breathing GI: soft, nontender, nondistended, + BS MS: no deformity or atrophy  Skin: warm and dry Neuro:  Strength and sensation are intact Psych: euthymic mood, full affect  EKG:  EKG is ordered today. Personal review of the ekg ordered shows this rhythm, right bundle branch block, rate 64  Recent Labs: No results found for requested labs within last 8760 hours.    Lipid Panel  No results found for: CHOL, TRIG, HDL, CHOLHDL, VLDL, LDLCALC, LDLDIRECT   Wt Readings from Last 3 Encounters:  12/28/17 277 lb 6.4 oz (125.8 kg)  11/28/17 278 lb 9.6 oz (126.4 kg)  07/27/17 274 lb (124.3 kg)      Other studies Reviewed: Additional studies/ records that were reviewed today include: TTE 11/25/15 - Left ventricle: Systolic function was normal. The estimated   ejection fraction was in the range of 50% to 55%. The study is   not technically sufficient to allow evaluation of LV diastolic   function. - Left atrium: The appendage was moderately dilated. - Atrial septum: No defect or patent foramen ovale was identified.   ASSESSMENT AND PLAN:  1.  Persistent atrial fibrillation: Had ablation 12/04/2015.  Unfortunately had more atrial fibrillation and thus his plan for repeat ablation 01/25/2018.  Risks and benefits of the procedure were discussed and include bleeding, tamponade, heart block, stroke, damage to surrounding organs.  The patient understands these risks and is agreed to the procedure.    This patients CHA2DS2-VASc Score and  unadjusted Ischemic Stroke Rate (% per year) is equal to 2.2 % stroke rate/year from a score of 2  Above score calculated as 1 point each if present [CHF, HTN, DM, Vascular=MI/PAD/Aortic Plaque, Age if 65-74, or Female] Above score calculated as 2 points each if present [Age > 75, or Stroke/TIA/TE]  2. Hypertension: Currently well controlled.  No changes.   Current medicines are reviewed at length with the patient today.   The patient does not have concerns regarding her medicines.  The following changes were made today: none  Labs/ tests ordered today include:  Orders Placed This Encounter  Procedures  . EKG 12-Lead     Disposition:   FU with Zac Torti 3 months  Signed, Serita Degroote Meredith Leeds, MD  12/28/2017 11:58 AM     [facility] [address] [place] [address] [phone] (office) [phone] (fax)

## 2017-12-28 ENCOUNTER — Encounter: Payer: Self-pay | Admitting: Cardiology

## 2017-12-28 ENCOUNTER — Encounter (INDEPENDENT_AMBULATORY_CARE_PROVIDER_SITE_OTHER): Payer: Self-pay

## 2017-12-28 ENCOUNTER — Ambulatory Visit: Payer: 59 | Admitting: Cardiology

## 2017-12-28 VITALS — BP 110/80 | HR 64 | Ht 73.0 in | Wt 277.4 lb

## 2017-12-28 DIAGNOSIS — I481 Persistent atrial fibrillation: Secondary | ICD-10-CM

## 2017-12-28 DIAGNOSIS — I10 Essential (primary) hypertension: Secondary | ICD-10-CM | POA: Diagnosis not present

## 2017-12-28 DIAGNOSIS — Z01812 Encounter for preprocedural laboratory examination: Secondary | ICD-10-CM | POA: Diagnosis not present

## 2017-12-28 DIAGNOSIS — I4819 Other persistent atrial fibrillation: Secondary | ICD-10-CM

## 2017-12-28 LAB — CBC
HEMATOCRIT: 39.4 % (ref 34.0–46.6)
Hemoglobin: 13 g/dL (ref 11.1–15.9)
MCH: 26.5 pg — ABNORMAL LOW (ref 26.6–33.0)
MCHC: 33 g/dL (ref 31.5–35.7)
MCV: 80 fL (ref 79–97)
PLATELETS: 310 10*3/uL (ref 150–450)
RBC: 4.91 x10E6/uL (ref 3.77–5.28)
RDW: 17 % — AB (ref 12.3–15.4)
WBC: 7.6 10*3/uL (ref 3.4–10.8)

## 2017-12-28 LAB — BASIC METABOLIC PANEL
BUN / CREAT RATIO: 16 (ref 12–28)
BUN: 15 mg/dL (ref 8–27)
CHLORIDE: 105 mmol/L (ref 96–106)
CO2: 25 mmol/L (ref 20–29)
CREATININE: 0.93 mg/dL (ref 0.57–1.00)
Calcium: 9.8 mg/dL (ref 8.7–10.3)
GFR calc non Af Amer: 66 mL/min/{1.73_m2} (ref >59)
GFR, EST AFRICAN AMERICAN: 76 mL/min/{1.73_m2} (ref >59)
GLUCOSE: 97 mg/dL (ref 65–99)
Potassium: 4.7 mmol/L (ref 3.5–5.2)
SODIUM: 143 mmol/L (ref 134–144)

## 2017-12-28 NOTE — Addendum Note (Signed)
Addended by: [name] on: 12/28/2017 12:03 PM   Modules accepted: Orders

## 2017-12-28 NOTE — Patient Instructions (Addendum)
Medication Instructions:  Your physician recommends that you continue on your current medications as directed. Please refer to the Current Medication list given to you today.  Labwork: Pre procedure labs today: BMET & CBC  Testing/Procedures: Your physician has requested that you have cardiac CT - within 7 days PRIOR to your ablation. Cardiac computed tomography (CT) is a painless test that uses an x-ray machine to take clear, detailed pictures of your heart. For further information please visit [employer]. Please follow instruction sheet as given.  Keep your scheduled appointment on 01/20/18 for this test  Your physician has recommended that you have an ablation. Catheter ablation is a medical procedure used to treat some cardiac arrhythmias (irregular heartbeats). During catheter ablation, a long, thin, flexible tube is put into a blood vessel in your groin (upper thigh), or neck. This tube is called an ablation catheter. It is then guided to your heart through the blood vessel. Radio frequency waves destroy small areas of heart tissue where abnormal heartbeats may cause an arrhythmia to start. Please see the instruction sheet given to you today.            Instructions for your ablation: 1. Please arrive at the [facility], Main Entrance "A", of [facility] at 5:30 a.m. on 01/25/2018. 2. Do not eat or drink after midnight the night prior to the procedure. 3. Do not miss any doses of ELIQUIS prior to the morning of the procedure.  4. Do not take any medications the morning of the procedure. 5. You will shaved for this procedure (if needed). Typically both groins, chest and back     are shaven. We ask that you shave these areas yourself at home 1-2 days prior         to the procedure. If you are uncomfortable/unable, then hospital staff will shave  these areas the morning of your procedure. 6. Plan for an overnight stay in the hospital. 7. You will need someone to drive you home  at discharge.   Follow-Up: Your physician recommends that you schedule a follow-up appointment in: 3 months, after your procedure on 01/25/18, with Dr. Curt Bears.   * If you need a refill on your cardiac medications before your next appointment, please call your pharmacy.   *Please note that any paperwork needing to be filled out by the provider will need to be addressed at the front desk prior to seeing the provider. Please note that any FMLA, disability or other documents regarding health condition is subject to a $25.00 charge that must be received prior to completion of paperwork in the form of a money order or check.  Thank you for choosing [facility]!!   Trinidad Curet, RN [phone]  Any Other Special Instructions Will Be Listed Below (If Applicable).  CT INSTRUCTIONS Please arrive at the [facility] main entrance of [facility] at xx:xx AM (30-45 minutes prior to test start time)  [facility] [place], [place] [address] [phone]  Proceed to the [facility] Radiology Department (First Floor).  Please follow these instructions carefully (unless otherwise directed):  Hold all erectile dysfunction medications at least 48 hours prior to test.  On the Night Before the Test:  Drink plenty of water.  Do not consume any caffeinated/decaffeinated beverages or chocolate 12 hours prior to your test.  Do not take any antihistamines 12 hours prior to your test.  If you take Metformin do not take 24 hours prior to test.  If the patient has contrast allergy: ? Patient will need a prescription for Prednisone and very clear instructions (as follows): 1. Prednisone 50 mg - take 13 hours prior to test 2. Take another Prednisone 50 mg 7 hours prior to test 3. Take another Prednisone 50 mg 1 hour prior to test 4. Take Benadryl 50 mg 1 hour prior to test  Patient must complete all four doses of above prophylactic  medications.  Patient will need a ride after test due to Benadryl.  On the Day of the Test:  Drink plenty of water. Do not drink any water within one hour of the test.  Do not eat any food 4 hours prior to the test.  You may take your regular medications prior to the test.  MAKE SURE TO TAKE YOUR TOPROL THE MORNING OF THIS TEST  HOLD Furosemide morning of the test.  After the Test:  Drink plenty of water.  After receiving IV contrast, you may experience a mild flushed feeling. This is normal.  On occasion, you may experience a mild rash up to 24 hours after the test. This is not dangerous. If this occurs, you can take Benadryl 25 mg and increase your fluid intake.  If you experience trouble breathing, this can be serious. If it is severe call 911 IMMEDIATELY. If it is mild, please call our office.  If you take any of these medications: Glipizide/Metformin, Avandament, Glucavance, please do not take 48 hours after completing test.

## 2018-01-13 ENCOUNTER — Ambulatory Visit: Payer: 59 | Admitting: Neurology

## 2018-01-20 ENCOUNTER — Ambulatory Visit (HOSPITAL_COMMUNITY)
Admission: RE | Admit: 2018-01-20 | Discharge: 2018-01-20 | Disposition: A | Discharge status: Home / Self Care | Payer: 59 | Source: Ambulatory Visit | Attending: Cardiology | Admitting: Cardiology

## 2018-01-20 ENCOUNTER — Ambulatory Visit (HOSPITAL_COMMUNITY): Payer: 59

## 2018-01-20 DIAGNOSIS — I517 Cardiomegaly: Secondary | ICD-10-CM | POA: Diagnosis not present

## 2018-01-20 DIAGNOSIS — I481 Persistent atrial fibrillation: Secondary | ICD-10-CM | POA: Diagnosis not present

## 2018-01-20 DIAGNOSIS — I4819 Other persistent atrial fibrillation: Secondary | ICD-10-CM

## 2018-01-20 DIAGNOSIS — I4891 Unspecified atrial fibrillation: Secondary | ICD-10-CM | POA: Diagnosis not present

## 2018-01-20 MED ORDER — IOPAMIDOL (ISOVUE-370) INJECTION 76%
INTRAVENOUS | Status: AC
  Filled 2018-01-20: qty 100

## 2018-01-20 MED ORDER — IOPAMIDOL (ISOVUE-370) INJECTION 76%
100.0000 mL | Freq: Once | INTRAVENOUS | Status: AC | PRN
  Administered 2018-01-20: 80 mL via INTRAVENOUS

## 2018-01-25 ENCOUNTER — Encounter (HOSPITAL_COMMUNITY): Payer: Self-pay | Admitting: Cardiology

## 2018-01-25 ENCOUNTER — Ambulatory Visit (HOSPITAL_COMMUNITY)
Admission: RE | Admit: 2018-01-25 | Discharge: 2018-01-26 | Disposition: A | Discharge status: Home / Self Care | Payer: 59 | Source: Ambulatory Visit | Attending: Cardiology | Admitting: Cardiology

## 2018-01-25 ENCOUNTER — Encounter (HOSPITAL_COMMUNITY)
Admission: RE | Disposition: A | Discharge status: Home / Self Care | Payer: Self-pay | Source: Ambulatory Visit | Attending: Cardiology

## 2018-01-25 ENCOUNTER — Ambulatory Visit (HOSPITAL_COMMUNITY): Payer: 59 | Admitting: Certified Registered Nurse Anesthetist

## 2018-01-25 ENCOUNTER — Other Ambulatory Visit: Payer: Self-pay

## 2018-01-25 DIAGNOSIS — I4891 Unspecified atrial fibrillation: Secondary | ICD-10-CM | POA: Diagnosis not present

## 2018-01-25 DIAGNOSIS — F419 Anxiety disorder, unspecified: Secondary | ICD-10-CM | POA: Diagnosis not present

## 2018-01-25 DIAGNOSIS — G4733 Obstructive sleep apnea (adult) (pediatric): Secondary | ICD-10-CM | POA: Diagnosis not present

## 2018-01-25 DIAGNOSIS — Z7901 Long term (current) use of anticoagulants: Secondary | ICD-10-CM | POA: Diagnosis not present

## 2018-01-25 DIAGNOSIS — Z882 Allergy status to sulfonamides status: Secondary | ICD-10-CM | POA: Diagnosis not present

## 2018-01-25 DIAGNOSIS — E669 Obesity, unspecified: Secondary | ICD-10-CM | POA: Diagnosis not present

## 2018-01-25 DIAGNOSIS — I10 Essential (primary) hypertension: Secondary | ICD-10-CM | POA: Insufficient documentation

## 2018-01-25 DIAGNOSIS — I4819 Other persistent atrial fibrillation: Secondary | ICD-10-CM | POA: Diagnosis present

## 2018-01-25 DIAGNOSIS — Z6835 Body mass index (BMI) 35.0-35.9, adult: Secondary | ICD-10-CM | POA: Insufficient documentation

## 2018-01-25 DIAGNOSIS — K219 Gastro-esophageal reflux disease without esophagitis: Secondary | ICD-10-CM | POA: Diagnosis not present

## 2018-01-25 DIAGNOSIS — I481 Persistent atrial fibrillation: Secondary | ICD-10-CM | POA: Diagnosis not present

## 2018-01-25 DIAGNOSIS — I739 Peripheral vascular disease, unspecified: Secondary | ICD-10-CM | POA: Insufficient documentation

## 2018-01-25 HISTORY — DX: Migraine, unspecified, not intractable, without status migrainosus: G43.909

## 2018-01-25 HISTORY — DX: Obstructive sleep apnea (adult) (pediatric): G47.33

## 2018-01-25 HISTORY — DX: Obstructive sleep apnea (adult) (pediatric): Z99.89

## 2018-01-25 HISTORY — PX: ATRIAL FIBRILLATION ABLATION: EP1191

## 2018-01-25 LAB — POCT ACTIVATED CLOTTING TIME
Activated Clotting Time: 241 seconds
Activated Clotting Time: 169 seconds

## 2018-01-25 SURGERY — ATRIAL FIBRILLATION ABLATION
Anesthesia: General

## 2018-01-25 MED ORDER — HEPARIN SODIUM (PORCINE) 1000 UNIT/ML IJ SOLN
INTRAMUSCULAR | Status: DC | PRN
  Administered 2018-01-25: 14000 [IU] via INTRAVENOUS
  Administered 2018-01-25: 5000 [IU] via INTRAVENOUS

## 2018-01-25 MED ORDER — BUPIVACAINE HCL (PF) 0.25 % IJ SOLN
INTRAMUSCULAR | Status: DC | PRN
  Administered 2018-01-25: 30 mL

## 2018-01-25 MED ORDER — ADENOSINE 6 MG/2ML IV SOLN
INTRAVENOUS | Status: AC
  Filled 2018-01-25: qty 2

## 2018-01-25 MED ORDER — BUPIVACAINE HCL (PF) 0.25 % IJ SOLN
INTRAMUSCULAR | Status: AC
  Filled 2018-01-25: qty 30

## 2018-01-25 MED ORDER — SUGAMMADEX SODIUM 200 MG/2ML IV SOLN
INTRAVENOUS | Status: DC | PRN
  Administered 2018-01-25: 150 mg via INTRAVENOUS

## 2018-01-25 MED ORDER — SODIUM CHLORIDE 0.9 % IV SOLN
INTRAVENOUS | Status: DC
  Administered 2018-01-25: 07:00:00 via INTRAVENOUS

## 2018-01-25 MED ORDER — ALPRAZOLAM 0.5 MG PO TABS: 0.5000 mg | ORAL_TABLET | Freq: Three times a day (TID) | ORAL | Status: DC | PRN

## 2018-01-25 MED ORDER — LIDOCAINE HCL (CARDIAC) PF 100 MG/5ML IV SOSY
PREFILLED_SYRINGE | INTRAVENOUS | Status: DC | PRN
  Administered 2018-01-25: 60 mg via INTRAVENOUS

## 2018-01-25 MED ORDER — MIDAZOLAM HCL 5 MG/5ML IJ SOLN
INTRAMUSCULAR | Status: DC | PRN
  Administered 2018-01-25: 2 mg via INTRAVENOUS

## 2018-01-25 MED ORDER — SODIUM CHLORIDE 0.9 % IV SOLN: 250.0000 mL | INTRAVENOUS | Status: DC | PRN

## 2018-01-25 MED ORDER — DEXAMETHASONE SODIUM PHOSPHATE 10 MG/ML IJ SOLN
INTRAMUSCULAR | Status: DC | PRN
  Administered 2018-01-25: 5 mg via INTRAVENOUS

## 2018-01-25 MED ORDER — ROCURONIUM BROMIDE 100 MG/10ML IV SOLN
INTRAVENOUS | Status: DC | PRN
  Administered 2018-01-25: 50 mg via INTRAVENOUS

## 2018-01-25 MED ORDER — HEPARIN (PORCINE) IN NACL 1000-0.9 UT/500ML-% IV SOLN
INTRAVENOUS | Status: AC
  Filled 2018-01-25: qty 500

## 2018-01-25 MED ORDER — METOPROLOL SUCCINATE ER 50 MG PO TB24
50.0000 mg | ORAL_TABLET | Freq: Every day | ORAL | Status: DC
  Administered 2018-01-25 – 2018-01-26 (×2): 50 mg via ORAL
  Filled 2018-01-25 (×2): qty 1

## 2018-01-25 MED ORDER — ESCITALOPRAM OXALATE 10 MG PO TABS
10.0000 mg | ORAL_TABLET | Freq: Every evening | ORAL | Status: DC
  Administered 2018-01-25: 18:00:00 10 mg via ORAL
  Filled 2018-01-25: qty 1

## 2018-01-25 MED ORDER — ADENOSINE 6 MG/2ML IV SOLN
INTRAVENOUS | Status: DC | PRN
  Administered 2018-01-25 (×2): 12 mg via INTRAVENOUS

## 2018-01-25 MED ORDER — ONDANSETRON HCL 4 MG/2ML IJ SOLN
INTRAMUSCULAR | Status: DC | PRN
  Administered 2018-01-25: 4 mg via INTRAVENOUS

## 2018-01-25 MED ORDER — SODIUM CHLORIDE 0.9% FLUSH
3.0000 mL | Freq: Two times a day (BID) | INTRAVENOUS | Status: DC
  Administered 2018-01-25 – 2018-01-26 (×3): 3 mL via INTRAVENOUS

## 2018-01-25 MED ORDER — FENTANYL CITRATE (PF) 100 MCG/2ML IJ SOLN
INTRAMUSCULAR | Status: DC | PRN
  Administered 2018-01-25: 50 ug via INTRAVENOUS
  Administered 2018-01-25: 100 ug via INTRAVENOUS

## 2018-01-25 MED ORDER — ACETAMINOPHEN 325 MG PO TABS: 650.0000 mg | ORAL_TABLET | ORAL | Status: DC | PRN

## 2018-01-25 MED ORDER — SODIUM CHLORIDE 0.9% FLUSH: 3.0000 mL | INTRAVENOUS | Status: DC | PRN

## 2018-01-25 MED ORDER — PROPOFOL 10 MG/ML IV BOLUS
INTRAVENOUS | Status: DC | PRN
  Administered 2018-01-25: 150 mg via INTRAVENOUS

## 2018-01-25 MED ORDER — HEPARIN (PORCINE) IN NACL 1000-0.9 UT/500ML-% IV SOLN
INTRAVENOUS | Status: DC | PRN
  Administered 2018-01-25 (×5): 500 mL

## 2018-01-25 MED ORDER — OFF THE BEAT BOOK
Freq: Once | Status: AC
  Administered 2018-01-25: 1
  Filled 2018-01-25: qty 1

## 2018-01-25 MED ORDER — VITAMIN B-12 1000 MCG PO TABS
1000.0000 ug | ORAL_TABLET | Freq: Every day | ORAL | Status: DC
  Administered 2018-01-25 – 2018-01-26 (×2): 1000 ug via ORAL
  Filled 2018-01-25 (×2): qty 1

## 2018-01-25 MED ORDER — ADENOSINE 6 MG/2ML IV SOLN
INTRAVENOUS | Status: AC
  Filled 2018-01-25: qty 6

## 2018-01-25 MED ORDER — HEPARIN SODIUM (PORCINE) 1000 UNIT/ML IJ SOLN
INTRAMUSCULAR | Status: DC | PRN
  Administered 2018-01-25: 1000 [IU] via INTRAVENOUS

## 2018-01-25 MED ORDER — HEPARIN SODIUM (PORCINE) 1000 UNIT/ML IJ SOLN
INTRAMUSCULAR | Status: AC
  Filled 2018-01-25: qty 1

## 2018-01-25 MED ORDER — CALCIUM CARBONATE ANTACID 500 MG PO CHEW
3.0000 | CHEWABLE_TABLET | Freq: Every day | ORAL | Status: DC | PRN
  Filled 2018-01-25: qty 3

## 2018-01-25 MED ORDER — APIXABAN 5 MG PO TABS
5.0000 mg | ORAL_TABLET | Freq: Two times a day (BID) | ORAL | Status: DC
  Administered 2018-01-25 – 2018-01-26 (×2): 5 mg via ORAL
  Filled 2018-01-25 (×2): qty 1

## 2018-01-25 MED ORDER — PHENYLEPHRINE HCL 10 MG/ML IJ SOLN
INTRAMUSCULAR | Status: DC | PRN
  Administered 2018-01-25: 20 ug/min via INTRAVENOUS

## 2018-01-25 MED ORDER — DOBUTAMINE IN D5W 4-5 MG/ML-% IV SOLN
INTRAVENOUS | Status: AC
  Filled 2018-01-25: qty 250

## 2018-01-25 MED ORDER — ONDANSETRON HCL 4 MG/2ML IJ SOLN: 4.0000 mg | Freq: Four times a day (QID) | INTRAMUSCULAR | Status: DC | PRN

## 2018-01-25 MED ORDER — DOBUTAMINE IN D5W 4-5 MG/ML-% IV SOLN
INTRAVENOUS | Status: DC | PRN
  Administered 2018-01-25: 20 ug/kg/min via INTRAVENOUS

## 2018-01-25 MED ORDER — ACETAMINOPHEN 500 MG PO TABS: 1000.0000 mg | ORAL_TABLET | Freq: Four times a day (QID) | ORAL | Status: DC | PRN

## 2018-01-25 MED ORDER — PROTAMINE SULFATE 10 MG/ML IV SOLN
INTRAVENOUS | Status: DC | PRN
  Administered 2018-01-25: 40 mg via INTRAVENOUS

## 2018-01-25 SURGICAL SUPPLY — 17 items
CATH MAPPNG PENTARAY F 2-6-2MM (CATHETERS) ×1 IMPLANT
CATH SMTCH THERMOCOOL SF DF (CATHETERS) ×2 IMPLANT
CATH SOUNDSTAR 3D IMAGING (CATHETERS) ×2 IMPLANT
CATH WEBSTER BI DIR CS D-F CRV (CATHETERS) ×2 IMPLANT
PACK EP LATEX FREE (CUSTOM PROCEDURE TRAY) ×1
PACK EP LF (CUSTOM PROCEDURE TRAY) ×1 IMPLANT
PAD DEFIB LIFELINK (PAD) ×2 IMPLANT
PATCH CARTO3 (PAD) ×2 IMPLANT
PENTARAY F 2-6-2MM (CATHETERS) ×2
SHEATH AVANTI 11F 11CM (SHEATH) ×2 IMPLANT
SHEATH BAYLIS SUREFLEX  M 8.5 (SHEATH) ×2
SHEATH BAYLIS SUREFLEX M 8.5 (SHEATH) ×2 IMPLANT
SHEATH BAYLIS TRANSSEPTAL 98CM (NEEDLE) ×2 IMPLANT
SHEATH PINNACLE 7F 10CM (SHEATH) ×2 IMPLANT
SHEATH PINNACLE 8F 10CM (SHEATH) ×4 IMPLANT
SHEATH PINNACLE 9F 10CM (SHEATH) ×4 IMPLANT
TUBING SMART ABLATE COOLFLOW (TUBING) ×2 IMPLANT

## 2018-01-25 NOTE — Discharge Instructions (Signed)
Post procedure care instructions No driving for 4 days. No lifting over 5 lbs for 1 week. No vigorous or sexual activity for 1 week. You may return to work on 02/01/18. Keep procedure site clean & dry. If you notice increased pain, swelling, bleeding or pus, call/return!  You may shower, but no soaking baths/hot tubs/pools for 1 week.      You have an appointment set up with the [facility].  Multiple studies have shown that being followed by a dedicated atrial fibrillation clinic in addition to the standard care you receive from your other physicians improves health. We believe that enrollment in the atrial fibrillation clinic will allow [place] to better care for you.   The phone number to the [facility] is [phone]. The clinic is staffed Monday through Friday from 8:30am to 5pm.  Parking Directions: The clinic is located in the Heart and Vascular Building connected to [facility]. 1)From [address] turn on to [address] and go to the 3rd entrance  (Heart and Vascular entrance) on the right. 2)Look to the right for Heart &Vascular Parking Garage. 3)A code for the entrance is required please call the clinic to receive this.   4)Take the elevators to the 1st floor. Registration is in the room with the glass walls at the end of the hallway.  If you have any trouble parking or locating the clinic, please dont hesitate to call [phone].

## 2018-01-25 NOTE — H&P (Signed)
[name] has presented today for surgery, with the diagnosis of atrial fibrillation.  The various methods of treatment have been discussed with the patient and family. After consideration of risks, benefits and other options for treatment, the patient has consented to  Procedure(s): Catheter ablation as a surgical intervention .  Risks include but not limited to bleeding, tamponade, heart block, stroke, damage to surrounding organs, among others. The patient's history has been reviewed, patient examined, no change in status, stable for surgery.  I have reviewed the patient's chart and labs.  Questions were answered to the patient's satisfaction.    Deborah Brisby Curt Bears, MD 01/25/2018 7:16 AM

## 2018-01-25 NOTE — Anesthesia Procedure Notes (Signed)
Procedure Name: Intubation Date/Time: 01/25/2018 8:42 AM Performed by: Izora Gala, CRNA Pre-anesthesia Checklist: Patient identified, Emergency Drugs available, Patient being monitored and Suction available Patient Re-evaluated:Patient Re-evaluated prior to induction Oxygen Delivery Method: Circle system utilized Preoxygenation: Pre-oxygenation with 100% oxygen Induction Type: IV induction Ventilation: Mask ventilation without difficulty Laryngoscope Size: Miller and 3 Grade View: Grade II Tube type: Oral Tube size: 7.5 mm Number of attempts: 1 Airway Equipment and Method: Stylet Placement Confirmation: ETT inserted through vocal cords under direct vision,  positive ETCO2 and breath sounds checked- equal and bilateral Secured at: 23 cm Dental Injury: Teeth and Oropharynx as per pre-operative assessment

## 2018-01-25 NOTE — Anesthesia Preprocedure Evaluation (Signed)
Anesthesia Evaluation  Patient identified by MRN, date of birth, ID band Patient awake    Reviewed: Allergy & Precautions, H&P , NPO status , Patient's Chart, lab work & pertinent test results  Airway Mallampati: II   Neck ROM: full    Dental   Pulmonary sleep apnea ,    breath sounds clear to auscultation       Cardiovascular hypertension, + Peripheral Vascular Disease  + dysrhythmias Atrial Fibrillation  Rhythm:irregular Rate:Normal     Neuro/Psych PSYCHIATRIC DISORDERS Anxiety    GI/Hepatic GERD  ,  Endo/Other  obese  Renal/GU      Musculoskeletal  (+) Arthritis ,   Abdominal   Peds  Hematology   Anesthesia Other Findings   Reproductive/Obstetrics                             Anesthesia Physical Anesthesia Plan  ASA: III  Anesthesia Plan: MAC   Post-op Pain Management:    Induction:   PONV Risk Score and Plan: 2 and Ondansetron, Dexamethasone, Midazolam, Treatment may vary due to age or medical condition and Propofol infusion  Airway Management Planned: Simple Face Mask  Additional Equipment:   Intra-op Plan:   Post-operative Plan:   Informed Consent: I have reviewed the patients History and Physical, chart, labs and discussed the procedure including the risks, benefits and alternatives for the proposed anesthesia with the patient or authorized representative who has indicated his/her understanding and acceptance.     Plan Discussed with: CRNA, Anesthesiologist and Surgeon  Anesthesia Plan Comments:         Anesthesia Quick Evaluation

## 2018-01-25 NOTE — Progress Notes (Signed)
Site area: 2 left fv sheaths Site Prior to Removal:  Level 0 Pressure Applied For: 20 minutes Manual:   yes Patient Status During Pull:  stable Post Pull Site:  Level 0 Post Pull Instructions Given:  yes Post Pull Pulses Present: left dp palpable Dressing Applied:  Gauze and tegaderm Bedrest begins @ 1150 Comments: IV saline locked

## 2018-01-25 NOTE — Progress Notes (Signed)
Site area: rt groin 2 fv sheaths Site Prior to Removal:  Level 0 Pressure Applied For: 20 minutes Manual:   yes Patient Status During Pull:  stable Post Pull Site:  Level 0 Post Pull Instructions Given:  yes Post Pull Pulses Present: rt dp palpable Dressing Applied:  Gauze and tegaderm Bedrest begins @  Comments:

## 2018-01-25 NOTE — Transfer of Care (Signed)
Immediate Anesthesia Transfer of Care Note  Patient: Deborah [name]  Procedure(s) Performed: ATRIAL FIBRILLATION ABLATION (N/A )  Patient Location: PACU  Anesthesia Type:General  Level of Consciousness: awake, alert , oriented and patient cooperative  Airway & Oxygen Therapy: Patient Spontanous Breathing and Patient connected to nasal cannula oxygen  Post-op Assessment: Report given to RN, Post -op Vital signs reviewed and stable, Patient moving all extremities and Patient moving all extremities X 4  Post vital signs: Reviewed and stable  Last Vitals:  Vitals Value Taken Time  BP 128/61 01/25/2018 10:50 AM  Temp 36.3 C 01/25/2018 10:50 AM  Pulse 65 01/25/2018 10:53 AM  Resp 12 01/25/2018 10:53 AM  SpO2 100 % 01/25/2018 10:53 AM  Vitals shown include unvalidated device data.  Last Pain:  Vitals:   01/25/18 1050  TempSrc: Temporal  PainSc: 0-No pain         Complications: No apparent anesthesia complications

## 2018-01-25 NOTE — Anesthesia Postprocedure Evaluation (Signed)
Anesthesia Post Note  Patient: PRANAVI AURE  Procedure(s) Performed: ATRIAL FIBRILLATION ABLATION (N/A )     Patient location during evaluation: PACU Anesthesia Type: General Level of consciousness: awake and alert Pain management: pain level controlled Vital Signs Assessment: post-procedure vital signs reviewed and stable Respiratory status: spontaneous breathing, nonlabored ventilation, respiratory function stable and patient connected to nasal cannula oxygen Cardiovascular status: blood pressure returned to baseline and stable Postop Assessment: no apparent nausea or vomiting Anesthetic complications: no    Last Vitals:  Vitals:   01/25/18 1211 01/25/18 1611  BP: 126/75 132/63  Pulse: 66 71  Resp: 18 16  Temp: 36.8 C 37 C  SpO2: 96% 95%    Last Pain:  Vitals:   01/25/18 1611  TempSrc: Oral  PainSc:                  [place] S

## 2018-01-26 DIAGNOSIS — I481 Persistent atrial fibrillation: Secondary | ICD-10-CM | POA: Diagnosis not present

## 2018-01-26 DIAGNOSIS — I10 Essential (primary) hypertension: Secondary | ICD-10-CM | POA: Diagnosis not present

## 2018-01-26 DIAGNOSIS — E669 Obesity, unspecified: Secondary | ICD-10-CM | POA: Diagnosis not present

## 2018-01-26 DIAGNOSIS — Z6835 Body mass index (BMI) 35.0-35.9, adult: Secondary | ICD-10-CM | POA: Diagnosis not present

## 2018-01-26 NOTE — Discharge Summary (Addendum)
ELECTROPHYSIOLOGY PROCEDURE DISCHARGE SUMMARY    Patient ID: Deborah [name],  MRN: [id number], DOB/AGE: 63-Jul-1957 63 y.o.  Admit date: 01/25/2018 Discharge date: 01/26/2018  Primary Care Physician: Gaynelle Arabian, MD  Primary Cardiologist: Dr. Irish Lack Electrophysiologist: Dr. Curt Bears  Primary Discharge Diagnosis:  1. Paroxysmal AFib     CHA2DS2Vasc is 2, on Eliquis  Secondary Discharge Diagnosis:  1. HTN 2. Obesity 3. OSA w/CPAP  Procedures This Admission:  1.  Electrophysiology study and radiofrequency catheter ablation on 01/25/18 by Dr Curt Bears This study demonstrated  CONCLUSIONS: 1. Sinus rhythm upon presentation.   2. Successful electrical isolation and anatomical encircling of all four pulmonary veins with radiofrequency current. 3. No inducible arrhythmias following ablation both on and off of Isuprel 4. No early apparent complications.   Brief HPI: Deborah [name] is a 62 y.o. female with a history of paroxysmal atrial fibrillation (withprior ablation) and HTN.  She has failed medical therapy with flecainide and multaq. Risks, benefits, and alternatives to catheter ablation of atrial fibrillation were reviewed with the patient who wished to proceed.  The patient underwent cardiac CT prior to the procedure which demonstrated no LAA thrombus.    Hospital Course:  The patient was admitted and underwent EPS/RFCA of atrial fibrillation with details as outlined above.  They were monitored on telemetry overnight which demonstrated SR. B/l groins are without complication on the day of discharge.  The patient feels well, no CP or SOB, she was examined by Dr. Curt Bears and considered to be stable for discharge.  Wound care and restrictions were reviewed with the patient.  The patient Deborah [name] be seen in the AFib clinic in 4 weeks and Dr Curt Bears in 12 weeks for post ablation follow up.      Physical Exam: Vitals:   01/25/18 2000 01/26/18 0628 01/26/18 0757 01/26/18 0800  BP:  (!)  131/48 134/72 134/72  Pulse:  65 60   Resp: 14 15  (!) 26  Temp:  97.8 F (36.6 C) 97.8 F (36.6 C)   TempSrc:  Oral Oral   SpO2:  94% 96%   Weight:  271 lb 2.7 oz (123 kg)    Height:        GEN- The patient is well appearing, alert and oriented x 3 today.   HEENT: normocephalic, atraumatic; sclera clear, conjunctiva pink; hearing intact; oropharynx clear; neck supple  Lungs- CTA b/l, normal work of breathing.  No wheezes, rales, rhonchi Heart- RRR,  no murmurs, rubs or gallops  GI- soft, non-tender, non-distended  Extremities- no clubbing, cyanosis, or edema; DP/PT 2+ bilaterally, b/l groins are without hematoma/bruit MS- no significant deformity or atrophy Skin- warm and dry, no rash or lesion Psych- euthymic mood, full affect Neuro- strength and sensation are intact   Labs:   Lab Results  Component Value Date   WBC 7.6 12/28/2017   HGB 13.0 12/28/2017   HCT 39.4 12/28/2017   MCV 80 12/28/2017   PLT 310 12/28/2017   No results for input(s): NA, K, CL, CO2, BUN, CREATININE, CALCIUM, PROT, BILITOT, ALKPHOS, ALT, AST, GLUCOSE in the last 168 hours.  Invalid input(s): LABALBU   Discharge Medications:  Allergies as of 01/26/2018      Reactions   Benazepril Cough   Adhesive [tape] Rash, Other (See Comments)   bandaides   Sulfa Antibiotics Rash      Medication List    TAKE these medications   acetaminophen 500 MG tablet Commonly known as:  TYLENOL Take 1,000 mg  by mouth every 6 (six) hours as needed for moderate pain or headache.   ALPRAZolam 0.5 MG tablet Commonly known as:  XANAX Take 0.5 mg by mouth 3 (three) times daily as needed for anxiety.   calcium carbonate 500 MG chewable tablet Commonly known as:  TUMS - dosed in mg elemental calcium Chew 3 tablets by mouth daily as needed for indigestion or heartburn.   cyanocobalamin 1000 MCG tablet Take 1,000 mcg by mouth daily.   ELIQUIS 5 MG Tabs tablet Generic drug:  apixaban TAKE 1 TABLET (5 MG TOTAL) BY  MOUTH 2 (TWO) TIMES DAILY.   escitalopram 10 MG tablet Commonly known as:  LEXAPRO Take 10 mg by mouth every evening.   metoprolol succinate 50 MG 24 hr tablet Commonly known as:  TOPROL-XL Take 1 tablet (50 mg total) by mouth daily. Take with or immediately following a meal.       Disposition:  Home  Discharge Instructions    Diet - low sodium heart healthy   Complete by:  As directed    Increase activity slowly   Complete by:  As directed      Follow-up Information    [facility] Follow up on 02/20/2018.   Specialty:  Cardiology Why:  3:30PM Contact information: [address] [id number] [facility]c [address] [place] [address] [phone]       Constance Haw, MD Follow up on 05/02/2018.   Specialty:  Cardiology Why:  2:30PM Contact information: [place] [address] [phone]           Duration of Discharge Encounter: Greater than 30 minutes including physician time.  SignedTommye Standard, PA-C 01/26/2018 9:01 AM   I have seen and examined this patient with Tommye Standard.  Agree with above, note added to reflect my findings.  On exam, RRR, no murmurs, lungs clear.  Patient had AF ablation for persistent atrial fibrillation. Tolerated the procedure well without complaint this morning. Plan for discharge today with follow up in EP clinic.  Bynum Mccullars M. Islam Eichinger MD 01/26/2018 11:32 AM

## 2018-01-26 NOTE — Progress Notes (Signed)
Patient discharged per orders. No prescriptions. Patient's husband at the bedside for d/c teaching. Follow up appointments, femoral site care, home instructions discussed. When to call the doctor discussed. Time allowed for questions and concerns. Patient and husband verbalized understanding of all instructions. Patient left the unit via wheelchair accompanied by tech. Roselyn Reef Latajah Thuman,RN

## 2018-02-03 DIAGNOSIS — E538 Deficiency of other specified B group vitamins: Secondary | ICD-10-CM | POA: Diagnosis not present

## 2018-02-03 DIAGNOSIS — I10 Essential (primary) hypertension: Secondary | ICD-10-CM | POA: Diagnosis not present

## 2018-02-03 DIAGNOSIS — Z13220 Encounter for screening for lipoid disorders: Secondary | ICD-10-CM | POA: Diagnosis not present

## 2018-02-03 DIAGNOSIS — I480 Paroxysmal atrial fibrillation: Secondary | ICD-10-CM | POA: Diagnosis not present

## 2018-02-08 DIAGNOSIS — L03032 Cellulitis of left toe: Secondary | ICD-10-CM | POA: Diagnosis not present

## 2018-02-08 DIAGNOSIS — M2042 Other hammer toe(s) (acquired), left foot: Secondary | ICD-10-CM | POA: Diagnosis not present

## 2018-02-08 DIAGNOSIS — M79674 Pain in right toe(s): Secondary | ICD-10-CM | POA: Diagnosis not present

## 2018-02-08 DIAGNOSIS — M79675 Pain in left toe(s): Secondary | ICD-10-CM | POA: Diagnosis not present

## 2018-02-16 ENCOUNTER — Other Ambulatory Visit: Payer: Self-pay | Admitting: Cardiology

## 2018-02-20 ENCOUNTER — Ambulatory Visit (HOSPITAL_COMMUNITY)
Admission: RE | Admit: 2018-02-20 | Discharge: 2018-02-20 | Disposition: A | Discharge status: Home / Self Care | Payer: 59 | Source: Ambulatory Visit | Attending: Nurse Practitioner | Admitting: Nurse Practitioner

## 2018-02-20 VITALS — BP 118/76 | HR 73 | Ht 73.0 in | Wt 276.0 lb

## 2018-02-20 DIAGNOSIS — I10 Essential (primary) hypertension: Secondary | ICD-10-CM | POA: Insufficient documentation

## 2018-02-20 DIAGNOSIS — F419 Anxiety disorder, unspecified: Secondary | ICD-10-CM | POA: Diagnosis not present

## 2018-02-20 DIAGNOSIS — M1711 Unilateral primary osteoarthritis, right knee: Secondary | ICD-10-CM | POA: Diagnosis not present

## 2018-02-20 DIAGNOSIS — Z888 Allergy status to other drugs, medicaments and biological substances status: Secondary | ICD-10-CM | POA: Insufficient documentation

## 2018-02-20 DIAGNOSIS — Z800 Family history of malignant neoplasm of digestive organs: Secondary | ICD-10-CM | POA: Diagnosis not present

## 2018-02-20 DIAGNOSIS — Z882 Allergy status to sulfonamides status: Secondary | ICD-10-CM | POA: Insufficient documentation

## 2018-02-20 DIAGNOSIS — G43909 Migraine, unspecified, not intractable, without status migrainosus: Secondary | ICD-10-CM | POA: Diagnosis not present

## 2018-02-20 DIAGNOSIS — G4733 Obstructive sleep apnea (adult) (pediatric): Secondary | ICD-10-CM | POA: Diagnosis not present

## 2018-02-20 DIAGNOSIS — I4510 Unspecified right bundle-branch block: Secondary | ICD-10-CM | POA: Diagnosis not present

## 2018-02-20 DIAGNOSIS — Z8249 Family history of ischemic heart disease and other diseases of the circulatory system: Secondary | ICD-10-CM | POA: Insufficient documentation

## 2018-02-20 DIAGNOSIS — I739 Peripheral vascular disease, unspecified: Secondary | ICD-10-CM | POA: Diagnosis not present

## 2018-02-20 DIAGNOSIS — Z9889 Other specified postprocedural states: Secondary | ICD-10-CM | POA: Insufficient documentation

## 2018-02-20 DIAGNOSIS — Z7901 Long term (current) use of anticoagulants: Secondary | ICD-10-CM | POA: Diagnosis not present

## 2018-02-20 DIAGNOSIS — Z96651 Presence of right artificial knee joint: Secondary | ICD-10-CM | POA: Diagnosis not present

## 2018-02-20 DIAGNOSIS — I481 Persistent atrial fibrillation: Secondary | ICD-10-CM | POA: Diagnosis not present

## 2018-02-20 DIAGNOSIS — K219 Gastro-esophageal reflux disease without esophagitis: Secondary | ICD-10-CM | POA: Diagnosis not present

## 2018-02-20 DIAGNOSIS — R9431 Abnormal electrocardiogram [ECG] [EKG]: Secondary | ICD-10-CM | POA: Insufficient documentation

## 2018-02-20 DIAGNOSIS — I480 Paroxysmal atrial fibrillation: Secondary | ICD-10-CM | POA: Diagnosis not present

## 2018-02-20 DIAGNOSIS — I4819 Other persistent atrial fibrillation: Secondary | ICD-10-CM

## 2018-02-20 DIAGNOSIS — Z79899 Other long term (current) drug therapy: Secondary | ICD-10-CM | POA: Diagnosis not present

## 2018-02-20 DIAGNOSIS — Z6836 Body mass index (BMI) 36.0-36.9, adult: Secondary | ICD-10-CM | POA: Diagnosis not present

## 2018-02-20 DIAGNOSIS — E669 Obesity, unspecified: Secondary | ICD-10-CM | POA: Insufficient documentation

## 2018-02-20 NOTE — Progress Notes (Signed)
Primary Care Physician: Gaynelle Arabian, MD Referring Physician: Dr. Gloris Ham Deborah [name] is a 62 y.o. female with a h/o persistent afib that is in the afib clinic for evaluation for one month f/u of ablation. She reports that she has not noted any afib since the procedure. No swallowing or groin issues.  Today, she denies symptoms of palpitations, chest pain, shortness of breath, orthopnea, PND, lower extremity edema, dizziness, presyncope, syncope, or neurologic sequela. The patient is tolerating medications without difficulties and is otherwise without complaint today.   Past Medical History:  Diagnosis Date  . A-fib ([place])   . Anemia    hx   . Anxiety   . Arthritis    "was in right knee" (01/25/2018)  . Dysrhythmia    atrial fib/flutter  . GERD (gastroesophageal reflux disease)    occ  . HTN (hypertension)   . Migraine    "used to have them more frequently before menopause; now only have a couple/yr" (01/25/2018)  . Obesity   . OSA on CPAP   . PAF (paroxysmal atrial fibrillation) ([place])    s/p DCCV 2010  . Peripheral vascular disease ([place]) 2016   after flying in leg   Past Surgical History:  Procedure Laterality Date  . ATRIAL FIBRILLATION ABLATION N/A 01/25/2018   Procedure: ATRIAL FIBRILLATION ABLATION;  Surgeon: Constance Haw, MD;  Location: [place] [facility];  Service: Cardiovascular;  Laterality: N/A;  . CARDIAC CATHETERIZATION  2005  . CARDIOVERSION N/A 12/09/2017   Procedure: CARDIOVERSION;  Surgeon: Sanda Klein, MD;  Location: [place];  Service: Cardiovascular;  Laterality: N/A;  . CATARACT EXTRACTION W/ INTRAOCULAR LENS IMPLANT Left 2016  . ELECTROPHYSIOLOGIC STUDY N/A 12/04/2015   Procedure: Atrial Fibrillation Ablation;  Surgeon: Will Meredith Leeds, MD;  Location: [place] [facility];  Service: Cardiovascular;  Laterality: N/A;  . JOINT REPLACEMENT    . TONSILLECTOMY AND ADENOIDECTOMY    . TOTAL KNEE ARTHROPLASTY Right 04/23/2016   Procedure:  TOTAL KNEE ARTHROPLASTY;  Surgeon: [name], MD;  Location: [place];  Service: Orthopedics;  Laterality: Right;  . TUBAL LIGATION      Current Outpatient Medications  Medication Sig Dispense Refill  . acetaminophen (TYLENOL) 500 MG tablet Take 1,000 mg by mouth every 6 (six) hours as needed for moderate pain or headache.    . ALPRAZolam (XANAX) 0.5 MG tablet Take 0.5 mg by mouth 3 (three) times daily as needed for anxiety.     . calcium carbonate (TUMS - DOSED IN MG ELEMENTAL CALCIUM) 500 MG chewable tablet Chew 3 tablets by mouth daily as needed for indigestion or heartburn.     . cyanocobalamin 1000 MCG tablet Take 1,000 mcg by mouth daily.     Marland Kitchen ELIQUIS 5 MG TABS tablet TAKE 1 TABLET (5 MG TOTAL) BY MOUTH 2 (TWO) TIMES DAILY. 60 tablet 11  . escitalopram (LEXAPRO) 10 MG tablet Take 10 mg by mouth every evening.   3  . metoprolol succinate (TOPROL-XL) 50 MG 24 hr tablet TAKE 1 TABLET BY MOUTH DAILY. TAKE WITH OR IMMEDIATELY FOLLOWING A MEAL. 90 tablet 2   No current facility-administered medications for this encounter.     Allergies  Allergen Reactions  . Benazepril Cough  . Adhesive [Tape] Rash and Other (See Comments)    bandaides  . Sulfa Antibiotics Rash    Social History   Socioeconomic History  . Marital status: Married    Spouse name: Not on file  . Number of  children: Not on file  . Years of education: Not on file  . Highest education level: Not on file  Occupational History  . Not on file  Social Needs  . Financial resource strain: Not on file  . Food insecurity:    Worry: Not on file    Inability: Not on file  . Transportation needs:    Medical: Not on file    Non-medical: Not on file  Tobacco Use  . Smoking status: Never Smoker  . Smokeless tobacco: Never Used  Substance and Sexual Activity  . Alcohol use: Yes    Alcohol/week: 0.0 standard drinks    Comment: 01/25/2018 "couple drinks/month; if that"  . Drug use: Never  . Sexual activity: Yes    Lifestyle  . Physical activity:    Days per week: Not on file    Minutes per session: Not on file  . Stress: Not on file  Relationships  . Social connections:    Talks on phone: Not on file    Gets together: Not on file    Attends religious service: Not on file    Active member of club or organization: Not on file    Attends meetings of clubs or organizations: Not on file    Relationship status: Not on file  . Intimate partner violence:    Fear of current or ex partner: Not on file    Emotionally abused: Not on file    Physically abused: Not on file    Forced sexual activity: Not on file  Other Topics Concern  . Not on file  Social History Narrative  . Not on file    Family History  Problem Relation Age of Onset  . Heart disease Mother   . Stomach cancer Paternal Grandfather   . Colon cancer Neg Hx     ROS- All systems are reviewed and negative except as per the HPI above  Physical Exam: Vitals:   02/20/18 1518  BP: 118/76  Pulse: 73  Weight: 125.2 kg  Height: 6\' 1"  (1.854 m)   Wt Readings from Last 3 Encounters:  02/20/18 125.2 kg  01/26/18 123 kg  12/28/17 125.8 kg    Labs: Lab Results  Component Value Date   NA 143 12/28/2017   K 4.7 12/28/2017   CL 105 12/28/2017   CO2 25 12/28/2017   GLUCOSE 97 12/28/2017   BUN 15 12/28/2017   CREATININE 0.93 12/28/2017   CALCIUM 9.8 12/28/2017   Lab Results  Component Value Date   INR WILL FOLLOW 09/23/2016   INR 1.0 09/23/2016   No results found for: CHOL, HDL, LDLCALC, TRIG   GEN- The patient is well appearing, alert and oriented x 3 today.   Head- normocephalic, atraumatic Eyes-  Sclera clear, conjunctiva pink Ears- hearing intact Oropharynx- clear Neck- supple, no JVP Lymph- no cervical lymphadenopathy Lungs- Clear to ausculation bilaterally, normal work of breathing Heart- Regular rate and rhythm, no murmurs, rubs or gallops, PMI not laterally displaced GI- soft, NT, ND, + BS Extremities- no  clubbing, cyanosis, or edema MS- no significant deformity or atrophy Skin- no rash or lesion Psych- euthymic mood, full affect Neuro- strength and sensation are intact  EKG-NSR with RBBB, pr int 166 bpm, qrs int 154 ms, qtc 495 ms Epic records reviewed    Assessment and Plan: 1. Persistent afib NSR since procedure Continue metoprolol succinate  50 mg daily   2. Chadsvasc score of 2 Continue eliquis 5 mg bid Reminded not to  interrupt anticoaguatlion  F/u with Dr. Curt Bears 11/5  Geroge Baseman. Azalyn Sliwa, [facility] [address] [place], [place] [address] [phone]

## 2018-02-22 DIAGNOSIS — L600 Ingrowing nail: Secondary | ICD-10-CM | POA: Diagnosis not present

## 2018-03-08 ENCOUNTER — Telehealth: Payer: Self-pay

## 2018-03-08 DIAGNOSIS — M67441 Ganglion, right hand: Secondary | ICD-10-CM | POA: Diagnosis not present

## 2018-03-08 DIAGNOSIS — M79641 Pain in right hand: Secondary | ICD-10-CM | POA: Diagnosis not present

## 2018-03-08 NOTE — Telephone Encounter (Signed)
   [facility] Pre-operative Risk Assessment    Request for surgical clearance:  1. What type of surgery is being performed?  Excision mass right long finger   2. When is this surgery scheduled?  03/30/18   3. What type of clearance is required (medical clearance vs. Pharmacy clearance to hold med vs. Both)? both  4. Are there any medications that need to be held prior to surgery and how long? eliquis   5. Practice name and name of physician performing surgery?  [facility]/ Dr Fredna Dow   6. What is your office phone number [phone]    7.   What is your office fax number [phone]  8.   Anesthesia type (None, local, MAC, general) ?  choice   Deborah [name] 03/08/2018, 4:27 PM  _________________________________________________________________   (provider comments below)

## 2018-03-09 ENCOUNTER — Other Ambulatory Visit: Payer: Self-pay | Admitting: Orthopedic Surgery

## 2018-03-09 NOTE — Telephone Encounter (Addendum)
Patient had an A. fib ablation on 01/26/2018 by Dr. Curt Bears.  Patient seen in the A. fib clinic 02/20/2018 by Roderic Palau, NP Her note states do not interrupt anticoagulation.  Will clarify the time period for which anticoagulation must be continuous after an ablation.  Once that is clarified, can respond to the request to hold it.   Rosaria Ferries, PA-C 03/09/2018 4:55 PM Beeper [phone]  Addendum: See note above, should not interrupt anticoagulation for 3 months following an ablation.  Left a message on the patient's home phone and cell phone asking her to call back so she can discuss this we can figure out if the surgery can be delayed until November.  Rosaria Ferries, PA-C 03/10/2018 10:05 AM Beeper [phone]

## 2018-03-10 NOTE — Telephone Encounter (Signed)
Pt's cannot interrupt anticoagulation for  3 months following an ablation. If this procedure cannot wait until she sees Dr. Curt Bears on f/u(ussually marks the end of the 3 month period), then you can ask him specifically if you can stop sooner. Deborah [name]

## 2018-03-13 NOTE — Telephone Encounter (Signed)
Pt aware procedure should not be done until after 05/02/18,  She will see Dr. Curt Bears at that time for cleance.

## 2018-03-30 ENCOUNTER — Ambulatory Visit (HOSPITAL_BASED_OUTPATIENT_CLINIC_OR_DEPARTMENT_OTHER): Admit: 2018-03-30 | Payer: 59 | Admitting: Orthopedic Surgery

## 2018-03-30 ENCOUNTER — Encounter (HOSPITAL_BASED_OUTPATIENT_CLINIC_OR_DEPARTMENT_OTHER): Payer: Self-pay

## 2018-03-30 SURGERY — EXCISION MASS
Anesthesia: Choice | Laterality: Right

## 2018-05-02 ENCOUNTER — Encounter: Payer: Self-pay | Admitting: Cardiology

## 2018-05-02 ENCOUNTER — Encounter (INDEPENDENT_AMBULATORY_CARE_PROVIDER_SITE_OTHER): Payer: Self-pay

## 2018-05-02 ENCOUNTER — Ambulatory Visit: Payer: 59 | Admitting: Cardiology

## 2018-05-02 VITALS — BP 110/74 | HR 96 | Ht 73.0 in | Wt 273.2 lb

## 2018-05-02 DIAGNOSIS — I10 Essential (primary) hypertension: Secondary | ICD-10-CM | POA: Diagnosis not present

## 2018-05-02 DIAGNOSIS — I4819 Other persistent atrial fibrillation: Secondary | ICD-10-CM

## 2018-05-02 DIAGNOSIS — Z01812 Encounter for preprocedural laboratory examination: Secondary | ICD-10-CM | POA: Diagnosis not present

## 2018-05-02 MED ORDER — PROPAFENONE HCL 225 MG PO TABS: 225.0000 mg | ORAL_TABLET | Freq: Three times a day (TID) | ORAL | 6 refills | Status: DC

## 2018-05-02 NOTE — Addendum Note (Signed)
Addended by: Marciano Sequin on: 05/02/2018 03:14 PM   Modules accepted: Orders

## 2018-05-02 NOTE — Patient Instructions (Addendum)
Medication Instructions:  Your physician has recommended you make the following change in your medication:  1. START Propafenone (Rythmol)  225 mg twice daily  If you need a refill on your cardiac medications before your next appointment, please call your pharmacy.   Lab work: Pre procedure labs: BMP & CBC If you have labs (blood work) drawn today and your tests are completely normal, you will receive your results only by: Marland Kitchen MyChart Message (if you have MyChart) OR . A paper copy in the mail If you have any lab test that is abnormal or we need to change your treatment, we will call you to review the results.  Testing/Procedures: Your physician has recommended that you have a Cardioversion (DCCV). Electrical Cardioversion uses a jolt of electricity to your heart either through paddles or wired patches attached to your chest. This is a controlled, usually prescheduled, procedure. Defibrillation is done under light anesthesia in the hospital, and you usually go home the day of the procedure. This is done to get your heart back into a normal rhythm. You are not awake for the procedure.  CARDIOVERSION INSTRUCTIONS Please arrive at the [facility] main entrance of [facility] hospital on 05/09/18 at  11:45 am Do not eat or drink after midnight prior to procedure Take your medications as normal the morning of your procedure with a sip of water. You will need someone to drive you home at discharge   Follow-Up: At [facility], you and your health needs are our priority.  As part of our continuing mission to provide you with exceptional heart care, we have created designated Provider Care Teams.  These Care Teams include your primary Cardiologist (physician) and Advanced Practice Providers (APPs -  Physician Assistants and Nurse Practitioners) who all work together to provide you with the care you need, when you need it. You will need a follow up appointment in 3 months.  Please call our office 2  months in advance to schedule this appointment.  You may see Will Meredith Leeds, MD or one of the following Advanced Practice Providers on your designated Care Team:   Chanetta Marshall, NP Tommye Standard, [place]  Thank you for choosing [facility]!!   Trinidad Curet, RN [phone]  Any Other Special Instructions Will Be Listed Below (If Applicable).  Propafenone tablets What is this medicine? PROPAFENONE (proe pa FEEN one) is an antiarrhythmic agent. It is used to treat irregular heart rhythm and can slow rapid heartbeats. This medicine can help your heart to return to and maintain a normal rhythm. This medicine may be used for other purposes; ask your health care provider or pharmacist if you have questions. COMMON BRAND NAME(S): Rythmol What should I tell my health care provider before I take this medicine? They need to know if you have any of these conditions: -heart disease -high blood levels of potassium -kidney disease -liver disease -low blood pressure -lung disease like asthma, chronic bronchitis or emphysema -pacemaker -slow heart rate -an unusual or allergic reaction to propafenone, other medicines, foods, dyes, or preservatives -pregnant or trying to get pregnant -breast-feeding How should I use this medicine? Take this medicine by mouth with a glass of water. Follow the directions on the prescription label. You can take this medicine with or without food. Take your doses at regular intervals. Do not take your medicine more often than directed. Do not stop taking except on the advice of your doctor or health care professional. Talk to your pediatrician regarding the  use of this medicine in children. Special care may be needed. Overdosage: If you think you have taken too much of this medicine contact a poison control center or emergency room at once. NOTE: This medicine is only for you. Do not share this medicine with others. What if I miss a dose? If you miss a dose, take it  as soon as you can. If it is almost time for your next dose, take only that dose. Do not take double or extra doses. What may interact with this medicine? Do not take this medicine with any of the following medications: -arsenic trioxide -certain antibiotics like clarithromycin, erythromycin, grepafloxacin, pentamidine, sparfloxacin, troleandomycin -certain medicines for depression or mental illness like amoxapine, haloperidol, maprotiline, pimozide, sertindole, thioridazine, tricyclic antidepressants, ziprasidone -certain medicines for fungal infections like fluconazole, itraconazole, ketoconazole, posaconazole, voriconazole -certain medicines for irregular heart beat like dofetilide, dronedarone -certain medicines for malaria like chloroquine, halofantrine -cisapride -droperidol -levomethadyl -ranolazine -ritonavir This medicine may also interact with the following medications: -certain medicines for angina or blood pressure -certain medicines for asthma or breathing difficulties like formoterol, salmeterol -certain medicines that treat or prevent blood clots like warfarin -cimetidine -cyclosporine -digoxin -diuretics -local anesthetics -other medicines that prolong the QT interval (cause an abnormal heart rhythm) -rifampin -theophylline This list may not describe all possible interactions. Give your health care provider a list of all the medicines, herbs, non-prescription drugs, or dietary supplements you use. Also tell them if you smoke, drink alcohol, or use illegal drugs. Some items may interact with your medicine. What should I watch for while using this medicine? Your condition will be monitored closely when you first begin therapy. Often, this drug is first started in a hospital or other monitored health care setting. Once you are on maintenance therapy, visit your doctor or health care professional for regular checks on your progress. Because your condition and use of this  medicine carry some risk, it is a good idea to carry an identification card, necklace or bracelet with details of your condition, medications, and doctor or health care professional. [place] may get drowsy or dizzy. Do not drive, use machinery, or do anything that needs mental alertness until you know how this medicine affects you. Do not stand or sit up quickly, especially if you are an older patient. This reduces the risk of dizzy or fainting spells. If you are going to have surgery, tell your doctor or health care professional that you are taking this medicine. What side effects may I notice from receiving this medicine? Side effects that you should report to your doctor or health care professional as soon as possible: -chest pain, palpitations -fever or chills -shortness of breath -swelling of feet or legs -trembling or shaking Side effects that usually do not require medical attention (report to your doctor or health care professional if they continue or are bothersome): -blurred vision -changes in taste (a metallic or bitter taste) -constipation or diarrhea -dry mouth -headache -nausea or vomiting -tiredness or weakness This list may not describe all possible side effects. Call your doctor for medical advice about side effects. You may report side effects to FDA at 1-800-FDA-1088. Where should I keep my medicine? Keep out of the reach of children. Store at room temperature between 15 and 30 degrees C (59 and 86 degrees F). Protect from light. Keep container tightly closed. Throw away any unused medicine after the expiration date. NOTE: This sheet is a summary. It may not cover all possible information. If you  have questions about this medicine, talk to your doctor, pharmacist, or health care provider.  2018 Elsevier/Gold Standard (2013-01-08 13:27:06)

## 2018-05-02 NOTE — H&P (View-Only) (Signed)
Electrophysiology Office Note   Date:  05/02/2018   ID:  Deborah [name], DOB [birth year], MRN [id number]  PCP:  Deborah Arabian, MD  Cardiologist:  Deborah [name] Primary Electrophysiologist:  Deborah Haw, MD    No chief complaint on file.    History of Present Illness: Deborah [name] is a 62 y.o. female who presents today for electrophysiology evaluation.   She has a history of paroxysmal atrial fibrillation.  She had ablation on 12/04/15. She did well for a while, but did return back to atrial fibrillation after her flecainide was stopped. She was started on flecainide and had cardioversion on 10/01/16. When she presented for cardioversion, she was in sinus rhythm.  He did not tolerate flecainide due to QRS lengthening.  She had a repeat atrial fibrillation ablation 12/2017.  Today, denies symptoms of palpitations, chest pain, shortness of breath, orthopnea, PND, lower extremity edema, claudication, dizziness, presyncope, syncope, bleeding, or neurologic sequela. The patient is tolerating medications without difficulties.  He did well for a while after her ablation, but unfortunately 2 weeks ago went into atrial fibrillation.  She is overall feeling well, but does have an uneasy and fatigued feeling at the moment.   Past Medical History:  Diagnosis Date  . A-fib ([place])   . Anemia    hx   . Anxiety   . Arthritis    "was in right knee" (01/25/2018)  . Dysrhythmia    atrial fib/flutter  . GERD (gastroesophageal reflux disease)    occ  . HTN (hypertension)   . Migraine    "used to have them more frequently before menopause; now only have a couple/yr" (01/25/2018)  . Obesity   . OSA on CPAP   . PAF (paroxysmal atrial fibrillation) ([place])    s/p DCCV 2010  . Peripheral vascular disease ([place]) 2016   after flying in leg   Past Surgical History:  Procedure Laterality Date  . ATRIAL FIBRILLATION ABLATION N/A 01/25/2018   Procedure: ATRIAL FIBRILLATION ABLATION;  Surgeon: Deborah Haw, MD;  Location: [place] [facility];  Service: Cardiovascular;  Laterality: N/A;  . CARDIAC CATHETERIZATION  2005  . CARDIOVERSION N/A 12/09/2017   Procedure: CARDIOVERSION;  Surgeon: Sanda Klein, MD;  Location: [place];  Service: Cardiovascular;  Laterality: N/A;  . CATARACT EXTRACTION W/ INTRAOCULAR LENS IMPLANT Left 2016  . ELECTROPHYSIOLOGIC STUDY N/A 12/04/2015   Procedure: Atrial Fibrillation Ablation;  Surgeon: Will Meredith Leeds, MD;  Location: [place] [facility];  Service: Cardiovascular;  Laterality: N/A;  . JOINT REPLACEMENT    . TONSILLECTOMY AND ADENOIDECTOMY    . TOTAL KNEE ARTHROPLASTY Right 04/23/2016   Procedure: TOTAL KNEE ARTHROPLASTY;  Surgeon: [name], MD;  Location: [place];  Service: Orthopedics;  Laterality: Right;  . TUBAL LIGATION       Current Outpatient Medications  Medication Sig Dispense Refill  . acetaminophen (TYLENOL) 500 MG tablet Take 1,000 mg by mouth every 6 (six) hours as needed for moderate pain or headache.    . ALPRAZolam (XANAX) 0.5 MG tablet Take 0.5 mg by mouth 3 (three) times daily as needed for anxiety.     . calcium carbonate (TUMS - DOSED IN MG ELEMENTAL CALCIUM) 500 MG chewable tablet Chew 3 tablets by mouth daily as needed for indigestion or heartburn.     . cyanocobalamin 1000 MCG tablet Take 1,000 mcg by mouth daily.     Marland Kitchen ELIQUIS 5 MG TABS tablet TAKE 1 TABLET (5 MG TOTAL) BY MOUTH 2 (  TWO) TIMES DAILY. 60 tablet 11  . escitalopram (LEXAPRO) 10 MG tablet Take 10 mg by mouth every evening.   3  . metoprolol succinate (TOPROL-XL) 50 MG 24 hr tablet TAKE 1 TABLET BY MOUTH DAILY. TAKE WITH OR IMMEDIATELY FOLLOWING A MEAL. 90 tablet 2  . propafenone (RYTHMOL) 225 MG tablet Take 1 tablet (225 mg total) by mouth every 8 (eight) hours. 60 tablet 6   No current facility-administered medications for this visit.     Allergies:   Benazepril; Adhesive [tape]; and Sulfa antibiotics   Social History:  The patient  reports that she  has never smoked. She has never used smokeless tobacco. She reports that she drinks alcohol. She reports that she does not use drugs.   Family History:  The patient's family history includes Heart disease in her mother; Stomach cancer in her paternal grandfather.   ROS:  Please see the history of present illness.   Otherwise, review of systems is positive for palpitations, cough.   All other systems are reviewed and negative.   PHYSICAL EXAM: VS:  BP 110/74   Pulse 96   Ht 6\' 1"  (1.854 m)   Wt 273 lb 3.2 oz (123.9 kg)   SpO2 94%   BMI 36.04 kg/m  , BMI Body mass index is 36.04 kg/m. GEN: Well nourished, well developed, in no acute distress  HEENT: normal  Neck: no JVD, carotid bruits, or masses Cardiac: iRRR; no murmurs, rubs, or gallops,no edema  Respiratory:  clear to auscultation bilaterally, normal work of breathing GI: soft, nontender, nondistended, + BS MS: no deformity or atrophy  Skin: warm and dry Neuro:  Strength and sensation are intact Psych: euthymic mood, full affect  EKG:  EKG is ordered today. Personal review of the ekg ordered shows atrial fibrillation, right bundle branch block, inferior T wave inversions, rate 96  Recent Labs: 12/28/2017: BUN 15; Creatinine, Ser 0.93; Hemoglobin 13.0; Platelets 310; Potassium 4.7; Sodium 143    Lipid Panel  No results found for: CHOL, TRIG, HDL, CHOLHDL, VLDL, LDLCALC, LDLDIRECT   Wt Readings from Last 3 Encounters:  05/02/18 273 lb 3.2 oz (123.9 kg)  02/20/18 276 lb (125.2 kg)  01/26/18 271 lb 2.7 oz (123 kg)      Other studies Reviewed: Additional studies/ records that were reviewed today include: TTE 11/25/15 - Left ventricle: Systolic function was normal. The estimated   ejection fraction was in the range of 50% to 55%. The study is   not technically sufficient to allow evaluation of LV diastolic   function. - Left atrium: The appendage was moderately dilated. - Atrial septum: No defect or patent foramen ovale  was identified.   ASSESSMENT AND PLAN:  1.  Persistent atrial fibrillation: Initial ablation 12/03/2025 with recurrence and repeat ablation 01/25/2018.  She did well for a while but has been in atrial fibrillation for the last 2 weeks.  Due to that we will start her on per path unknown today.  We will plan to have her cardioverted as well.    This patients CHA2DS2-VASc Score and unadjusted Ischemic Stroke Rate (% per year) is equal to 2.2 % stroke rate/year from a score of 2  Above score calculated as 1 point each if present [CHF, HTN, DM, Vascular=MI/PAD/Aortic Plaque, Age if 65-74, or Female] Above score calculated as 2 points each if present [Age > 75, or Stroke/TIA/TE]   2. Hypertension: Well-controlled.  No changes.   Current medicines are reviewed at length with the patient  today.   The patient does not have concerns regarding her medicines.  The following changes were made today: Start Rythmol  Labs/ tests ordered today include:  Orders Placed This Encounter  Procedures  . Basic metabolic panel  . CBC  . EKG 12-Lead     Disposition:   FU with Will Camnitz 3 months  Signed, Will Meredith Leeds, MD  05/02/2018 2:52 PM     [facility] [address] [place] [address] [phone] (office) [phone] (fax)

## 2018-05-02 NOTE — Progress Notes (Signed)
Electrophysiology Office Note   Date:  05/02/2018   ID:  Deborah [name], DOB Nov 24, 1955, MRN [id number]  PCP:  Gaynelle Arabian, MD  Cardiologist:  Irish Lack Primary Electrophysiologist:  Constance Haw, MD    No chief complaint on file.    History of Present Illness: Deborah [name] is a 62 y.o. female who presents today for electrophysiology evaluation.   She has a history of paroxysmal atrial fibrillation.  She had ablation on 12/04/15. She did well for a while, but did return back to atrial fibrillation after her flecainide was stopped. She was started on flecainide and had cardioversion on 10/01/16. When she presented for cardioversion, she was in sinus rhythm.  He did not tolerate flecainide due to QRS lengthening.  She had a repeat atrial fibrillation ablation 12/2017.  Today, denies symptoms of palpitations, chest pain, shortness of breath, orthopnea, PND, lower extremity edema, claudication, dizziness, presyncope, syncope, bleeding, or neurologic sequela. The patient is tolerating medications without difficulties.  He did well for a while after her ablation, but unfortunately 2 weeks ago went into atrial fibrillation.  She is overall feeling well, but does have an uneasy and fatigued feeling at the moment.   Past Medical History:  Diagnosis Date  . A-fib ([place])   . Anemia    hx   . Anxiety   . Arthritis    "was in right knee" (01/25/2018)  . Dysrhythmia    atrial fib/flutter  . GERD (gastroesophageal reflux disease)    occ  . HTN (hypertension)   . Migraine    "used to have them more frequently before menopause; now only have a couple/yr" (01/25/2018)  . Obesity   . OSA on CPAP   . PAF (paroxysmal atrial fibrillation) ([place])    s/p DCCV 2010  . Peripheral vascular disease ([place]) 2016   after flying in leg   Past Surgical History:  Procedure Laterality Date  . ATRIAL FIBRILLATION ABLATION N/A 01/25/2018   Procedure: ATRIAL FIBRILLATION ABLATION;  Surgeon: Constance Haw, MD;  Location: [facility];  Service: Cardiovascular;  Laterality: N/A;  . CARDIAC CATHETERIZATION  2005  . CARDIOVERSION N/A 12/09/2017   Procedure: CARDIOVERSION;  Surgeon: Sanda Klein, MD;  Location: [facility];  Service: Cardiovascular;  Laterality: N/A;  . CATARACT EXTRACTION W/ INTRAOCULAR LENS IMPLANT Left 2016  . ELECTROPHYSIOLOGIC STUDY N/A 12/04/2015   Procedure: Atrial Fibrillation Ablation;  Surgeon: Swetha Rayle Meredith Leeds, MD;  Location: [facility];  Service: Cardiovascular;  Laterality: N/A;  . JOINT REPLACEMENT    . TONSILLECTOMY AND ADENOIDECTOMY    . TOTAL KNEE ARTHROPLASTY Right 04/23/2016   Procedure: TOTAL KNEE ARTHROPLASTY;  Surgeon: [name], MD;  Location: [facility];  Service: Orthopedics;  Laterality: Right;  . TUBAL LIGATION       Current Outpatient Medications  Medication Sig Dispense Refill  . acetaminophen (TYLENOL) 500 MG tablet Take 1,000 mg by mouth every 6 (six) hours as needed for moderate pain or headache.    . ALPRAZolam (XANAX) 0.5 MG tablet Take 0.5 mg by mouth 3 (three) times daily as needed for anxiety.     . calcium carbonate (TUMS - DOSED IN MG ELEMENTAL CALCIUM) 500 MG chewable tablet Chew 3 tablets by mouth daily as needed for indigestion or heartburn.     . cyanocobalamin 1000 MCG tablet Take 1,000 mcg by mouth daily.     Marland Kitchen ELIQUIS 5 MG TABS tablet TAKE 1 TABLET (5 MG TOTAL) BY MOUTH 2 (  TWO) TIMES DAILY. 60 tablet 11  . escitalopram (LEXAPRO) 10 MG tablet Take 10 mg by mouth every evening.   3  . metoprolol succinate (TOPROL-XL) 50 MG 24 hr tablet TAKE 1 TABLET BY MOUTH DAILY. TAKE WITH OR IMMEDIATELY FOLLOWING A MEAL. 90 tablet 2  . propafenone (RYTHMOL) 225 MG tablet Take 1 tablet (225 mg total) by mouth every 8 (eight) hours. 60 tablet 6   No current facility-administered medications for this visit.     Allergies:   Benazepril; Adhesive [tape]; and Sulfa antibiotics   Social History:  The patient  reports that she  has never smoked. She has never used smokeless tobacco. She reports that she drinks alcohol. She reports that she does not use drugs.   Family History:  The patient's family history includes Heart disease in her mother; Stomach cancer in her paternal grandfather.   ROS:  Please see the history of present illness.   Otherwise, review of systems is positive for palpitations, cough.   All other systems are reviewed and negative.   PHYSICAL EXAM: VS:  BP 110/74   Pulse 96   Ht 6\' 1"  (1.854 m)   Wt 273 lb 3.2 oz (123.9 kg)   SpO2 94%   BMI 36.04 kg/m  , BMI Body mass index is 36.04 kg/m. GEN: Well nourished, well developed, in no acute distress  HEENT: normal  Neck: no JVD, carotid bruits, or masses Cardiac: iRRR; no murmurs, rubs, or gallops,no edema  Respiratory:  clear to auscultation bilaterally, normal work of breathing GI: soft, nontender, nondistended, + BS MS: no deformity or atrophy  Skin: warm and dry Neuro:  Strength and sensation are intact Psych: euthymic mood, full affect  EKG:  EKG is ordered today. Personal review of the ekg ordered shows atrial fibrillation, right bundle branch block, inferior T wave inversions, rate 96  Recent Labs: 12/28/2017: BUN 15; Creatinine, Ser 0.93; Hemoglobin 13.0; Platelets 310; Potassium 4.7; Sodium 143    Lipid Panel  No results found for: CHOL, TRIG, HDL, CHOLHDL, VLDL, LDLCALC, LDLDIRECT   Wt Readings from Last 3 Encounters:  05/02/18 273 lb 3.2 oz (123.9 kg)  02/20/18 276 lb (125.2 kg)  01/26/18 271 lb 2.7 oz (123 kg)      Other studies Reviewed: Additional studies/ records that were reviewed today include: TTE 11/25/15 - Left ventricle: Systolic function was normal. The estimated   ejection fraction was in the range of 50% to 55%. The study is   not technically sufficient to allow evaluation of LV diastolic   function. - Left atrium: The appendage was moderately dilated. - Atrial septum: No defect or patent foramen ovale  was identified.   ASSESSMENT AND PLAN:  1.  Persistent atrial fibrillation: Initial ablation 12/03/2025 with recurrence and repeat ablation 01/25/2018.  She did well for a while but has been in atrial fibrillation for the last 2 weeks.  Due to that we Feliciano Wynter start her on per path unknown today.  We Eaden Hettinger plan to have her cardioverted as well.    This patients CHA2DS2-VASc Score and unadjusted Ischemic Stroke Rate (% per year) is equal to 2.2 % stroke rate/year from a score of 2  Above score calculated as 1 point each if present [CHF, HTN, DM, Vascular=MI/PAD/Aortic Plaque, Age if 65-74, or Female] Above score calculated as 2 points each if present [Age > 75, or Stroke/TIA/TE]   2. Hypertension: Well-controlled.  No changes.   Current medicines are reviewed at length with the patient  today.   The patient does not have concerns regarding her medicines.  The following changes were made today: Start Rythmol  Labs/ tests ordered today include:  Orders Placed This Encounter  Procedures  . Basic metabolic panel  . CBC  . EKG 12-Lead     Disposition:   FU with Jacqeline Broers 3 months  Signed, Sherill Mangen Meredith Leeds, MD  05/02/2018 2:52 PM     [place] [address] [place] [address] [phone] (office) [phone] (fax)

## 2018-05-03 LAB — BASIC METABOLIC PANEL
BUN/Creatinine Ratio: 17 (ref 12–28)
BUN: 17 mg/dL (ref 8–27)
CALCIUM: 9.3 mg/dL (ref 8.7–10.3)
CHLORIDE: 107 mmol/L — AB (ref 96–106)
CO2: 24 mmol/L (ref 20–29)
Creatinine, Ser: 1.01 mg/dL — ABNORMAL HIGH (ref 0.57–1.00)
GFR calc non Af Amer: 59 mL/min/{1.73_m2} — ABNORMAL LOW (ref >59)
GFR, EST AFRICAN AMERICAN: 68 mL/min/{1.73_m2} (ref >59)
Glucose: 92 mg/dL (ref 65–99)
POTASSIUM: 5 mmol/L (ref 3.5–5.2)
Sodium: 144 mmol/L (ref 134–144)

## 2018-05-03 LAB — CBC
Hematocrit: 40.3 % (ref 34.0–46.6)
Hemoglobin: 12.1 g/dL (ref 11.1–15.9)
MCH: 25.9 pg — ABNORMAL LOW (ref 26.6–33.0)
MCHC: 30 g/dL — ABNORMAL LOW (ref 31.5–35.7)
MCV: 86 fL (ref 79–97)
PLATELETS: 322 10*3/uL (ref 150–450)
RBC: 4.67 x10E6/uL (ref 3.77–5.28)
RDW: 14.4 % (ref 12.3–15.4)
WBC: 7.3 10*3/uL (ref 3.4–10.8)

## 2018-05-04 ENCOUNTER — Encounter: Payer: Self-pay | Admitting: Cardiology

## 2018-05-04 NOTE — Telephone Encounter (Signed)
Please call with lab results 

## 2018-05-04 NOTE — Telephone Encounter (Signed)
This encounter was created in error - please disregard.

## 2018-05-09 ENCOUNTER — Encounter (HOSPITAL_COMMUNITY)
Admission: RE | Disposition: A | Discharge status: Home / Self Care | Payer: Self-pay | Source: Ambulatory Visit | Attending: Cardiovascular Disease

## 2018-05-09 ENCOUNTER — Other Ambulatory Visit: Payer: Self-pay

## 2018-05-09 ENCOUNTER — Encounter (HOSPITAL_COMMUNITY): Payer: Self-pay

## 2018-05-09 ENCOUNTER — Ambulatory Visit (HOSPITAL_COMMUNITY)
Admission: RE | Admit: 2018-05-09 | Discharge: 2018-05-09 | Disposition: A | Discharge status: Home / Self Care | Payer: 59 | Source: Ambulatory Visit | Attending: Cardiovascular Disease | Admitting: Cardiovascular Disease

## 2018-05-09 ENCOUNTER — Ambulatory Visit (HOSPITAL_COMMUNITY): Payer: 59 | Admitting: Anesthesiology

## 2018-05-09 DIAGNOSIS — Z6836 Body mass index (BMI) 36.0-36.9, adult: Secondary | ICD-10-CM | POA: Diagnosis not present

## 2018-05-09 DIAGNOSIS — I10 Essential (primary) hypertension: Secondary | ICD-10-CM | POA: Diagnosis not present

## 2018-05-09 DIAGNOSIS — Z7901 Long term (current) use of anticoagulants: Secondary | ICD-10-CM | POA: Diagnosis not present

## 2018-05-09 DIAGNOSIS — K219 Gastro-esophageal reflux disease without esophagitis: Secondary | ICD-10-CM | POA: Diagnosis not present

## 2018-05-09 DIAGNOSIS — I4510 Unspecified right bundle-branch block: Secondary | ICD-10-CM | POA: Insufficient documentation

## 2018-05-09 DIAGNOSIS — G4733 Obstructive sleep apnea (adult) (pediatric): Secondary | ICD-10-CM | POA: Insufficient documentation

## 2018-05-09 DIAGNOSIS — E669 Obesity, unspecified: Secondary | ICD-10-CM | POA: Insufficient documentation

## 2018-05-09 DIAGNOSIS — F419 Anxiety disorder, unspecified: Secondary | ICD-10-CM | POA: Diagnosis not present

## 2018-05-09 DIAGNOSIS — M1990 Unspecified osteoarthritis, unspecified site: Secondary | ICD-10-CM | POA: Insufficient documentation

## 2018-05-09 DIAGNOSIS — Z8249 Family history of ischemic heart disease and other diseases of the circulatory system: Secondary | ICD-10-CM | POA: Insufficient documentation

## 2018-05-09 DIAGNOSIS — I4891 Unspecified atrial fibrillation: Secondary | ICD-10-CM

## 2018-05-09 DIAGNOSIS — I4819 Other persistent atrial fibrillation: Secondary | ICD-10-CM | POA: Diagnosis present

## 2018-05-09 DIAGNOSIS — Z882 Allergy status to sulfonamides status: Secondary | ICD-10-CM | POA: Insufficient documentation

## 2018-05-09 HISTORY — PX: CARDIOVERSION: SHX1299

## 2018-05-09 SURGERY — CARDIOVERSION
Anesthesia: General

## 2018-05-09 MED ORDER — LIDOCAINE HCL (CARDIAC) PF 100 MG/5ML IV SOSY
PREFILLED_SYRINGE | INTRAVENOUS | Status: DC | PRN
  Administered 2018-05-09: 60 mg via INTRAVENOUS

## 2018-05-09 MED ORDER — PROPOFOL 10 MG/ML IV BOLUS
INTRAVENOUS | Status: DC | PRN
  Administered 2018-05-09: 20 mg via INTRAVENOUS
  Administered 2018-05-09: 80 mg via INTRAVENOUS

## 2018-05-09 MED ORDER — SODIUM CHLORIDE 0.9 % IV SOLN
INTRAVENOUS | Status: DC | PRN
  Administered 2018-05-09: 13:00:00 via INTRAVENOUS

## 2018-05-09 NOTE — Discharge Instructions (Signed)
Monitored Anesthesia Care Anesthesia is a term that refers to techniques, procedures, and medicines that help a person stay safe and comfortable during a medical procedure. Monitored anesthesia care, or sedation, is one type of anesthesia. Your anesthesia specialist may recommend sedation if you will be having a procedure that does not require you to be unconscious, such as:  Cataract surgery.  A dental procedure.  A biopsy.  A colonoscopy.  During the procedure, you may receive a medicine to help you relax (sedative). There are three levels of sedation:  Mild sedation. At this level, you may feel awake and relaxed. You will be able to follow directions.  Moderate sedation. At this level, you will be sleepy. You may not remember the procedure.  Deep sedation. At this level, you will be asleep. You will not remember the procedure.  The more medicine you are given, the deeper your level of sedation will be. Depending on how you respond to the procedure, the anesthesia specialist may change your level of sedation or the type of anesthesia to fit your needs. An anesthesia specialist will monitor you closely during the procedure. Let your health care provider know about:  Any allergies you have.  All medicines you are taking, including vitamins, herbs, eye drops, creams, and over-the-counter medicines.  Any use of steroids (by mouth or as a cream).  Any problems you or family members have had with sedatives and anesthetic medicines.  Any blood disorders you have.  Any surgeries you have had.  Any medical conditions you have, such as sleep apnea.  Whether you are pregnant or may be pregnant.  Any use of cigarettes, alcohol, or street drugs. What are the risks? Generally, this is a safe procedure. However, problems may occur, including:  Getting too much medicine (oversedation).  Nausea.  Allergic reaction to medicines.  Trouble breathing. If this happens, a breathing tube  may be used to help with breathing. It will be removed when you are awake and breathing on your own.  Heart trouble.  Lung trouble.  Before the procedure Staying hydrated Follow instructions from your health care provider about hydration, which may include:  Up to 2 hours before the procedure - you may continue to drink clear liquids, such as water, clear fruit juice, black coffee, and plain tea.  Eating and drinking restrictions Follow instructions from your health care provider about eating and drinking, which may include:  8 hours before the procedure - stop eating heavy meals or foods such as meat, fried foods, or fatty foods.  6 hours before the procedure - stop eating light meals or foods, such as toast or cereal.  6 hours before the procedure - stop drinking milk or drinks that contain milk.  2 hours before the procedure - stop drinking clear liquids.  Medicines Ask your health care provider about:  Changing or stopping your regular medicines. This is especially important if you are taking diabetes medicines or blood thinners.  Taking medicines such as aspirin and ibuprofen. These medicines can thin your blood. Do not take these medicines before your procedure if your health care provider instructs you not to.  Tests and exams  You will have a physical exam.  You may have blood tests done to show: ? How well your kidneys and liver are working. ? How well your blood can clot.  General instructions  Plan to have someone take you home from the hospital or clinic.  If you will be going home right after the  procedure, plan to have someone with you for 24 hours.  What happens during the procedure?  Your blood pressure, heart rate, breathing, level of pain and overall condition will be monitored.  An IV tube will be inserted into one of your veins.  Your anesthesia specialist will give you medicines as needed to keep you comfortable during the procedure. This may  mean changing the level of sedation.  The procedure will be performed. After the procedure  Your blood pressure, heart rate, breathing rate, and blood oxygen level will be monitored until the medicines you were given have worn off.  Do not drive for 24 hours if you received a sedative.  You may: ? Feel sleepy, clumsy, or nauseous. ? Feel forgetful about what happened after the procedure. ? Have a sore throat if you had a breathing tube during the procedure. ? Vomit. This information is not intended to replace advice given to you by your health care provider. Make sure you discuss any questions you have with your health care provider. Document Released: 03/10/2005 Document Revised: 11/21/2015 Document Reviewed: 10/05/2015 Elsevier Interactive Patient Education  2018 [employer]. Hospital doctor cardioversion is the delivery of a jolt of electricity to restore a normal rhythm to the heart. A rhythm that is too fast or is not regular keeps the heart from pumping well. In this procedure, sticky patches or metal paddles are placed on the chest to deliver electricity to the heart from a device. This procedure may be done in an emergency if:  There is low or no blood pressure as a result of the heart rhythm.  Normal rhythm must be restored as fast as possible to protect the brain and heart from further damage.  It may save a life.  This procedure may also be done for irregular or fast heart rhythms that are not immediately life-threatening. Tell a health care provider about:  Any allergies you have.  All medicines you are taking, including vitamins, herbs, eye drops, creams, and over-the-counter medicines.  Any problems you or family members have had with anesthetic medicines.  Any blood disorders you have.  Any surgeries you have had.  Any medical conditions you have.  Whether you are pregnant or may be pregnant. What are the risks? Generally, this is a  safe procedure. However, problems may occur, including:  Allergic reactions to medicines.  A blood clot that breaks free and travels to other parts of your body.  The possible return of an abnormal heart rhythm within hours or days after the procedure.  Your heart stopping (cardiac arrest). This is rare.  What happens before the procedure? Medicines  Your health care provider may have you start taking: ? Blood-thinning medicines (anticoagulants) so your blood does not clot as easily. ? Medicines may be given to help stabilize your heart rate and rhythm.  Ask your health care provider about changing or stopping your regular medicines. This is especially important if you are taking diabetes medicines or blood thinners. General instructions  Plan to have someone take you home from the hospital or clinic.  If you will be going home right after the procedure, plan to have someone with you for 24 hours.  Follow instructions from your health care provider about eating or drinking restrictions. What happens during the procedure?  To lower your risk of infection: ? Your health care team will wash or sanitize their hands. ? Your skin will be washed with soap.  An IV tube will be inserted  into one of your veins.  You will be given a medicine to help you relax (sedative).  Sticky patches (electrodes) or metal paddles may be placed on your chest.  An electrical shock will be delivered. The procedure may vary among health care providers and hospitals. What happens after the procedure?  Your blood pressure, heart rate, breathing rate, and blood oxygen level will be monitored until the medicines you were given have worn off.  Do not drive for 24 hours if you were given a sedative.  Your heart rhythm will be watched to make sure it does not change. This information is not intended to replace advice given to you by your health care provider. Make sure you discuss any questions you have  with your health care provider. Document Released: 06/04/2002 Document Revised: 02/11/2016 Document Reviewed: 12/19/2015 Elsevier Interactive Patient Education  2017 [employer].

## 2018-05-09 NOTE — Anesthesia Preprocedure Evaluation (Signed)
Anesthesia Evaluation  Patient identified by MRN, date of birth, ID band Patient awake    Reviewed: Allergy & Precautions, H&P , NPO status , Patient's Chart, lab work & pertinent test results  Airway Mallampati: II   Neck ROM: full    Dental   Pulmonary sleep apnea ,    breath sounds clear to auscultation       Cardiovascular hypertension, + Peripheral Vascular Disease  + dysrhythmias Atrial Fibrillation  Rhythm:irregular Rate:Normal     Neuro/Psych  Headaches, PSYCHIATRIC DISORDERS Anxiety    GI/Hepatic GERD  ,  Endo/Other  obese  Renal/GU      Musculoskeletal  (+) Arthritis ,   Abdominal   Peds  Hematology   Anesthesia Other Findings   Reproductive/Obstetrics                             Anesthesia Physical Anesthesia Plan  ASA: III  Anesthesia Plan: General   Post-op Pain Management:    Induction: Intravenous  PONV Risk Score and Plan: 3 and Propofol infusion and Treatment may vary due to age or medical condition  Airway Management Planned: Mask  Additional Equipment:   Intra-op Plan:   Post-operative Plan:   Informed Consent: I have reviewed the patients History and Physical, chart, labs and discussed the procedure including the risks, benefits and alternatives for the proposed anesthesia with the patient or authorized representative who has indicated his/her understanding and acceptance.     Plan Discussed with: CRNA, Anesthesiologist and Surgeon  Anesthesia Plan Comments:         Anesthesia Quick Evaluation

## 2018-05-09 NOTE — CV Procedure (Signed)
[place]: Anesthesia: Propofol No missed doses eliquis  DCC x 2 120 J then 150 J biphasic Converted from afib rate 78 to NSR rate 68  No immediate neurologic sequelae  Jenkins Rouge

## 2018-05-09 NOTE — Interval H&P Note (Signed)
History and Physical Interval Note:  05/09/2018 11:50 AM  Deborah [name]  has presented today for surgery, with the diagnosis of AFIB  The various methods of treatment have been discussed with the patient and family. After consideration of risks, benefits and other options for treatment, the patient has consented to  Procedure(s): CARDIOVERSION (N/A) as a surgical intervention .  The patient's history has been reviewed, patient examined, no change in status, stable for surgery.  I have reviewed the patient's chart and labs.  Questions were answered to the patient's satisfaction.     Jenkins Rouge

## 2018-05-09 NOTE — Transfer of Care (Signed)
Immediate Anesthesia Transfer of Care Note  Patient: Deborah [name]  Procedure(s) Performed: CARDIOVERSION (N/A )  Patient Location: Endoscopy Unit  Anesthesia Type:General  Level of Consciousness: awake, alert  and oriented  Airway & Oxygen Therapy: Patient Spontanous Breathing  Post-op Assessment: Report given to RN and Post -op Vital signs reviewed and stable  Post vital signs: Reviewed and stable  Last Vitals:  Vitals Value Taken Time  BP    Temp    Pulse    Resp    SpO2      Last Pain:  Vitals:   05/09/18 1158  TempSrc: Oral  PainSc: 0-No pain         Complications: No apparent anesthesia complications

## 2018-05-09 NOTE — Anesthesia Postprocedure Evaluation (Signed)
Anesthesia Post Note  Patient: MONAI HINDES  Procedure(s) Performed: CARDIOVERSION (N/A )     Patient location during evaluation: Endoscopy Anesthesia Type: General Level of consciousness: awake and alert Pain management: pain level controlled Vital Signs Assessment: post-procedure vital signs reviewed and stable Respiratory status: spontaneous breathing, nonlabored ventilation, respiratory function stable and patient connected to nasal cannula oxygen Cardiovascular status: blood pressure returned to baseline and stable Postop Assessment: no apparent nausea or vomiting Anesthetic complications: no    Last Vitals:  Vitals:   05/09/18 1255 05/09/18 1300  BP: 112/65 110/73  Pulse: 62 (!) 57  Resp: 19 16  Temp: 36.4 C   SpO2: 97% 96%    Last Pain:  Vitals:   05/09/18 1310  TempSrc:   PainSc: 0-No pain                 Quincie Haroon S

## 2018-05-10 ENCOUNTER — Encounter (HOSPITAL_COMMUNITY): Payer: Self-pay | Admitting: Cardiovascular Disease

## 2018-05-30 DIAGNOSIS — Z961 Presence of intraocular lens: Secondary | ICD-10-CM | POA: Diagnosis not present

## 2018-05-30 DIAGNOSIS — H35052 Retinal neovascularization, unspecified, left eye: Secondary | ICD-10-CM | POA: Diagnosis not present

## 2018-05-30 DIAGNOSIS — H3581 Retinal edema: Secondary | ICD-10-CM | POA: Diagnosis not present

## 2018-06-14 ENCOUNTER — Other Ambulatory Visit: Payer: Self-pay | Admitting: Cardiology

## 2018-06-14 MED ORDER — PROPAFENONE HCL 225 MG PO TABS: 225.0000 mg | ORAL_TABLET | Freq: Two times a day (BID) | ORAL | 10 refills | Status: DC

## 2018-08-02 ENCOUNTER — Encounter: Payer: Self-pay | Admitting: Cardiology

## 2018-08-02 ENCOUNTER — Encounter: Payer: Self-pay | Admitting: *Deleted

## 2018-08-02 ENCOUNTER — Ambulatory Visit: Payer: 59 | Admitting: Cardiology

## 2018-08-02 VITALS — BP 124/70 | HR 63 | Ht 73.0 in | Wt 282.0 lb

## 2018-08-02 DIAGNOSIS — I4819 Other persistent atrial fibrillation: Secondary | ICD-10-CM

## 2018-08-02 DIAGNOSIS — G4730 Sleep apnea, unspecified: Secondary | ICD-10-CM

## 2018-08-02 NOTE — Progress Notes (Signed)
Electrophysiology Office Note   Date:  08/02/2018   ID:  Deborah [name], DOB [birth year], MRN [id number]  PCP:  Gaynelle Arabian, MD  Cardiologist:  Irish Lack Primary Electrophysiologist:  Constance Haw, MD    No chief complaint on file.    History of Present Illness: Deborah [name] is a 62 y.o. female who presents today for electrophysiology evaluation.   She has a history of paroxysmal atrial fibrillation.  She had ablation on 12/04/15. She did well for a while, but did return back to atrial fibrillation after her flecainide was stopped. She was started on flecainide and had cardioversion on 10/01/16. When she presented for cardioversion, she was in sinus rhythm.  He did not tolerate flecainide due to QRS lengthening.  She had a repeat atrial fibrillation ablation 12/2017.  She unfortunately came back into clinic in atrial fibrillation and had a repeat cardioversion.  Today, denies symptoms of palpitations, chest pain, shortness of breath, orthopnea, PND, lower extremity edema, claudication, dizziness, presyncope, syncope, bleeding, or neurologic sequela. The patient is tolerating medications without difficulties.  Cardioversion, she is felt well.  She is noted no further episodes of atrial fibrillation.   Past Medical History:  Diagnosis Date  . A-fib ([place])   . Anemia    hx   . Anxiety   . Arthritis    "was in right knee" (01/25/2018)  . Dysrhythmia    atrial fib/flutter  . GERD (gastroesophageal reflux disease)    occ  . HTN (hypertension)   . Migraine    "used to have them more frequently before menopause; now only have a couple/yr" (01/25/2018)  . Obesity   . OSA on CPAP   . PAF (paroxysmal atrial fibrillation) ([place])    s/p DCCV 2010  . Peripheral vascular disease ([place]) 2016   after flying in leg   Past Surgical History:  Procedure Laterality Date  . ATRIAL FIBRILLATION ABLATION N/A 01/25/2018   Procedure: ATRIAL FIBRILLATION ABLATION;  Surgeon: Constance Haw, MD;   Location: [facility];  Service: Cardiovascular;  Laterality: N/A;  . CARDIAC CATHETERIZATION  2005  . CARDIOVERSION N/A 12/09/2017   Procedure: CARDIOVERSION;  Surgeon: Sanda Klein, MD;  Location: [facility];  Service: Cardiovascular;  Laterality: N/A;  . CARDIOVERSION N/A 05/09/2018   Procedure: CARDIOVERSION;  Surgeon: Josue Hector, MD;  Location: [facility];  Service: Cardiovascular;  Laterality: N/A;  . CATARACT EXTRACTION W/ INTRAOCULAR LENS IMPLANT Left 2016  . ELECTROPHYSIOLOGIC STUDY N/A 12/04/2015   Procedure: Atrial Fibrillation Ablation;  Surgeon: Lemoyne Scarpati Meredith Leeds, MD;  Location: [facility];  Service: Cardiovascular;  Laterality: N/A;  . JOINT REPLACEMENT    . TONSILLECTOMY AND ADENOIDECTOMY    . TOTAL KNEE ARTHROPLASTY Right 04/23/2016   Procedure: TOTAL KNEE ARTHROPLASTY;  Surgeon: [name], MD;  Location: [facility];  Service: Orthopedics;  Laterality: Right;  . TUBAL LIGATION       Current Outpatient Medications  Medication Sig Dispense Refill  . acetaminophen (TYLENOL) 500 MG tablet Take 1,000 mg by mouth every 6 (six) hours as needed for moderate pain or headache.    . ALPRAZolam (XANAX) 0.5 MG tablet Take 0.5 mg by mouth 3 (three) times daily as needed for anxiety.     . calcium carbonate (TUMS - DOSED IN MG ELEMENTAL CALCIUM) 500 MG chewable tablet Chew 3 tablets by mouth daily as needed for indigestion or heartburn.     . cyanocobalamin 1000 MCG tablet Take 1,000 mcg by mouth  daily.     Marland Kitchen ELIQUIS 5 MG TABS tablet TAKE 1 TABLET (5 MG TOTAL) BY MOUTH 2 (TWO) TIMES DAILY. 60 tablet 11  . escitalopram (LEXAPRO) 10 MG tablet Take 10 mg by mouth every evening.   3  . metoprolol succinate (TOPROL-XL) 50 MG 24 hr tablet TAKE 1 TABLET BY MOUTH DAILY. TAKE WITH OR IMMEDIATELY FOLLOWING A MEAL. 90 tablet 2  . propafenone (RYTHMOL) 225 MG tablet Take 1 tablet (225 mg total) by mouth 2 (two) times daily. 60 tablet 10   No current facility-administered  medications for this visit.     Allergies:   Benazepril; Adhesive [tape]; and Sulfa antibiotics   Social History:  The patient  reports that she has never smoked. She has never used smokeless tobacco. She reports current alcohol use. She reports that she does not use drugs.   Family History:  The patient's family history includes Heart disease in her mother; Stomach cancer in her paternal grandfather.   ROS:  Please see the history of present illness.   Otherwise, review of systems is positive for none.   All other systems are reviewed and negative.   PHYSICAL EXAM: VS:  BP 124/70   Pulse 63   Ht 6\' 1"  (1.854 m)   Wt 282 lb (127.9 kg)   BMI 37.21 kg/m  , BMI Body mass index is 37.21 kg/m. GEN: Well nourished, well developed, in no acute distress  HEENT: normal  Neck: no JVD, carotid bruits, or masses Cardiac: RRR; no murmurs, rubs, or gallops,no edema  Respiratory:  clear to auscultation bilaterally, normal work of breathing GI: soft, nontender, nondistended, + BS MS: no deformity or atrophy  Skin: warm and dry Neuro:  Strength and sensation are intact Psych: euthymic mood, full affect  EKG:  EKG is ordered today. Personal review of the ekg ordered shows this rhythm, right bundle branch block, rate 63  Recent Labs: 05/02/2018: BUN 17; Creatinine, Ser 1.01; Hemoglobin 12.1; Platelets 322; Potassium 5.0; Sodium 144    Lipid Panel  No results found for: CHOL, TRIG, HDL, CHOLHDL, VLDL, LDLCALC, LDLDIRECT   Wt Readings from Last 3 Encounters:  08/02/18 282 lb (127.9 kg)  05/09/18 260 lb (117.9 kg)  05/02/18 273 lb 3.2 oz (123.9 kg)      Other studies Reviewed: Additional studies/ records that were reviewed today include: TTE 11/25/15 - Left ventricle: Systolic function was normal. The estimated   ejection fraction was in the range of 50% to 55%. The study is   not technically sufficient to allow evaluation of LV diastolic   function. - Left atrium: The appendage was  moderately dilated. - Atrial septum: No defect or patent foramen ovale was identified.   ASSESSMENT AND PLAN:  1.  Persistent atrial fibrillation: Initial ablation 12/04/2015 with repeat ablation 01/25/2018.  She did return back to atrial fibrillation.  She was started on per path known and cardioverted.  She has done well without further episodes.  Continue anticoagulation.   This patients CHA2DS2-VASc Score and unadjusted Ischemic Stroke Rate (% per year) is equal to 2.2 % stroke rate/year from a score of 2  Above score calculated as 1 point each if present [CHF, HTN, DM, Vascular=MI/PAD/Aortic Plaque, Age if 65-74, or Female] Above score calculated as 2 points each if present [Age > 75, or Stroke/TIA/TE]   2. Hypertension: Currently well controlled  3.  Obstructive sleep apnea: Is compliant with CPAP.  We Shalondra Wunschel set her up for follow-up appointment with sleep  medicine.  Current medicines are reviewed at length with the patient today.   The patient does not have concerns regarding her medicines.  The following changes were made today: None  Labs/ tests ordered today include:  Orders Placed This Encounter  Procedures  . EKG 12-Lead     Disposition:   FU with Tonnia Bardin 3 months  Signed, Rigo Letts Meredith Leeds, MD  08/02/2018 3:00 PM     [place] [address] [phone] (office) [phone] (fax)

## 2018-08-02 NOTE — Patient Instructions (Addendum)
Medication Instructions:  Your physician recommends that you continue on your current medications as directed. Please refer to the Current Medication list given to you today.  * If you need a refill on your cardiac medications before your next appointment, please call your pharmacy.   Labwork: None ordered  Testing/Procedures: None ordered  Follow-Up: Your physician recommends that you schedule a follow-up appointment in: 3 months with Dr. Curt Bears.  You have been referred to re-establish with Dr. Radford Pax for your sleep apnea.  Thank you for choosing [facility]!!   Trinidad Curet, RN [phone]

## 2018-08-02 NOTE — Addendum Note (Signed)
Addended by: [name] on: 08/02/2018 03:13 PM   Modules accepted: Orders

## 2018-08-14 NOTE — Progress Notes (Addendum)
Cardiology Office Note:    Date:  08/15/2018   ID:  Deborah [name], DOB [birth year], MRN [id number]  PCP:  Gaynelle Arabian, MD  Cardiologist:  No primary care provider on file.    Referring MD: Gaynelle Arabian, MD   Chief Complaint  Patient presents with  . Sleep Apnea  . Atrial Fibrillation  . Hypertension    History of Present Illness:    Deborah [name] is a 63 y.o. female with a hx of severe OSA, obesity, HTN and PAF.  She is here today for followup and is doing well.  She denies any chest pain or pressure, SOB, DOE, PND, orthopnea, LE edema, dizziness, palpitations or syncope. She is compliant with her meds and is tolerating meds with no SE.  She is doing well with her CPAP device.  She tolerates the mask and feels the pressure is adequate.  Since going on CPAP she feels rested in the am and has no significant daytime sleepiness.  She denies any significant mouth or nasal dryness or nasal congestion.  She does not think that he snores.     Past Medical History:  Diagnosis Date  . A-fib ([place])   . Anemia    hx   . Anxiety   . Arthritis    "was in right knee" (01/25/2018)  . Dysrhythmia    atrial fib/flutter  . GERD (gastroesophageal reflux disease)    occ  . HTN (hypertension)   . Migraine    "used to have them more frequently before menopause; now only have a couple/yr" (01/25/2018)  . Obesity   . OSA on CPAP   . PAF (paroxysmal atrial fibrillation) ([place])    s/p DCCV 2010  . Peripheral vascular disease ([place]) 2016   after flying in leg    Past Surgical History:  Procedure Laterality Date  . ATRIAL FIBRILLATION ABLATION N/A 01/25/2018   Procedure: ATRIAL FIBRILLATION ABLATION;  Surgeon: Constance Haw, MD;  Location: [facility];  Service: Cardiovascular;  Laterality: N/A;  . CARDIAC CATHETERIZATION  2005  . CARDIOVERSION N/A 12/09/2017   Procedure: CARDIOVERSION;  Surgeon: Sanda Klein, MD;  Location: [facility];  Service: Cardiovascular;  Laterality:  N/A;  . CARDIOVERSION N/A 05/09/2018   Procedure: CARDIOVERSION;  Surgeon: Josue Hector, MD;  Location: [facility];  Service: Cardiovascular;  Laterality: N/A;  . CATARACT EXTRACTION W/ INTRAOCULAR LENS IMPLANT Left 2016  . ELECTROPHYSIOLOGIC STUDY N/A 12/04/2015   Procedure: Atrial Fibrillation Ablation;  Surgeon: Will Meredith Leeds, MD;  Location: [facility];  Service: Cardiovascular;  Laterality: N/A;  . JOINT REPLACEMENT    . TONSILLECTOMY AND ADENOIDECTOMY    . TOTAL KNEE ARTHROPLASTY Right 04/23/2016   Procedure: TOTAL KNEE ARTHROPLASTY;  Surgeon: [name], MD;  Location: [facility];  Service: Orthopedics;  Laterality: Right;  . TUBAL LIGATION      Current Medications: Current Meds  Medication Sig  . acetaminophen (TYLENOL) 500 MG tablet Take 1,000 mg by mouth every 6 (six) hours as needed for moderate pain or headache.  . ALPRAZolam (XANAX) 0.5 MG tablet Take 0.5 mg by mouth 3 (three) times daily as needed for anxiety.   . calcium carbonate (TUMS - DOSED IN MG ELEMENTAL CALCIUM) 500 MG chewable tablet Chew 3 tablets by mouth daily as needed for indigestion or heartburn.   . cyanocobalamin 1000 MCG tablet Take 1,000 mcg by mouth daily.   Marland Kitchen ELIQUIS 5 MG TABS tablet TAKE 1 TABLET (5 MG TOTAL) BY MOUTH  2 (TWO) TIMES DAILY.  Marland Kitchen escitalopram (LEXAPRO) 10 MG tablet Take 10 mg by mouth every evening.   . metoprolol succinate (TOPROL-XL) 50 MG 24 hr tablet TAKE 1 TABLET BY MOUTH DAILY. TAKE WITH OR IMMEDIATELY FOLLOWING A MEAL.  Marland Kitchen propafenone (RYTHMOL) 225 MG tablet Take 1 tablet (225 mg total) by mouth 2 (two) times daily.     Allergies:   Benazepril; Adhesive [tape]; and Sulfa antibiotics   Social History   Socioeconomic History  . Marital status: Married    Spouse name: Not on file  . Number of children: Not on file  . Years of education: Not on file  . Highest education level: Not on file  Occupational History  . Not on file  Social Needs  . Financial resource  strain: Not on file  . Food insecurity:    Worry: Not on file    Inability: Not on file  . Transportation needs:    Medical: Not on file    Non-medical: Not on file  Tobacco Use  . Smoking status: Never Smoker  . Smokeless tobacco: Never Used  Substance and Sexual Activity  . Alcohol use: Yes    Alcohol/week: 0.0 standard drinks    Comment: 01/25/2018 "couple drinks/month; if that"  . Drug use: Never  . Sexual activity: Yes  Lifestyle  . Physical activity:    Days per week: Not on file    Minutes per session: Not on file  . Stress: Not on file  Relationships  . Social connections:    Talks on phone: Not on file    Gets together: Not on file    Attends religious service: Not on file    Active member of club or organization: Not on file    Attends meetings of clubs or organizations: Not on file    Relationship status: Not on file  Other Topics Concern  . Not on file  Social History Narrative  . Not on file     Family History: The patient's family history includes Heart disease in her mother; Stomach cancer in her paternal grandfather. There is no history of Colon cancer.  ROS:   Please see the history of present illness.    ROS  All other systems reviewed and negative.   EKGs/Labs/Other Studies Reviewed:    The following studies were reviewed today: PAP download  EKG:  EKG is not ordered today  Recent Labs: 05/02/2018: BUN 17; Creatinine, Ser 1.01; Hemoglobin 12.1; Platelets 322; Potassium 5.0; Sodium 144   Recent Lipid Panel No results found for: CHOL, TRIG, HDL, CHOLHDL, VLDL, LDLCALC, LDLDIRECT  Physical Exam:    VS:  BP 120/82   Pulse 62   Ht 6\' 1"  (1.854 m)   Wt 286 lb 12.8 oz (130.1 kg)   SpO2 95%   BMI 37.84 kg/m     Wt Readings from Last 3 Encounters:  08/15/18 286 lb 12.8 oz (130.1 kg)  08/02/18 282 lb (127.9 kg)  05/09/18 260 lb (117.9 kg)     GEN:  Well nourished, well developed in no acute distress HEENT: Normal NECK: No JVD; No  carotid bruits LYMPHATICS: No lymphadenopathy CARDIAC: RRR, no murmurs, rubs, gallops RESPIRATORY:  Clear to auscultation without rales, wheezing or rhonchi  ABDOMEN: Soft, non-tender, non-distended MUSCULOSKELETAL:  No edema; No deformity  SKIN: Warm and dry NEUROLOGIC:  Alert and oriented x 3 PSYCHIATRIC:  Normal affect   ASSESSMENT:    1. Obstructive sleep apnea syndrome   2. Essential hypertension  3. Paroxysmal atrial fibrillation (HCC)   4. Class 2 severe obesity due to excess calories with serious comorbidity in adult, unspecified BMI ([place])    PLAN:    In order of problems listed above:  1.  OSA - the patient is tolerating PAP therapy well without any problems. The PAP download was reviewed today and showed an AHI of 2.2/hr on 8 cm H2O with 99% compliance in using more than 4 hours nightly.  The patient has been using and benefiting from PAP use and will continue to benefit from therapy. She recently got an error message on her device saying that the motor is worn out.  I will order her a new CPAP device and she will see me back in 10 weeks as mandated by Insurance to document compliance.   2.  HTN - BP is controlled on exam today.  Continue on Toprol XL 50mg  daily.   3.  PAF - she appears to be in NSR today.  Continue on BB and Eliquis 5mg  BID. She has not had any significant bleeding problems on anticoagulation and her Creatinine was 1.01 and Hbg 12.1 in Nov 2019.   4.  Obesity - I have encouraged her to get into a routine exercise program and cut back on carbs and portions.    Medication Adjustments/Labs and Tests Ordered: Current medicines are reviewed at length with the patient today.  Concerns regarding medicines are outlined above.  No orders of the defined types were placed in this encounter.  No orders of the defined types were placed in this encounter.   Signed, Fransico Him, MD  08/15/2018 3:54 PM    [place]

## 2018-08-15 ENCOUNTER — Encounter: Payer: Self-pay | Admitting: Cardiology

## 2018-08-15 ENCOUNTER — Ambulatory Visit: Payer: 59 | Admitting: Cardiology

## 2018-08-15 VITALS — BP 120/82 | HR 62 | Ht 73.0 in | Wt 286.8 lb

## 2018-08-15 DIAGNOSIS — I480 Paroxysmal atrial fibrillation: Secondary | ICD-10-CM | POA: Diagnosis not present

## 2018-08-15 DIAGNOSIS — G4733 Obstructive sleep apnea (adult) (pediatric): Secondary | ICD-10-CM | POA: Diagnosis not present

## 2018-08-15 DIAGNOSIS — I10 Essential (primary) hypertension: Secondary | ICD-10-CM | POA: Diagnosis not present

## 2018-08-15 NOTE — Patient Instructions (Signed)
Medication Instructions:  Your provider recommends that you continue on your current medications as directed. Please refer to the Current Medication list given to you today.    Labwork: None  Testing/Procedures: None  Follow-Up: We will call you to arrange appropriate follow-up.  Any Other Special Instructions Will Be Listed Below (If Applicable). We have ordered a new CPAP for you!

## 2018-08-16 ENCOUNTER — Telehealth: Payer: Self-pay | Admitting: *Deleted

## 2018-08-16 NOTE — Telephone Encounter (Signed)
Patient seen in the office on 08/15/18 and expressed she wanted to leave [facility] and establish an account at [facility]. All of the paperwork needed has been faxed over to [facility] and Francoise Ceo the manager will contact the patient.

## 2018-08-29 DIAGNOSIS — H31012 Macula scars of posterior pole (postinflammatory) (post-traumatic), left eye: Secondary | ICD-10-CM | POA: Diagnosis not present

## 2018-08-29 DIAGNOSIS — H35371 Puckering of macula, right eye: Secondary | ICD-10-CM | POA: Diagnosis not present

## 2018-08-29 DIAGNOSIS — H25811 Combined forms of age-related cataract, right eye: Secondary | ICD-10-CM | POA: Diagnosis not present

## 2018-10-06 DIAGNOSIS — G4733 Obstructive sleep apnea (adult) (pediatric): Secondary | ICD-10-CM | POA: Diagnosis not present

## 2018-10-09 ENCOUNTER — Other Ambulatory Visit: Payer: Self-pay | Admitting: Pharmacist

## 2018-10-09 MED ORDER — APIXABAN 5 MG PO TABS: 5.0000 mg | ORAL_TABLET | Freq: Two times a day (BID) | ORAL | 1 refills | Status: DC

## 2018-10-09 NOTE — Telephone Encounter (Signed)
63yo, 130kg, Scr 1.01 on 05/02/18 Last OV 08/15/18 Indication afib

## 2018-11-05 DIAGNOSIS — G4733 Obstructive sleep apnea (adult) (pediatric): Secondary | ICD-10-CM | POA: Diagnosis not present

## 2018-11-09 ENCOUNTER — Other Ambulatory Visit: Payer: Self-pay | Admitting: Cardiology

## 2018-11-09 MED ORDER — METOPROLOL SUCCINATE ER 50 MG PO TB24: ORAL_TABLET | ORAL | 3 refills | Status: DC

## 2018-12-14 ENCOUNTER — Telehealth: Payer: Self-pay | Admitting: Cardiology

## 2018-12-14 NOTE — Telephone Encounter (Signed)
New Message     Patient c/o Palpitations:  High priority if patient c/o lightheadedness, shortness of breath, or chest pain  1) How long have you had palpitations/irregular HR/ Afib? Are you having the symptoms now? She says she feels it is out of rhythm again, yes symptoms now   2) Are you currently experiencing lightheadedness, SOB or CP? Not really   3) Do you have a history of afib (atrial fibrillation) or irregular heart rhythm? Yes   4) Have you checked your BP or HR? (document readings if available): BP 128/75 HR 72   5) Are you experiencing any other symptoms? Pt feels tired. She would like to see the Dr in office

## 2018-12-14 NOTE — Telephone Encounter (Signed)
Pt reports she had a "weird feeling last night, uncomfortable feeling in my chest".  Reports feeling light-headed the last couple of days.  No other symptoms to report. Denies syncope/SOB/swelling. Pulse avg 70s. Pt sees her OBGYN next Tuesday and will have them check her pulse there.  Pt scheduled for OV next week to discuss further and for EKG evaluation. Advised to call office if issues worsens and/or new symptoms begin prior to OV next week. Patient verbalized understanding and agreeable to plan.

## 2018-12-18 ENCOUNTER — Telehealth: Payer: Self-pay | Admitting: *Deleted

## 2018-12-18 NOTE — Telephone Encounter (Signed)
   Primary Cardiologist: No primary care provider on file.   Pt contacted.  History and symptoms reviewed.  Pt will f/u with [facility] provider as scheduled.  Pt. advised that we are restricting visitors at this time and request that only patients present for check-in prior to their appointment.  All other visitors should remain in their car.  If necessary, only one visitor may come with the patient, into the building. For everyone's safety, all patients and visitor entering our practice area should expect to be screened again prior to entering our waiting area.  Shawnay Bramel  12/18/2018 5:40 PM     COVID-19 Pre-Screening Questions:  . In the past 7 to 10 days have you had a cough,  shortness of breath, headache, congestion, fever (100 or greater) body aches, chills, sore throat, or sudden loss of taste or sense of smell?  no . Have you been around anyone with known Covid 19?  no . Have you been around anyone who is awaiting Covid 19 test results in the past 7 to 10 days?  no . Have you been around anyone who has been exposed to Covid 19, or has mentioned symptoms of Covid 19 within the past 7 to 10 days?  no  If you have any concerns/questions about symptoms patients report during screening (either on the phone or at threshold). Contact the provider seeing the patient or DOD for further guidance.  If neither are available contact a member of the leadership team.

## 2018-12-21 ENCOUNTER — Other Ambulatory Visit: Payer: Self-pay

## 2018-12-21 ENCOUNTER — Encounter: Payer: Self-pay | Admitting: Cardiology

## 2018-12-21 ENCOUNTER — Ambulatory Visit (INDEPENDENT_AMBULATORY_CARE_PROVIDER_SITE_OTHER): Payer: 59 | Admitting: Cardiology

## 2018-12-21 VITALS — BP 126/72 | HR 84 | Ht 73.0 in | Wt 286.0 lb

## 2018-12-21 DIAGNOSIS — I4819 Other persistent atrial fibrillation: Secondary | ICD-10-CM | POA: Diagnosis not present

## 2018-12-21 MED ORDER — PROPAFENONE HCL ER 325 MG PO CP12: 325.0000 mg | ORAL_CAPSULE | Freq: Two times a day (BID) | ORAL | 2 refills | Status: DC

## 2018-12-21 NOTE — Patient Instructions (Addendum)
Medication Instructions:  Your physician has recommended you make the following change in your medication: 1. INCREASE Propafenone to 325 mg twice daily  * If you need a refill on your cardiac medications before your next appointment, please call your pharmacy.   Labwork: None ordered  Testing/Procedures: Your physician has recommended that you have a Cardioversion (DCCV). Electrical Cardioversion uses a jolt of electricity to your heart either through paddles or wired patches attached to your chest. This is a controlled, usually prescheduled, procedure. Defibrillation is done under light anesthesia in the hospital, and you usually go home the day of the procedure. This is done to get your heart back into a normal rhythm. You are not awake for the procedure. Nurse will call you to arrange this procedure.   Follow-Up: Your physician wants you to follow-up in: 6 months with Dr. Curt Bears.  You will receive a reminder letter in the mail two months in advance. If you don't receive a letter, please call our office to schedule the follow-up appointment.  Thank you for choosing [facility]!!   Trinidad Curet, RN [phone]  Any Other Special Instructions Will Be Listed Below (If Applicable).

## 2018-12-21 NOTE — Progress Notes (Signed)
Electrophysiology Office Note   Date:  12/21/2018   ID:  Deborah [name], DOB 1956/05/18, MRN [id number]  PCP:  Gaynelle Arabian, MD  Cardiologist:  Irish Lack Primary Electrophysiologist:  Constance Haw, MD    No chief complaint on file.    History of Present Illness: Deborah [name] is a 63 y.o. female who presents today for electrophysiology evaluation.   She has a history of paroxysmal atrial fibrillation.  She had ablation on 12/04/15. She did well for a while, but did return back to atrial fibrillation after her flecainide was stopped. She was started on flecainide and had cardioversion on 10/01/16. When she presented for cardioversion, she was in sinus rhythm.  He did not tolerate flecainide due to QRS lengthening.  She had a repeat atrial fibrillation ablation 12/2017.  She unfortunately came back into clinic in atrial fibrillation and had a repeat cardioversion.  Today, denies symptoms of palpitations, chest pain, shortness of breath, orthopnea, PND, lower extremity edema, claudication, dizziness, presyncope, syncope, bleeding, or neurologic sequela. The patient is tolerating medications without difficulties.  She was doing well up until last Thursday.  At that point, she became mildly dizzy, weak and fatigued.  She feels that she went back into atrial fibrillation at that time.   Past Medical History:  Diagnosis Date  . A-fib ([place])   . Anemia    hx   . Anxiety   . Arthritis    "was in right knee" (01/25/2018)  . Dysrhythmia    atrial fib/flutter  . GERD (gastroesophageal reflux disease)    occ  . HTN (hypertension)   . Migraine    "used to have them more frequently before menopause; now only have a couple/yr" (01/25/2018)  . Obesity   . OSA on CPAP   . PAF (paroxysmal atrial fibrillation) ([place])    s/p DCCV 2010  . Peripheral vascular disease ([place]) 2016   after flying in leg   Past Surgical History:  Procedure Laterality Date  . ATRIAL FIBRILLATION ABLATION N/A  01/25/2018   Procedure: ATRIAL FIBRILLATION ABLATION;  Surgeon: Constance Haw, MD;  Location: [facility];  Service: Cardiovascular;  Laterality: N/A;  . CARDIAC CATHETERIZATION  2005  . CARDIOVERSION N/A 12/09/2017   Procedure: CARDIOVERSION;  Surgeon: Sanda Klein, MD;  Location: [facility];  Service: Cardiovascular;  Laterality: N/A;  . CARDIOVERSION N/A 05/09/2018   Procedure: CARDIOVERSION;  Surgeon: Josue Hector, MD;  Location: [facility];  Service: Cardiovascular;  Laterality: N/A;  . CATARACT EXTRACTION W/ INTRAOCULAR LENS IMPLANT Left 2016  . ELECTROPHYSIOLOGIC STUDY N/A 12/04/2015   Procedure: Atrial Fibrillation Ablation;  Surgeon: Kamarii Carton Meredith Leeds, MD;  Location: [facility];  Service: Cardiovascular;  Laterality: N/A;  . JOINT REPLACEMENT    . TONSILLECTOMY AND ADENOIDECTOMY    . TOTAL KNEE ARTHROPLASTY Right 04/23/2016   Procedure: TOTAL KNEE ARTHROPLASTY;  Surgeon: [name], MD;  Location: [facility];  Service: Orthopedics;  Laterality: Right;  . TUBAL LIGATION       Current Outpatient Medications  Medication Sig Dispense Refill  . acetaminophen (TYLENOL) 500 MG tablet Take 1,000 mg by mouth every 6 (six) hours as needed for moderate pain or headache.    . ALPRAZolam (XANAX) 0.5 MG tablet Take 0.5 mg by mouth 3 (three) times daily as needed for anxiety.     Marland Kitchen apixaban (ELIQUIS) 5 MG TABS tablet Take 1 tablet (5 mg total) by mouth 2 (two) times daily. 180 tablet 1  .  calcium carbonate (TUMS - DOSED IN MG ELEMENTAL CALCIUM) 500 MG chewable tablet Chew 3 tablets by mouth daily as needed for indigestion or heartburn.     . cyanocobalamin 1000 MCG tablet Take 1,000 mcg by mouth daily.     Marland Kitchen escitalopram (LEXAPRO) 10 MG tablet Take 10 mg by mouth every evening.   3  . metoprolol succinate (TOPROL-XL) 50 MG 24 hr tablet TAKE 1 TABLET BY MOUTH DAILY. TAKE WITH OR IMMEDIATELY FOLLOWING A MEAL. 90 tablet 3   No current facility-administered medications  for this visit.     Allergies:   Benazepril, Adhesive [tape], and Sulfa antibiotics   Social History:  The patient  reports that she has never smoked. She has never used smokeless tobacco. She reports current alcohol use. She reports that she does not use drugs.   Family History:  The patient's family history includes Heart disease in her mother; Stomach cancer in her paternal grandfather.   ROS:  Please see the history of present illness.   Otherwise, review of systems is positive for none.   All other systems are reviewed and negative.   PHYSICAL EXAM: VS:  BP 126/72   Pulse 84   Ht 6\' 1"  (1.854 m)   Wt 286 lb (129.7 kg)   BMI 37.73 kg/m  , BMI Body mass index is 37.73 kg/m. GEN: Well nourished, well developed, in no acute distress  HEENT: normal  Neck: no JVD, carotid bruits, or masses Cardiac: iRRR; no murmurs, rubs, or gallops,no edema  Respiratory:  clear to auscultation bilaterally, normal work of breathing GI: soft, nontender, nondistended, + BS MS: no deformity or atrophy  Skin: warm and dry Neuro:  Strength and sensation are intact Psych: euthymic mood, full affect  EKG:  EKG is ordered today. Personal review of the ekg ordered shows AF, RBBB   Recent Labs: 05/02/2018: BUN 17; Creatinine, Ser 1.01; Hemoglobin 12.1; Platelets 322; Potassium 5.0; Sodium 144    Lipid Panel  No results found for: CHOL, TRIG, HDL, CHOLHDL, VLDL, LDLCALC, LDLDIRECT   Wt Readings from Last 3 Encounters:  12/21/18 286 lb (129.7 kg)  08/15/18 286 lb 12.8 oz (130.1 kg)  08/02/18 282 lb (127.9 kg)      Other studies Reviewed: Additional studies/ records that were reviewed today include: TTE 11/25/15 - Left ventricle: Systolic function was normal. The estimated   ejection fraction was in the range of 50% to 55%. The study is   not technically sufficient to allow evaluation of LV diastolic   function. - Left atrium: The appendage was moderately dilated. - Atrial septum: No defect or  patent foramen ovale was identified.   ASSESSMENT AND PLAN:  1.  Persistent atrial fibrillation: Status post ablation 12/04/2015 with repeat ablation 01/25/2018.  She has been put on propafenone since that time.  Only she is back in atrial fibrillation currently.  We Hanni Milford thus plan for cardioversion.  We Karlita Lichtman increase her propafenone dose to 325 mg twice a day.  This patients CHA2DS2-VASc Score and unadjusted Ischemic Stroke Rate (% per year) is equal to 2.2 % stroke rate/year from a score of 2  Above score calculated as 1 point each if present [CHF, HTN, DM, Vascular=MI/PAD/Aortic Plaque, Age if 65-74, or Female] Above score calculated as 2 points each if present [Age > 75, or Stroke/TIA/TE]   2. Hypertension: Currently well controlled  3.  Obstructive sleep apnea: CPAP compliance encouraged  Current medicines are reviewed at length with the patient today.  The patient does not have concerns regarding her medicines.  The following changes were made today: Increase per path known  Labs/ tests ordered today include:  No orders of the defined types were placed in this encounter.    Disposition:   FU with Drystan Reader 3 months  Signed, Linnie Mcglocklin Meredith Leeds, MD  12/21/2018 3:57 PM     [facility] [address] [place] [address] [phone] (office) [phone] (fax)

## 2018-12-21 NOTE — H&P (View-Only) (Signed)
Electrophysiology Office Note   Date:  12/21/2018   ID:  Deborah [name], DOB Oct 31, 1955, MRN [id number]  PCP:  Gaynelle Arabian, MD  Cardiologist:  Irish Lack Primary Electrophysiologist:  Constance Haw, MD    No chief complaint on file.    History of Present Illness: Deborah [name] is a 63 y.o. female who presents today for electrophysiology evaluation.   She has a history of paroxysmal atrial fibrillation.  She had ablation on 12/04/15. She did well for a while, but did return back to atrial fibrillation after her flecainide was stopped. She was started on flecainide and had cardioversion on 10/01/16. When she presented for cardioversion, she was in sinus rhythm.  He did not tolerate flecainide due to QRS lengthening.  She had a repeat atrial fibrillation ablation 12/2017.  She unfortunately came back into clinic in atrial fibrillation and had a repeat cardioversion.  Today, denies symptoms of palpitations, chest pain, shortness of breath, orthopnea, PND, lower extremity edema, claudication, dizziness, presyncope, syncope, bleeding, or neurologic sequela. The patient is tolerating medications without difficulties.  She was doing well up until last Thursday.  At that point, she became mildly dizzy, weak and fatigued.  She feels that she went back into atrial fibrillation at that time.   Past Medical History:  Diagnosis Date  . A-fib ([place])   . Anemia    hx   . Anxiety   . Arthritis    "was in right knee" (01/25/2018)  . Dysrhythmia    atrial fib/flutter  . GERD (gastroesophageal reflux disease)    occ  . HTN (hypertension)   . Migraine    "used to have them more frequently before menopause; now only have a couple/yr" (01/25/2018)  . Obesity   . OSA on CPAP   . PAF (paroxysmal atrial fibrillation) ([place])    s/p DCCV 2010  . Peripheral vascular disease ([place]) 2016   after flying in leg   Past Surgical History:  Procedure Laterality Date  . ATRIAL FIBRILLATION ABLATION N/A  01/25/2018   Procedure: ATRIAL FIBRILLATION ABLATION;  Surgeon: Constance Haw, MD;  Location: [facility];  Service: Cardiovascular;  Laterality: N/A;  . CARDIAC CATHETERIZATION  2005  . CARDIOVERSION N/A 12/09/2017   Procedure: CARDIOVERSION;  Surgeon: Sanda Klein, MD;  Location: [facility];  Service: Cardiovascular;  Laterality: N/A;  . CARDIOVERSION N/A 05/09/2018   Procedure: CARDIOVERSION;  Surgeon: Josue Hector, MD;  Location: [facility];  Service: Cardiovascular;  Laterality: N/A;  . CATARACT EXTRACTION W/ INTRAOCULAR LENS IMPLANT Left 2016  . ELECTROPHYSIOLOGIC STUDY N/A 12/04/2015   Procedure: Atrial Fibrillation Ablation;  Surgeon: Tyna Huertas Meredith Leeds, MD;  Location: [facility];  Service: Cardiovascular;  Laterality: N/A;  . JOINT REPLACEMENT    . TONSILLECTOMY AND ADENOIDECTOMY    . TOTAL KNEE ARTHROPLASTY Right 04/23/2016   Procedure: TOTAL KNEE ARTHROPLASTY;  Surgeon: [name], MD;  Location: [facility];  Service: Orthopedics;  Laterality: Right;  . TUBAL LIGATION       Current Outpatient Medications  Medication Sig Dispense Refill  . acetaminophen (TYLENOL) 500 MG tablet Take 1,000 mg by mouth every 6 (six) hours as needed for moderate pain or headache.    . ALPRAZolam (XANAX) 0.5 MG tablet Take 0.5 mg by mouth 3 (three) times daily as needed for anxiety.     Marland Kitchen apixaban (ELIQUIS) 5 MG TABS tablet Take 1 tablet (5 mg total) by mouth 2 (two) times daily. 180 tablet 1  .  calcium carbonate (TUMS - DOSED IN MG ELEMENTAL CALCIUM) 500 MG chewable tablet Chew 3 tablets by mouth daily as needed for indigestion or heartburn.     . cyanocobalamin 1000 MCG tablet Take 1,000 mcg by mouth daily.     Marland Kitchen escitalopram (LEXAPRO) 10 MG tablet Take 10 mg by mouth every evening.   3  . metoprolol succinate (TOPROL-XL) 50 MG 24 hr tablet TAKE 1 TABLET BY MOUTH DAILY. TAKE WITH OR IMMEDIATELY FOLLOWING A MEAL. 90 tablet 3   No current facility-administered medications  for this visit.     Allergies:   Benazepril, Adhesive [tape], and Sulfa antibiotics   Social History:  The patient  reports that she has never smoked. She has never used smokeless tobacco. She reports current alcohol use. She reports that she does not use drugs.   Family History:  The patient's family history includes Heart disease in her mother; Stomach cancer in her paternal grandfather.   ROS:  Please see the history of present illness.   Otherwise, review of systems is positive for none.   All other systems are reviewed and negative.   PHYSICAL EXAM: VS:  BP 126/72   Pulse 84   Ht 6\' 1"  (1.854 m)   Wt 286 lb (129.7 kg)   BMI 37.73 kg/m  , BMI Body mass index is 37.73 kg/m. GEN: Well nourished, well developed, in no acute distress  HEENT: normal  Neck: no JVD, carotid bruits, or masses Cardiac: iRRR; no murmurs, rubs, or gallops,no edema  Respiratory:  clear to auscultation bilaterally, normal work of breathing GI: soft, nontender, nondistended, + BS MS: no deformity or atrophy  Skin: warm and dry Neuro:  Strength and sensation are intact Psych: euthymic mood, full affect  EKG:  EKG is ordered today. Personal review of the ekg ordered shows AF, RBBB   Recent Labs: 05/02/2018: BUN 17; Creatinine, Ser 1.01; Hemoglobin 12.1; Platelets 322; Potassium 5.0; Sodium 144    Lipid Panel  No results found for: CHOL, TRIG, HDL, CHOLHDL, VLDL, LDLCALC, LDLDIRECT   Wt Readings from Last 3 Encounters:  12/21/18 286 lb (129.7 kg)  08/15/18 286 lb 12.8 oz (130.1 kg)  08/02/18 282 lb (127.9 kg)      Other studies Reviewed: Additional studies/ records that were reviewed today include: TTE 11/25/15 - Left ventricle: Systolic function was normal. The estimated   ejection fraction was in the range of 50% to 55%. The study is   not technically sufficient to allow evaluation of LV diastolic   function. - Left atrium: The appendage was moderately dilated. - Atrial septum: No defect or  patent foramen ovale was identified.   ASSESSMENT AND PLAN:  1.  Persistent atrial fibrillation: Status post ablation 12/04/2015 with repeat ablation 01/25/2018.  She has been put on propafenone since that time.  Only she is back in atrial fibrillation currently.  We Stevie Charter thus plan for cardioversion.  We Bao Coreas increase her propafenone dose to 325 mg twice a day.  This patients CHA2DS2-VASc Score and unadjusted Ischemic Stroke Rate (% per year) is equal to 2.2 % stroke rate/year from a score of 2  Above score calculated as 1 point each if present [CHF, HTN, DM, Vascular=MI/PAD/Aortic Plaque, Age if 65-74, or Female] Above score calculated as 2 points each if present [Age > 75, or Stroke/TIA/TE]   2. Hypertension: Currently well controlled  3.  Obstructive sleep apnea: CPAP compliance encouraged  Current medicines are reviewed at length with the patient today.  The patient does not have concerns regarding her medicines.  The following changes were made today: Increase per path known  Labs/ tests ordered today include:  No orders of the defined types were placed in this encounter.    Disposition:   FU with Janye Maynor 3 months  Signed, Marquan Vokes Meredith Leeds, MD  12/21/2018 3:57 PM     [facility] [address] [place] [address] [phone] (office) [phone] (fax)

## 2018-12-26 ENCOUNTER — Telehealth: Payer: Self-pay

## 2018-12-26 DIAGNOSIS — I4891 Unspecified atrial fibrillation: Secondary | ICD-10-CM

## 2018-12-26 NOTE — Telephone Encounter (Signed)
LVM for pt to call back and discuss upcoming DCCV. Pt has been scheduled for 7/8 @ 12pm with Dr Acie Fredrickson. COVID screening and lab appt has been made for 7/6.  I will await and/or attempt to call pt at a later time today to discuss pre procedural instructions.

## 2018-12-26 NOTE — Telephone Encounter (Signed)
Pt called back to discuss pre procedure instructions. She has repeated and verbalized understanding of the following. She will call with any additional questions.   You are scheduled for a Cardioversion on July 8 with Dr. Acie Fredrickson.  Please arrive at the [facility] (Main Entrance A) at [facility]: [address] [place], [place] [address] at 10:30am.   DIET: Nothing to eat or drink after midnight except a sip of water with medications (see medication instructions below)  Medication Instructions: You may take your normal medications the morning of your procedure except your Toprol XL.   Continue your anticoagulant: Eliquis. Do not skip a dose.  You will need to continue your anticoagulant after your procedure until you are told by your provider that it is safe to stop  Labs: Please come by our office at [address] on July 6 for blood work. Please stop by before going to [facility] for your COVID screening.  You are scheduled for your COVID-19 screening test on July 6 @ 10am. Please report to [facility] for this screening. You will be directed to a drive up screening area. After the screening is performed, you will be given a mask to take home and expected to adhere to strict quarantine. Please have no visitors in or out of your home, no visiting others, and no visiting retail stores or restaurants from the time of the screening until after your procedure.  You must have a responsible person to drive you home. They will not be allowed to wait in the waiting area. They will be called and directed to pick you up after your procedure is completed. Failure to have a ride home could result in cancellation.  Bring your insurance cards

## 2019-01-01 ENCOUNTER — Other Ambulatory Visit: Payer: 59 | Admitting: *Deleted

## 2019-01-01 ENCOUNTER — Other Ambulatory Visit (HOSPITAL_COMMUNITY)
Admission: RE | Admit: 2019-01-01 | Discharge: 2019-01-01 | Disposition: A | Discharge status: Home / Self Care | Payer: 59 | Source: Ambulatory Visit | Attending: Cardiovascular Disease | Admitting: Cardiovascular Disease

## 2019-01-01 ENCOUNTER — Other Ambulatory Visit: Payer: Self-pay

## 2019-01-01 DIAGNOSIS — Z01812 Encounter for preprocedural laboratory examination: Secondary | ICD-10-CM | POA: Diagnosis not present

## 2019-01-01 DIAGNOSIS — I4891 Unspecified atrial fibrillation: Secondary | ICD-10-CM

## 2019-01-01 DIAGNOSIS — Z1159 Encounter for screening for other viral diseases: Secondary | ICD-10-CM | POA: Diagnosis not present

## 2019-01-01 LAB — BASIC METABOLIC PANEL
BUN/Creatinine Ratio: 11 — ABNORMAL LOW (ref 12–28)
BUN: 12 mg/dL (ref 8–27)
CO2: 25 mmol/L (ref 20–29)
Calcium: 9.2 mg/dL (ref 8.7–10.3)
Chloride: 107 mmol/L — ABNORMAL HIGH (ref 96–106)
Creatinine, Ser: 1.14 mg/dL — ABNORMAL HIGH (ref 0.57–1.00)
GFR calc Af Amer: 59 mL/min/{1.73_m2} — ABNORMAL LOW (ref >59)
GFR calc non Af Amer: 51 mL/min/{1.73_m2} — ABNORMAL LOW (ref >59)
Glucose: 96 mg/dL (ref 65–99)
Potassium: 4.4 mmol/L (ref 3.5–5.2)
Sodium: 142 mmol/L (ref 134–144)

## 2019-01-01 LAB — CBC
Hematocrit: 39.7 % (ref 34.0–46.6)
Hemoglobin: 12.9 g/dL (ref 11.1–15.9)
MCH: 28.2 pg (ref 26.6–33.0)
MCHC: 32.5 g/dL (ref 31.5–35.7)
MCV: 87 fL (ref 79–97)
Platelets: 290 10*3/uL (ref 150–450)
RBC: 4.58 x10E6/uL (ref 3.77–5.28)
RDW: 14.6 % (ref 11.7–15.4)
WBC: 5.8 10*3/uL (ref 3.4–10.8)

## 2019-01-01 LAB — SARS CORONAVIRUS 2 (TAT 6-24 HRS): SARS Coronavirus 2: NEGATIVE

## 2019-01-03 ENCOUNTER — Other Ambulatory Visit: Payer: Self-pay

## 2019-01-03 ENCOUNTER — Encounter (HOSPITAL_COMMUNITY)
Admission: RE | Disposition: A | Discharge status: Home / Self Care | Payer: Self-pay | Source: Home / Self Care | Attending: Cardiovascular Disease

## 2019-01-03 ENCOUNTER — Encounter (HOSPITAL_COMMUNITY): Payer: Self-pay | Admitting: *Deleted

## 2019-01-03 ENCOUNTER — Ambulatory Visit (HOSPITAL_COMMUNITY)
Admission: RE | Admit: 2019-01-03 | Discharge: 2019-01-03 | Disposition: A | Discharge status: Home / Self Care | Payer: 59 | Attending: Cardiovascular Disease | Admitting: Cardiovascular Disease

## 2019-01-03 ENCOUNTER — Ambulatory Visit (HOSPITAL_COMMUNITY): Payer: 59 | Admitting: Certified Registered Nurse Anesthetist

## 2019-01-03 DIAGNOSIS — F419 Anxiety disorder, unspecified: Secondary | ICD-10-CM | POA: Diagnosis not present

## 2019-01-03 DIAGNOSIS — I4819 Other persistent atrial fibrillation: Secondary | ICD-10-CM | POA: Diagnosis present

## 2019-01-03 DIAGNOSIS — Z7901 Long term (current) use of anticoagulants: Secondary | ICD-10-CM | POA: Insufficient documentation

## 2019-01-03 DIAGNOSIS — Z6837 Body mass index (BMI) 37.0-37.9, adult: Secondary | ICD-10-CM | POA: Insufficient documentation

## 2019-01-03 DIAGNOSIS — Z882 Allergy status to sulfonamides status: Secondary | ICD-10-CM | POA: Insufficient documentation

## 2019-01-03 DIAGNOSIS — Z8249 Family history of ischemic heart disease and other diseases of the circulatory system: Secondary | ICD-10-CM | POA: Diagnosis not present

## 2019-01-03 DIAGNOSIS — Z79899 Other long term (current) drug therapy: Secondary | ICD-10-CM | POA: Insufficient documentation

## 2019-01-03 DIAGNOSIS — K219 Gastro-esophageal reflux disease without esophagitis: Secondary | ICD-10-CM | POA: Insufficient documentation

## 2019-01-03 DIAGNOSIS — Z888 Allergy status to other drugs, medicaments and biological substances status: Secondary | ICD-10-CM | POA: Insufficient documentation

## 2019-01-03 DIAGNOSIS — G4733 Obstructive sleep apnea (adult) (pediatric): Secondary | ICD-10-CM | POA: Diagnosis not present

## 2019-01-03 DIAGNOSIS — E669 Obesity, unspecified: Secondary | ICD-10-CM | POA: Insufficient documentation

## 2019-01-03 DIAGNOSIS — E1151 Type 2 diabetes mellitus with diabetic peripheral angiopathy without gangrene: Secondary | ICD-10-CM | POA: Diagnosis not present

## 2019-01-03 DIAGNOSIS — M1990 Unspecified osteoarthritis, unspecified site: Secondary | ICD-10-CM | POA: Diagnosis not present

## 2019-01-03 DIAGNOSIS — Z9851 Tubal ligation status: Secondary | ICD-10-CM | POA: Insufficient documentation

## 2019-01-03 DIAGNOSIS — I4892 Unspecified atrial flutter: Secondary | ICD-10-CM | POA: Insufficient documentation

## 2019-01-03 DIAGNOSIS — I10 Essential (primary) hypertension: Secondary | ICD-10-CM | POA: Diagnosis not present

## 2019-01-03 DIAGNOSIS — Z96651 Presence of right artificial knee joint: Secondary | ICD-10-CM | POA: Diagnosis not present

## 2019-01-03 HISTORY — PX: CARDIOVERSION: SHX1299

## 2019-01-03 SURGERY — CARDIOVERSION
Anesthesia: General

## 2019-01-03 MED ORDER — SODIUM CHLORIDE 0.9 % IV SOLN
INTRAVENOUS | Status: DC
  Administered 2019-01-03: 500 mL via INTRAVENOUS

## 2019-01-03 MED ORDER — PROPOFOL 10 MG/ML IV BOLUS
INTRAVENOUS | Status: DC | PRN
  Administered 2019-01-03: 120 mg via INTRAVENOUS

## 2019-01-03 MED ORDER — LIDOCAINE 2% (20 MG/ML) 5 ML SYRINGE
INTRAMUSCULAR | Status: DC | PRN
  Administered 2019-01-03: 20 mg via INTRAVENOUS

## 2019-01-03 NOTE — Anesthesia Preprocedure Evaluation (Signed)
Anesthesia Evaluation  Patient identified by MRN, date of birth, ID band Patient awake    Reviewed: Allergy & Precautions, NPO status , Patient's Chart, lab work & pertinent test results  Airway Mallampati: II       Dental   Pulmonary    breath sounds clear to auscultation       Cardiovascular hypertension, + Peripheral Vascular Disease  + dysrhythmias  Rhythm:Irregular Rate:Normal     Neuro/Psych    GI/Hepatic   Endo/Other    Renal/GU      Musculoskeletal   Abdominal   Peds  Hematology   Anesthesia Other Findings   Reproductive/Obstetrics                             Anesthesia Physical Anesthesia Plan  ASA: III  Anesthesia Plan: General   Post-op Pain Management:    Induction: Intravenous  PONV Risk Score and Plan: Treatment may vary due to age or medical condition  Airway Management Planned: Natural Airway and Mask  Additional Equipment:   Intra-op Plan:   Post-operative Plan:   Informed Consent: I have reviewed the patients History and Physical, chart, labs and discussed the procedure including the risks, benefits and alternatives for the proposed anesthesia with the patient or authorized representative who has indicated his/her understanding and acceptance.       Plan Discussed with:   Anesthesia Plan Comments:         Anesthesia Quick Evaluation

## 2019-01-03 NOTE — Interval H&P Note (Signed)
History and Physical Interval Note:  01/03/2019 11:23 AM  Deborah [name]  has presented today for surgery, with the diagnosis of ATRIAL FIBRILLATION.  The various methods of treatment have been discussed with the patient and family. After consideration of risks, benefits and other options for treatment, the patient has consented to  Procedure(s): CARDIOVERSION (N/A) as a surgical intervention.  The patient's history has been reviewed, patient examined, no change in status, stable for surgery.  I have reviewed the patient's chart and labs.  Questions were answered to the patient's satisfaction.     Mertie Moores

## 2019-01-03 NOTE — CV Procedure (Signed)
    Cardioversion Note  Deborah [name] [id number] 01-11-1956  Procedure: DC Cardioversion Indications:  Atrial fib   Procedure Details Consent: Obtained Time Out: Verified patient identification, verified procedure, site/side was marked, verified correct patient position, special equipment/implants available, Radiology Safety Procedures followed,  medications/allergies/relevent history reviewed, required imaging and test results available.  Performed  The patient has been on adequate anticoagulation.  The patient received IV Lidocaine 20 mg IV followed by Propofol 120 mg IV  for sedation.  Synchronous cardioversion was performed at  200  joules.  The cardioversion was successful.     Complications: No apparent complications Patient did tolerate procedure well.   Thayer Headings, Brooke Bonito., MD, [facility], [place] 01/03/2019, 11:38 AM

## 2019-01-03 NOTE — Anesthesia Postprocedure Evaluation (Signed)
Anesthesia Post Note  Patient: Deborah [name]  Procedure(s) Performed: CARDIOVERSION (N/A )     Patient location during evaluation: PACU Anesthesia Type: General Level of consciousness: awake and alert Pain management: pain level controlled Vital Signs Assessment: post-procedure vital signs reviewed and stable Respiratory status: spontaneous breathing, nonlabored ventilation, respiratory function stable and patient connected to nasal cannula oxygen Cardiovascular status: blood pressure returned to baseline and stable Postop Assessment: no apparent nausea or vomiting Anesthetic complications: no    Last Vitals:  Vitals:   01/03/19 1150 01/03/19 1157  BP: 138/69   Pulse: 62 63  Resp: 14 15  Temp:    SpO2: 96% 97%    Last Pain:  Vitals:   01/03/19 1157  TempSrc:   PainSc: 0-No pain                 Tiajuana Amass

## 2019-01-03 NOTE — Transfer of Care (Signed)
Immediate Anesthesia Transfer of Care Note  Patient: Deborah [name]  Procedure(s) Performed: CARDIOVERSION (N/A )  Patient Location: Endoscopy Unit  Anesthesia Type:General  Level of Consciousness: awake  Airway & Oxygen Therapy: Patient Spontanous Breathing  Post-op Assessment: Report given to RN and Post -op Vital signs reviewed and stable  Post vital signs: Reviewed and stable  Last Vitals:  Vitals Value Taken Time  BP    Temp    Pulse 60 01/03/19 1141  Resp 21 01/03/19 1141  SpO2 97 % 01/03/19 1141  Vitals shown include unvalidated device data.  Last Pain:  Vitals:   01/03/19 1112  TempSrc: Oral  PainSc: 0-No pain         Complications: No apparent anesthesia complications

## 2019-01-03 NOTE — Discharge Instructions (Signed)

## 2019-01-31 ENCOUNTER — Ambulatory Visit (HOSPITAL_COMMUNITY): Payer: 59 | Admitting: Nurse Practitioner

## 2019-02-08 DIAGNOSIS — G4733 Obstructive sleep apnea (adult) (pediatric): Secondary | ICD-10-CM | POA: Diagnosis not present

## 2019-02-08 DIAGNOSIS — I480 Paroxysmal atrial fibrillation: Secondary | ICD-10-CM | POA: Diagnosis not present

## 2019-02-08 DIAGNOSIS — I10 Essential (primary) hypertension: Secondary | ICD-10-CM | POA: Diagnosis not present

## 2019-02-08 DIAGNOSIS — F411 Generalized anxiety disorder: Secondary | ICD-10-CM | POA: Diagnosis not present

## 2019-02-15 ENCOUNTER — Ambulatory Visit (HOSPITAL_COMMUNITY)
Admission: RE | Admit: 2019-02-15 | Discharge: 2019-02-15 | Disposition: A | Discharge status: Home / Self Care | Payer: BC Managed Care – PPO | Source: Ambulatory Visit | Attending: Nurse Practitioner | Admitting: Nurse Practitioner

## 2019-02-15 ENCOUNTER — Other Ambulatory Visit: Payer: Self-pay

## 2019-02-15 ENCOUNTER — Encounter (HOSPITAL_COMMUNITY): Payer: Self-pay | Admitting: Nurse Practitioner

## 2019-02-15 VITALS — BP 128/80 | HR 60 | Ht 73.0 in | Wt 289.0 lb

## 2019-02-15 DIAGNOSIS — Z96651 Presence of right artificial knee joint: Secondary | ICD-10-CM | POA: Diagnosis not present

## 2019-02-15 DIAGNOSIS — I10 Essential (primary) hypertension: Secondary | ICD-10-CM | POA: Insufficient documentation

## 2019-02-15 DIAGNOSIS — Z8249 Family history of ischemic heart disease and other diseases of the circulatory system: Secondary | ICD-10-CM | POA: Insufficient documentation

## 2019-02-15 DIAGNOSIS — Z882 Allergy status to sulfonamides status: Secondary | ICD-10-CM | POA: Diagnosis not present

## 2019-02-15 DIAGNOSIS — Z7901 Long term (current) use of anticoagulants: Secondary | ICD-10-CM | POA: Diagnosis not present

## 2019-02-15 DIAGNOSIS — I4819 Other persistent atrial fibrillation: Secondary | ICD-10-CM | POA: Diagnosis not present

## 2019-02-15 DIAGNOSIS — G4733 Obstructive sleep apnea (adult) (pediatric): Secondary | ICD-10-CM | POA: Insufficient documentation

## 2019-02-15 DIAGNOSIS — Z79899 Other long term (current) drug therapy: Secondary | ICD-10-CM | POA: Insufficient documentation

## 2019-02-15 DIAGNOSIS — E669 Obesity, unspecified: Secondary | ICD-10-CM | POA: Insufficient documentation

## 2019-02-15 DIAGNOSIS — F419 Anxiety disorder, unspecified: Secondary | ICD-10-CM | POA: Diagnosis not present

## 2019-02-15 DIAGNOSIS — Z888 Allergy status to other drugs, medicaments and biological substances status: Secondary | ICD-10-CM | POA: Diagnosis not present

## 2019-02-15 DIAGNOSIS — K219 Gastro-esophageal reflux disease without esophagitis: Secondary | ICD-10-CM | POA: Diagnosis not present

## 2019-02-15 DIAGNOSIS — Z6838 Body mass index (BMI) 38.0-38.9, adult: Secondary | ICD-10-CM | POA: Insufficient documentation

## 2019-02-15 NOTE — Progress Notes (Signed)
Laurena Bering   Primary Care Physician: Gaynelle Arabian, MD Referring Physician: Dr. Gloris Ham Deborah [name] is a 63 y.o. female with a h/o persistent afib that is in the afib clinic for f/u. She has had 2 ablations in the past and several cardioversion's in the last year. Most recent cardioversion was 01/03/19 and she remiains in SR. Her propafenone was increased to 325 mg bid prior to last CV. She reports that she has not noted any afib since the procedure. She feels well.  Today, she denies symptoms of palpitations, chest pain, shortness of breath, orthopnea, PND, lower extremity edema, dizziness, presyncope, syncope, or neurologic sequela. The patient is tolerating medications without difficulties and is otherwise without complaint today.   Past Medical History:  Diagnosis Date  . A-fib ([place])   . Anemia    hx   . Anxiety   . Arthritis    "was in right knee" (01/25/2018)  . Dysrhythmia    atrial fib/flutter  . GERD (gastroesophageal reflux disease)    occ  . HTN (hypertension)   . Migraine    "used to have them more frequently before menopause; now only have a couple/yr" (01/25/2018)  . Obesity   . OSA on CPAP   . PAF (paroxysmal atrial fibrillation) ([place])    s/p DCCV 2010  . Peripheral vascular disease ([place]) 2016   after flying in leg   Past Surgical History:  Procedure Laterality Date  . ATRIAL FIBRILLATION ABLATION N/A 01/25/2018   Procedure: ATRIAL FIBRILLATION ABLATION;  Surgeon: Constance Haw, MD;  Location: [facility];  Service: Cardiovascular;  Laterality: N/A;  . CARDIAC CATHETERIZATION  2005  . CARDIOVERSION N/A 12/09/2017   Procedure: CARDIOVERSION;  Surgeon: Sanda Klein, MD;  Location: [facility];  Service: Cardiovascular;  Laterality: N/A;  . CARDIOVERSION N/A 05/09/2018   Procedure: CARDIOVERSION;  Surgeon: Josue Hector, MD;  Location: [facility];  Service: Cardiovascular;  Laterality: N/A;  . CARDIOVERSION N/A 01/03/2019   Procedure: CARDIOVERSION;   Surgeon: Acie Fredrickson Wonda Cheng, MD;  Location: [facility];  Service: Cardiovascular;  Laterality: N/A;  . CATARACT EXTRACTION W/ INTRAOCULAR LENS IMPLANT Left 2016  . ELECTROPHYSIOLOGIC STUDY N/A 12/04/2015   Procedure: Atrial Fibrillation Ablation;  Surgeon: Will Meredith Leeds, MD;  Location: [facility];  Service: Cardiovascular;  Laterality: N/A;  . JOINT REPLACEMENT    . TONSILLECTOMY AND ADENOIDECTOMY    . TOTAL KNEE ARTHROPLASTY Right 04/23/2016   Procedure: TOTAL KNEE ARTHROPLASTY;  Surgeon: [name], MD;  Location: [facility];  Service: Orthopedics;  Laterality: Right;  . TUBAL LIGATION      Current Outpatient Medications  Medication Sig Dispense Refill  . acetaminophen (TYLENOL) 500 MG tablet Take 1,000 mg by mouth every 6 (six) hours as needed for moderate pain or headache.    . ALPRAZolam (XANAX) 0.5 MG tablet Take 0.5 mg by mouth 3 (three) times daily as needed for anxiety.     Marland Kitchen apixaban (ELIQUIS) 5 MG TABS tablet Take 1 tablet (5 mg total) by mouth 2 (two) times daily. 180 tablet 1  . calcium carbonate (TUMS - DOSED IN MG ELEMENTAL CALCIUM) 500 MG chewable tablet Chew 3 tablets by mouth daily as needed for indigestion or heartburn.     . escitalopram (LEXAPRO) 10 MG tablet Take 10 mg by mouth every evening.   3  . metoprolol succinate (TOPROL-XL) 50 MG 24 hr tablet TAKE 1 TABLET BY MOUTH DAILY. TAKE WITH OR IMMEDIATELY FOLLOWING A MEAL. (Patient  taking differently: Take 50 mg by mouth daily. TAKE 1 TABLET BY MOUTH DAILY. TAKE WITH OR IMMEDIATELY FOLLOWING A MEAL.) 90 tablet 3  . propafenone (RYTHMOL SR) 325 MG 12 hr capsule Take 1 capsule (325 mg total) by mouth 2 (two) times daily. 180 capsule 2  . vitamin B-12 (CYANOCOBALAMIN) 1000 MCG tablet Take 1,000 mcg by mouth daily.     No current facility-administered medications for this encounter.     Allergies  Allergen Reactions  . Benazepril Cough  . Adhesive [Tape] Rash and Other (See Comments)    bandaides  . Sulfa  Antibiotics Rash    Social History   Socioeconomic History  . Marital status: Married    Spouse name: Not on file  . Number of children: Not on file  . Years of education: Not on file  . Highest education level: Not on file  Occupational History  . Not on file  Social Needs  . Financial resource strain: Not on file  . Food insecurity    Worry: Not on file    Inability: Not on file  . Transportation needs    Medical: Not on file    Non-medical: Not on file  Tobacco Use  . Smoking status: Never Smoker  . Smokeless tobacco: Never Used  Substance and Sexual Activity  . Alcohol use: Yes    Alcohol/week: 0.0 standard drinks    Comment: 01/25/2018 "couple drinks/month; if that"  . Drug use: Never  . Sexual activity: Yes  Lifestyle  . Physical activity    Days per week: Not on file    Minutes per session: Not on file  . Stress: Not on file  Relationships  . Social Herbalist on phone: Not on file    Gets together: Not on file    Attends religious service: Not on file    Active member of club or organization: Not on file    Attends meetings of clubs or organizations: Not on file    Relationship status: Not on file  . Intimate partner violence    Fear of current or ex partner: Not on file    Emotionally abused: Not on file    Physically abused: Not on file    Forced sexual activity: Not on file  Other Topics Concern  . Not on file  Social History Narrative  . Not on file    Family History  Problem Relation Age of Onset  . Heart disease Mother   . Stomach cancer Paternal Grandfather   . Colon cancer Neg Hx     ROS- All systems are reviewed and negative except as per the HPI above  Physical Exam: Vitals:   02/15/19 1409  BP: 128/80  Pulse: 60  Weight: 131.1 kg  Height: 6\' 1"  (1.854 m)   Wt Readings from Last 3 Encounters:  02/15/19 131.1 kg  12/21/18 129.7 kg  08/15/18 130.1 kg    Labs: Lab Results  Component Value Date   NA 142 01/01/2019    K 4.4 01/01/2019   CL 107 (H) 01/01/2019   CO2 25 01/01/2019   GLUCOSE 96 01/01/2019   BUN 12 01/01/2019   CREATININE 1.14 (H) 01/01/2019   CALCIUM 9.2 01/01/2019   Lab Results  Component Value Date   INR WILL FOLLOW 09/23/2016   INR 1.0 09/23/2016   No results found for: CHOL, HDL, LDLCALC, TRIG   GEN- The patient is well appearing, alert and oriented x 3 today.  Head- normocephalic, atraumatic Eyes-  Sclera clear, conjunctiva pink Ears- hearing intact Oropharynx- clear Neck- supple, no JVP Lymph- no cervical lymphadenopathy Lungs- Clear to ausculation bilaterally, normal work of breathing Heart- Regular rate and rhythm, no murmurs, rubs or gallops, PMI not laterally displaced GI- soft, NT, ND, + BS Extremities- no clubbing, cyanosis, or edema MS- no significant deformity or atrophy Skin- no rash or lesion Psych- euthymic mood, full affect Neuro- strength and sensation are intact  EKG-NSR with RBBB, pr int 194 bpm, qrs int 172 ms, qtc 460 ms Epic records reviewed    Assessment and Plan: 1. Persistent afib NSR since last cardioversion 01/03/19  Continue metoprolol succinate  50 mg daily  Continue propafenone 325 mg bid  2. Chadsvasc score of 2 Continue eliquis 5 mg bid   F/u with Dr. Curt Bears  per recall in December  Deborah [name], [facility] [address] [place], [place] [address] [phone]

## 2019-02-17 ENCOUNTER — Other Ambulatory Visit: Payer: Self-pay | Admitting: Cardiology

## 2019-02-20 NOTE — Addendum Note (Signed)
Encounter addended by: Sherran Needs, NP on: 02/20/2019 8:48 AM  Actions taken: Level of Service modified

## 2019-02-27 DIAGNOSIS — G4733 Obstructive sleep apnea (adult) (pediatric): Secondary | ICD-10-CM | POA: Diagnosis not present

## 2019-03-09 ENCOUNTER — Other Ambulatory Visit: Payer: Self-pay | Admitting: Cardiology

## 2019-03-16 DIAGNOSIS — G4733 Obstructive sleep apnea (adult) (pediatric): Secondary | ICD-10-CM | POA: Diagnosis not present

## 2019-03-29 DIAGNOSIS — G4733 Obstructive sleep apnea (adult) (pediatric): Secondary | ICD-10-CM | POA: Diagnosis not present

## 2019-04-01 ENCOUNTER — Other Ambulatory Visit: Payer: Self-pay | Admitting: Cardiology

## 2019-04-02 NOTE — Telephone Encounter (Signed)
Prescription refill request for Eliquis received.  Last office visit: Kayleen Memos (02-15-2019) Scr: 1.14 (01-01-2019) Age: 63 y.o. Weight: 129.7 kg   Prescription refill sent.

## 2019-04-29 DIAGNOSIS — G4733 Obstructive sleep apnea (adult) (pediatric): Secondary | ICD-10-CM | POA: Diagnosis not present

## 2019-07-10 ENCOUNTER — Telehealth: Payer: Self-pay | Admitting: Cardiology

## 2019-07-10 NOTE — Telephone Encounter (Signed)
New Message:    Pt wanted to check with the nurse to see if she thought it would be alright to delay her appointment with Dr Curt Bears until COVID rates decline?

## 2019-07-10 NOTE — Telephone Encounter (Signed)
Pt asking if it is ok to wait to "come in to the office" until Covid pandemic has improved.  States she has barely left the house since it began, and especially now during the surge. Pt reports doing well since last being seen in August, no issues to report. Pt aware Dr. Curt Bears will probably be agreeable to a "virtual visit" to ensure pt stable to wait for in-person follow up. Pt aware I will discuss w/ Dr. Curt Bears and let her know by next week. Patient verbalized understanding and agreeable to plan.

## 2019-07-11 NOTE — Telephone Encounter (Signed)
OK for virtual visit.

## 2019-07-11 NOTE — Telephone Encounter (Signed)
Pt informed ok for virtual appt. Aware office will call to arrange virtual visit w/ [place]. Patient verbalized understanding and agreeable to plan.

## 2019-07-16 NOTE — Telephone Encounter (Signed)
Pt scheduled for 1/26

## 2019-07-17 DIAGNOSIS — H26492 Other secondary cataract, left eye: Secondary | ICD-10-CM | POA: Diagnosis not present

## 2019-07-17 DIAGNOSIS — H3581 Retinal edema: Secondary | ICD-10-CM | POA: Diagnosis not present

## 2019-07-17 DIAGNOSIS — Z961 Presence of intraocular lens: Secondary | ICD-10-CM | POA: Diagnosis not present

## 2019-07-17 DIAGNOSIS — H318 Other specified disorders of choroid: Secondary | ICD-10-CM | POA: Diagnosis not present

## 2019-07-17 DIAGNOSIS — H35052 Retinal neovascularization, unspecified, left eye: Secondary | ICD-10-CM | POA: Diagnosis not present

## 2019-07-17 DIAGNOSIS — H2511 Age-related nuclear cataract, right eye: Secondary | ICD-10-CM | POA: Diagnosis not present

## 2019-07-18 DIAGNOSIS — G4733 Obstructive sleep apnea (adult) (pediatric): Secondary | ICD-10-CM | POA: Diagnosis not present

## 2019-07-24 ENCOUNTER — Encounter: Payer: Self-pay | Admitting: Cardiology

## 2019-07-24 ENCOUNTER — Other Ambulatory Visit: Payer: Self-pay

## 2019-07-24 ENCOUNTER — Telehealth (INDEPENDENT_AMBULATORY_CARE_PROVIDER_SITE_OTHER): Payer: PPO | Admitting: Cardiology

## 2019-07-24 DIAGNOSIS — I480 Paroxysmal atrial fibrillation: Secondary | ICD-10-CM | POA: Diagnosis not present

## 2019-07-24 NOTE — Progress Notes (Signed)
Electrophysiology TeleHealth Note   Due to national recommendations of social distancing due to COVID 19, an audio/video telehealth visit is felt to be most appropriate for this patient at this time.  See Epic message for the patient's consent to telehealth for [facility].   Date:  07/24/2019   ID:  Deborah [name], DOB [birth year], MRN [id number]  Location: patient's home  Provider location: [address] [place]  Evaluation Performed: Follow-up visit  PCP:  Gaynelle Arabian, MD  Cardiologist:   Electrophysiologist:  Dr Curt Bears  Chief Complaint:  AF  History of Present Illness:    Deborah [name] is a 64 y.o. female who presents via audio/video conferencing for a telehealth visit today.  Since last being seen in our clinic, the patient reports doing very well.  Today, she denies symptoms of palpitations, chest pain, shortness of breath,  lower extremity edema, dizziness, presyncope, or syncope.  The patient is otherwise without complaint today.  The patient denies symptoms of fevers, chills, cough, or new SOB worrisome for COVID 19.  She has a history significant for persistent atrial fibrillation.  She has had 2 ablations and several cardioversions in the past.  Her most recent cardioversion was 01/03/2019.  Her propafenone was increased at that time.  Today, denies symptoms of palpitations, chest pain, shortness of breath, orthopnea, PND, lower extremity edema, claudication, dizziness, presyncope, syncope, bleeding, or neurologic sequela. The patient is tolerating medications without difficulties.  She currently feels well.  Since her propafenone dose was increased and she had a cardioversion, she has had no further episodes of atrial fibrillation.  She has been able to do all her daily activities without restriction.  Past Medical History:  Diagnosis Date  . A-fib ([place])   . Anemia    hx   . Anxiety   . Arthritis    "was in right knee" (01/25/2018)  . Dysrhythmia      atrial fib/flutter  . GERD (gastroesophageal reflux disease)    occ  . HTN (hypertension)   . Migraine    "used to have them more frequently before menopause; now only have a couple/yr" (01/25/2018)  . Obesity   . OSA on CPAP   . PAF (paroxysmal atrial fibrillation) ([place])    s/p DCCV 2010  . Peripheral vascular disease ([place]) 2016   after flying in leg    Past Surgical History:  Procedure Laterality Date  . ATRIAL FIBRILLATION ABLATION N/A 01/25/2018   Procedure: ATRIAL FIBRILLATION ABLATION;  Surgeon: Constance Haw, MD;  Location: [facility];  Service: Cardiovascular;  Laterality: N/A;  . CARDIAC CATHETERIZATION  2005  . CARDIOVERSION N/A 12/09/2017   Procedure: CARDIOVERSION;  Surgeon: Sanda Klein, MD;  Location: [facility]COPY;  Service: Cardiovascular;  Laterality: N/A;  . CARDIOVERSION N/A 05/09/2018   Procedure: CARDIOVERSION;  Surgeon: Josue Hector, MD;  Location: [facility] ENDOSCOPY;  Service: Cardiovascular;  Laterality: N/A;  . CARDIOVERSION N/A 01/03/2019   Procedure: CARDIOVERSION;  Surgeon: Acie Fredrickson Wonda Cheng, MD;  Location: [facility];  Service: Cardiovascular;  Laterality: N/A;  . CATARACT EXTRACTION W/ INTRAOCULAR LENS IMPLANT Left 2016  . ELECTROPHYSIOLOGIC STUDY N/A 12/04/2015   Procedure: Atrial Fibrillation Ablation;  Surgeon: Arshdeep Bolger Meredith Leeds, MD;  Location: [facility];  Service: Cardiovascular;  Laterality: N/A;  . JOINT REPLACEMENT    . TONSILLECTOMY AND ADENOIDECTOMY    . TOTAL KNEE ARTHROPLASTY Right 04/23/2016   Procedure: TOTAL KNEE ARTHROPLASTY;  Surgeon: [name], MD;  Location: [facility];  Service: Orthopedics;  Laterality: Right;  . TUBAL LIGATION      Current Outpatient Medications  Medication Sig Dispense Refill  . acetaminophen (TYLENOL) 500 MG tablet Take 1,000 mg by mouth every 6 (six) hours as needed for moderate pain or headache.    . ALPRAZolam (XANAX) 0.5 MG tablet Take 0.5 mg by mouth 3 (three) times daily as needed  for anxiety.     . calcium carbonate (TUMS - DOSED IN MG ELEMENTAL CALCIUM) 500 MG chewable tablet Chew 3 tablets by mouth daily as needed for indigestion or heartburn.     Marland Kitchen ELIQUIS 5 MG TABS tablet TAKE 1 TABLET BY MOUTH TWICE A DAY 180 tablet 1  . escitalopram (LEXAPRO) 10 MG tablet Take 10 mg by mouth every evening.   3  . metoprolol succinate (TOPROL-XL) 50 MG 24 hr tablet TAKE 1 TABLET BY MOUTH DAILY. TAKE WITH OR IMMEDIATELY FOLLOWING A MEAL. 90 tablet 3  . propafenone (RYTHMOL SR) 325 MG 12 hr capsule Take 1 capsule (325 mg total) by mouth 2 (two) times daily. 180 capsule 2  . vitamin B-12 (CYANOCOBALAMIN) 1000 MCG tablet Take 1,000 mcg by mouth daily.     No current facility-administered medications for this visit.    Allergies:   Benazepril, Adhesive [tape], and Sulfa antibiotics   Social History:  The patient  reports that she has never smoked. She has never used smokeless tobacco. She reports current alcohol use. She reports that she does not use drugs.   Family History:  The patient's  family history includes Heart disease in her mother; Stomach cancer in her paternal grandfather.   ROS:  Please see the history of present illness.   All other systems are personally reviewed and negative.    Exam:    Vital Signs:  BP 131/72   Pulse 60   Ht 6\' 1"  (1.854 m)   Wt 289 lb (131.1 kg)   BMI 38.13 kg/m   no acute distress, no shortness of breath.  Labs/Other Tests and Data Reviewed:    Recent Labs: 01/01/2019: BUN 12; Creatinine, Ser 1.14; Hemoglobin 12.9; Platelets 290; Potassium 4.4; Sodium 142   Wt Readings from Last 3 Encounters:  07/24/19 289 lb (131.1 kg)  02/15/19 289 lb (131.1 kg)  12/21/18 286 lb (129.7 kg)     Other studies personally reviewed: Additional studies/ records that were reviewed today include: ECG 02/15/19  Review of the above records today demonstrates:  SR, RBBB, rate 61   ASSESSMENT & PLAN:    1.  Persistent atrial fibrillation: Currently on  Eliquis, metoprolol, propafenone.  She currently feels well.  She has had no further episodes of atrial fibrillation since her cardioversion and increased dose of propafenone.   COVID 19 screen The patient denies symptoms of COVID 19 at this time.  The importance of social distancing was discussed today.  Follow-up: 6 months  Current medicines are reviewed at length with the patient today.   The patient does not have concerns regarding her medicines.  The following changes were made today:  none  Labs/ tests ordered today include:  No orders of the defined types were placed in this encounter.    Patient Risk:  after full review of this patients clinical status, I feel that they are at moderate risk at this time.  Today, I have spent 8 minutes with the patient with telehealth technology discussing atrial fibrillation.    Signed, Brinklee Cisse Meredith Leeds, MD  07/24/2019  4:21 PM     [address] [place] [address] [phone] (office) [phone] (fax)

## 2019-07-24 NOTE — Telephone Encounter (Signed)
Virtual Visit Pre-Appointment Phone Call  "(Name), I am calling you today to discuss your upcoming appointment. We are currently trying to limit exposure to the virus that causes COVID-19 by seeing patients at home rather than in the office."  1. "What is the BEST phone number to call the day of the visit?" - include this in appointment notes  2. "Do you have or have access to (through a family member/friend) a smartphone with video capability that we can use for your visit?" a. If yes - list this number in appt notes as "cell" (if different from BEST phone #) and list the appointment type as a VIDEO visit in appointment notes b. If no - list the appointment type as a PHONE visit in appointment notes  3. Confirm consent - "In the setting of the current Covid19 crisis, you are scheduled for a (phone or video) visit with your provider on (date) at (time).  Just as we do with many in-office visits, in order for you to participate in this visit, we must obtain consent.  If you'd like, I can send this to your mychart (if signed up) or email for you to review.  Otherwise, I can obtain your verbal consent now.  All virtual visits are billed to your insurance company just like a normal visit would be.  By agreeing to a virtual visit, we'd like you to understand that the technology does not allow for your provider to perform an examination, and thus may limit your provider's ability to fully assess your condition. If your provider identifies any concerns that need to be evaluated in person, we will make arrangements to do so.  Finally, though the technology is pretty good, we cannot assure that it will always work on either your or our end, and in the setting of a video visit, we may have to convert it to a phone-only visit.  In either situation, we cannot ensure that we have a secure connection.  Are you willing to proceed?" STAFF: Did the patient verbally acknowledge consent to telehealth visit? Document  YES/NO here: YES  4. Advise patient to be prepared - "Two hours prior to your appointment, go ahead and check your blood pressure, pulse, oxygen saturation, and your weight (if you have the equipment to check those) and write them all down. When your visit starts, your provider will ask you for this information. If you have an Apple Watch or Kardia device, please plan to have heart rate information ready on the day of your appointment. Please have a pen and paper handy nearby the day of the visit as well."  5. Give patient instructions for MyChart download to smartphone OR [email or web address] as below if video visit (depending on what platform provider is using)  6. Inform patient they will receive a phone call 15 minutes prior to their appointment time (may be from unknown caller ID) so they should be prepared to answer    TELEPHONE CALL NOTE  Deborah [name] has been deemed a candidate for a follow-up tele-health visit to limit community exposure during the Covid-19 pandemic. I spoke with the patient via phone to ensure availability of phone/video source, confirm preferred email & phone number, and discuss instructions and expectations.  I reminded Deborah [name] to be prepared with any vital sign and/or heart rhythm information that could potentially be obtained via home monitoring, at the time of her visit. I reminded Deborah [name] to expect a phone call prior to  her visit.  Deborah Kidney, RN 07/24/2019 4:51 PM  IF USING DOXIMITY or DOXY.ME - The patient will receive a link just prior to their visit by text.     FULL LENGTH CONSENT FOR TELE-HEALTH VISIT   I hereby voluntarily request, consent and authorize [facility] and its employed or contracted physicians, physician assistants, nurse practitioners or other licensed health care professionals (the Practitioner), to provide me with telemedicine health care services (the "Services") as deemed necessary by the treating Practitioner. I  acknowledge and consent to receive the Services by the Practitioner via telemedicine. I understand that the telemedicine visit will involve communicating with the Practitioner through live audiovisual communication technology and the disclosure of certain medical information by electronic transmission. I acknowledge that I have been given the opportunity to request an in-person assessment or other available alternative prior to the telemedicine visit and am voluntarily participating in the telemedicine visit.  I understand that I have the right to withhold or withdraw my consent to the use of telemedicine in the course of my care at any time, without affecting my right to future care or treatment, and that the Practitioner or I may terminate the telemedicine visit at any time. I understand that I have the right to inspect all information obtained and/or recorded in the course of the telemedicine visit and may receive copies of available information for a reasonable fee.  I understand that some of the potential risks of receiving the Services via telemedicine include:  Marland Kitchen Delay or interruption in medical evaluation due to technological equipment failure or disruption; . Information transmitted may not be sufficient (e.g. poor resolution of images) to allow for appropriate medical decision making by the Practitioner; and/or  . In rare instances, security protocols could fail, causing a breach of personal health information.  Furthermore, I acknowledge that it is my responsibility to provide information about my medical history, conditions and care that is complete and accurate to the best of my ability. I acknowledge that Practitioner's advice, recommendations, and/or decision may be based on factors not within their control, such as incomplete or inaccurate data provided by me or distortions of diagnostic images or specimens that may result from electronic transmissions. I understand that the practice of  medicine is not an exact science and that Practitioner makes no warranties or guarantees regarding treatment outcomes. I acknowledge that I will receive a copy of this consent concurrently upon execution via email to the email address I last provided but may also request a printed copy by calling the office of [facility].    I understand that my insurance will be billed for this visit.   I have read or had this consent read to me. . I understand the contents of this consent, which adequately explains the benefits and risks of the Services being provided via telemedicine.  . I have been provided ample opportunity to ask questions regarding this consent and the Services and have had my questions answered to my satisfaction. . I give my informed consent for the services to be provided through the use of telemedicine in my medical care  By participating in this telemedicine visit I agree to the above.

## 2019-07-27 DIAGNOSIS — G4733 Obstructive sleep apnea (adult) (pediatric): Secondary | ICD-10-CM | POA: Diagnosis not present

## 2019-08-12 ENCOUNTER — Ambulatory Visit: Payer: BC Managed Care – PPO

## 2019-08-18 DIAGNOSIS — G4733 Obstructive sleep apnea (adult) (pediatric): Secondary | ICD-10-CM | POA: Diagnosis not present

## 2019-09-05 ENCOUNTER — Other Ambulatory Visit: Payer: Self-pay | Admitting: *Deleted

## 2019-09-05 MED ORDER — PROPAFENONE HCL ER 325 MG PO CP12: 325.0000 mg | ORAL_CAPSULE | Freq: Two times a day (BID) | ORAL | 2 refills | Status: DC

## 2019-09-15 DIAGNOSIS — G4733 Obstructive sleep apnea (adult) (pediatric): Secondary | ICD-10-CM | POA: Diagnosis not present

## 2019-10-05 ENCOUNTER — Other Ambulatory Visit: Payer: Self-pay | Admitting: Cardiology

## 2019-10-05 NOTE — Telephone Encounter (Signed)
Age 65, weight 131kg, SCr 1.14 on 01/01/19, last OV 06/2019, afib indication

## 2019-10-25 ENCOUNTER — Other Ambulatory Visit: Payer: Self-pay | Admitting: Cardiology

## 2019-11-02 DIAGNOSIS — G4733 Obstructive sleep apnea (adult) (pediatric): Secondary | ICD-10-CM | POA: Diagnosis not present

## 2019-12-25 DIAGNOSIS — Z1231 Encounter for screening mammogram for malignant neoplasm of breast: Secondary | ICD-10-CM | POA: Diagnosis not present

## 2019-12-25 DIAGNOSIS — Z01419 Encounter for gynecological examination (general) (routine) without abnormal findings: Secondary | ICD-10-CM | POA: Diagnosis not present

## 2019-12-27 DIAGNOSIS — D1801 Hemangioma of skin and subcutaneous tissue: Secondary | ICD-10-CM | POA: Diagnosis not present

## 2019-12-27 DIAGNOSIS — L918 Other hypertrophic disorders of the skin: Secondary | ICD-10-CM | POA: Diagnosis not present

## 2019-12-27 DIAGNOSIS — L821 Other seborrheic keratosis: Secondary | ICD-10-CM | POA: Diagnosis not present

## 2019-12-27 DIAGNOSIS — L820 Inflamed seborrheic keratosis: Secondary | ICD-10-CM | POA: Diagnosis not present

## 2019-12-27 DIAGNOSIS — L700 Acne vulgaris: Secondary | ICD-10-CM | POA: Diagnosis not present

## 2019-12-27 DIAGNOSIS — D225 Melanocytic nevi of trunk: Secondary | ICD-10-CM | POA: Diagnosis not present

## 2019-12-27 DIAGNOSIS — L738 Other specified follicular disorders: Secondary | ICD-10-CM | POA: Diagnosis not present

## 2019-12-27 DIAGNOSIS — D2271 Melanocytic nevi of right lower limb, including hip: Secondary | ICD-10-CM | POA: Diagnosis not present

## 2019-12-27 DIAGNOSIS — L814 Other melanin hyperpigmentation: Secondary | ICD-10-CM | POA: Diagnosis not present

## 2019-12-27 DIAGNOSIS — L718 Other rosacea: Secondary | ICD-10-CM | POA: Diagnosis not present

## 2019-12-27 DIAGNOSIS — D2262 Melanocytic nevi of left upper limb, including shoulder: Secondary | ICD-10-CM | POA: Diagnosis not present

## 2019-12-27 DIAGNOSIS — D2261 Melanocytic nevi of right upper limb, including shoulder: Secondary | ICD-10-CM | POA: Diagnosis not present

## 2019-12-28 ENCOUNTER — Other Ambulatory Visit: Payer: Self-pay | Admitting: Obstetrics

## 2019-12-28 DIAGNOSIS — E2839 Other primary ovarian failure: Secondary | ICD-10-CM

## 2020-01-29 DIAGNOSIS — H35052 Retinal neovascularization, unspecified, left eye: Secondary | ICD-10-CM | POA: Diagnosis not present

## 2020-01-29 DIAGNOSIS — H26492 Other secondary cataract, left eye: Secondary | ICD-10-CM | POA: Diagnosis not present

## 2020-01-29 DIAGNOSIS — H318 Other specified disorders of choroid: Secondary | ICD-10-CM | POA: Diagnosis not present

## 2020-01-29 DIAGNOSIS — H3581 Retinal edema: Secondary | ICD-10-CM | POA: Diagnosis not present

## 2020-01-29 DIAGNOSIS — H2511 Age-related nuclear cataract, right eye: Secondary | ICD-10-CM | POA: Diagnosis not present

## 2020-01-29 DIAGNOSIS — Z961 Presence of intraocular lens: Secondary | ICD-10-CM | POA: Diagnosis not present

## 2020-02-19 ENCOUNTER — Other Ambulatory Visit: Payer: Self-pay

## 2020-02-19 ENCOUNTER — Encounter: Payer: Self-pay | Admitting: Cardiology

## 2020-02-19 ENCOUNTER — Ambulatory Visit: Payer: PPO | Admitting: Cardiology

## 2020-02-19 VITALS — BP 122/84 | HR 86 | Ht 73.0 in | Wt 299.2 lb

## 2020-02-19 DIAGNOSIS — I480 Paroxysmal atrial fibrillation: Secondary | ICD-10-CM

## 2020-02-19 DIAGNOSIS — I4819 Other persistent atrial fibrillation: Secondary | ICD-10-CM | POA: Diagnosis not present

## 2020-02-19 NOTE — Progress Notes (Signed)
Electrophysiology Office Note   Date:  02/19/2020   ID:  Deborah [name], DOB [birth year], MRN [id number]  PCP:  Gaynelle Arabian, MD  Cardiologist:  Irish Lack Primary Electrophysiologist:  Constance Haw, MD    No chief complaint on file.    History of Present Illness: Deborah [name] is a 64 y.o. female who presents today for electrophysiology evaluation.   She has a history of paroxysmal atrial fibrillation.  She had ablation on 12/04/15. She did well for a while, but did return back to atrial fibrillation after her flecainide was stopped. She was started on flecainide and had cardioversion on 10/01/16. When she presented for cardioversion, she was in sinus rhythm.  He did not tolerate flecainide due to QRS lengthening.  She had a repeat atrial fibrillation ablation 12/2017.  She unfortunately came back into clinic in atrial fibrillation and had a repeat cardioversion.  Today, denies symptoms of palpitations, chest pain, orthopnea, PND, lower extremity edema, claudication, dizziness, presyncope, syncope, bleeding, or neurologic sequela. The patient is tolerating medications without difficulties.  Unfortunately on Saturday, she went into atrial fibrillation.  She started feeling weak and fatigued.  She has no chest pain but she does feel shortness of breath.  She has no PND or orthopnea.  Prior to that, she had done well and was without major complaint.   Past Medical History:  Diagnosis Date   A-fib ([place])    Anemia    hx    Anxiety    Arthritis    "was in right knee" (01/25/2018)   Dysrhythmia    atrial fib/flutter   GERD (gastroesophageal reflux disease)    occ   HTN (hypertension)    Migraine    "used to have them more frequently before menopause; now only have a couple/yr" (01/25/2018)   Obesity    OSA on CPAP    PAF (paroxysmal atrial fibrillation) ([place])    s/p DCCV 2010   Peripheral vascular disease ([place]) 2016   after flying in leg   Past Surgical History:   Procedure Laterality Date   ATRIAL FIBRILLATION ABLATION N/A 01/25/2018   Procedure: [place];  Surgeon: Constance Haw, MD;  Location: [facility];  Service: Cardiovascular;  Laterality: N/A;   CARDIAC CATHETERIZATION  2005   CARDIOVERSION N/A 12/09/2017   Procedure: CARDIOVERSION;  Surgeon: Sanda Klein, MD;  Location: [facility] ENDOSCOPY;  Service: Cardiovascular;  Laterality: N/A;   CARDIOVERSION N/A 05/09/2018   Procedure: CARDIOVERSION;  Surgeon: Josue Hector, MD;  Location: [facility] ENDOSCOPY;  Service: Cardiovascular;  Laterality: N/A;   CARDIOVERSION N/A 01/03/2019   Procedure: CARDIOVERSION;  Surgeon: Thayer Headings, MD;  Location: [facility] ENDOSCOPY;  Service: Cardiovascular;  Laterality: N/A;   CATARACT EXTRACTION W/ INTRAOCULAR LENS IMPLANT Left 2016   ELECTROPHYSIOLOGIC STUDY N/A 12/04/2015   Procedure: Atrial Fibrillation Ablation;  Surgeon: Ledonna Dormer Meredith Leeds, MD;  Location: [place] [facility];  Service: Cardiovascular;  Laterality: N/A;   JOINT REPLACEMENT     TONSILLECTOMY AND ADENOIDECTOMY     TOTAL KNEE ARTHROPLASTY Right 04/23/2016   Procedure: TOTAL KNEE ARTHROPLASTY;  Surgeon: [name], MD;  Location: [facility];  Service: Orthopedics;  Laterality: Right;   TUBAL LIGATION       Current Outpatient Medications  Medication Sig Dispense Refill   acetaminophen (TYLENOL) 500 MG tablet Take 1,000 mg by mouth every 6 (six) hours as needed for moderate pain or headache.     ALPRAZolam (XANAX) 0.5 MG tablet Take 0.5  mg by mouth 3 (three) times daily as needed for anxiety.      calcium carbonate (TUMS - DOSED IN MG ELEMENTAL CALCIUM) 500 MG chewable tablet Chew 3 tablets by mouth daily as needed for indigestion or heartburn.      ELIQUIS 5 MG TABS tablet TAKE 1 TABLET BY MOUTH TWICE A DAY 180 tablet 1   escitalopram (LEXAPRO) 10 MG tablet Take 10 mg by mouth every evening.   3   metoprolol succinate (TOPROL-XL) 50 MG 24 hr tablet TAKE 1  TABLET BY MOUTH DAILY. TAKE WITH OR IMMEDIATELY FOLLOWING A MEAL. 90 tablet 2   propafenone (RYTHMOL SR) 325 MG 12 hr capsule Take 1 capsule (325 mg total) by mouth 2 (two) times daily. 180 capsule 2   vitamin B-12 (CYANOCOBALAMIN) 1000 MCG tablet Take 1,000 mcg by mouth daily.     VITAMIN D PO Take 1 tablet by mouth daily.     No current facility-administered medications for this visit.    Allergies:   Benazepril, Adhesive [tape], and Sulfa antibiotics   Social History:  The patient  reports that she has never smoked. She has never used smokeless tobacco. She reports current alcohol use. She reports that she does not use drugs.   Family History:  The patient's family history includes Heart disease in her mother; Stomach cancer in her paternal grandfather.  ROS:  Please see the history of present illness.   Otherwise, review of systems is positive for none.   All other systems are reviewed and negative.   PHYSICAL EXAM: VS:  BP 122/84    Pulse 86    Ht 6\' 1"  (1.854 m)    Wt 299 lb 3.2 oz (135.7 kg)    SpO2 95%    BMI 39.47 kg/m  , BMI Body mass index is 39.47 kg/m. GEN: Well nourished, well developed, in no acute distress  HEENT: normal  Neck: no JVD, carotid bruits, or masses Cardiac: irregular; no murmurs, rubs, or gallops,no edema  Respiratory:  clear to auscultation bilaterally, normal work of breathing GI: soft, nontender, nondistended, + BS MS: no deformity or atrophy  Skin: warm and dry Neuro:  Strength and sensation are intact Psych: euthymic mood, full affect  EKG:  EKG is ordered today. Personal review of the ekg ordered shows atrial fibrillation, rate 80  Recent Labs: No results found for requested labs within last 8760 hours.    Lipid Panel  No results found for: CHOL, TRIG, HDL, CHOLHDL, VLDL, LDLCALC, LDLDIRECT   Wt Readings from Last 3 Encounters:  02/19/20 299 lb 3.2 oz (135.7 kg)  07/24/19 289 lb (131.1 kg)  02/15/19 289 lb (131.1 kg)      Other  studies Reviewed: Additional studies/ records that were reviewed today include: TTE 11/25/15 - Left ventricle: Systolic function was normal. The estimated   ejection fraction was in the range of 50% to 55%. The study is   not technically sufficient to allow evaluation of LV diastolic   function. - Left atrium: The appendage was moderately dilated. - Atrial septum: No defect or patent foramen ovale was identified.   ASSESSMENT AND PLAN:  1.  Persistent atrial fibrillation: Status post ablation 02/01/2016 with repeat ablation 01/25/2018.  CHA2DS2-VASc 2.  Currently on propafenone and Eliquis.  Unfortunately she has gone back into atrial fibrillation.  This occurred on Saturday.  I spoke with her about further options including dofetilide versus repeat ablation.  I do not feel that it is safe to increase  her propafenone as her QRS is certainly wide and a right bundle branch pattern.  We Navon Kotowski give him information on dofetilide.  She Tearra Ouk let us know if this is what she wishes.   2.  Hypertension: Currently well controlled  3.  Obstructive sleep apnea: CPAP compliance encouraged  Current medicines are reviewed at length with the patient today.   The patient does not have concerns regarding her medicines.  The following changes were made today: None  Labs/ tests ordered today include:  Orders Placed This Encounter  Procedures   EKG 12-Lead     Disposition:   FU with Jaianna Nicoll 3 months  Signed, Peterson Mathey Meredith Leeds, MD  02/19/2020 10:46 AM     [address] [place] [address] [phone] (office) [phone] (fax)

## 2020-02-19 NOTE — Patient Instructions (Signed)
Medication Instructions:  Your physician recommends that you continue on your current medications as directed. Please refer to the Current Medication list given to you today.  *If you need a refill on your cardiac medications before your next appointment, please call your pharmacy*   Lab Work: None ordered   Testing/Procedures: None ordered   Follow-Up: At [facility] you and your health needs are our priority.  As part of our continuing mission to provide you with exceptional heart care, we have created designated Provider Care Teams.  These Care Teams include your primary Cardiologist (physician) and Advanced Practice Providers (APPs -  Physician Assistants and Nurse Practitioners) who all work together to provide you with the care you need, when you need it.  We recommend signing up for the patient portal called "MyChart".  Sign up information is provided on this After Visit Summary.  MyChart is used to connect with patients for Virtual Visits (Telemedicine).  Patients are able to view lab/test results, encounter notes, upcoming appointments, etc.  Non-urgent messages can be sent to your provider as well.   To learn more about what you can do with MyChart, go to [email or web address].    Your next appointment:   3 month(s)  The format for your next appointment:   In Person  Provider:   Allegra Lai, MD   Thank you for choosing [facility]!!   Trinidad Curet, RN [phone]    Other Instructions  Dofetilide capsules What is this medicine? DOFETILIDE (doe FET il ide) is an antiarrhythmic drug. It helps make your heart beat regularly. This medicine also helps to slow rapid heartbeats. This medicine may be used for other purposes; ask your health care provider or pharmacist if you have questions. COMMON BRAND NAME(S): Tikosyn What should I tell my health care provider before I take this medicine? They need to know if you have any of these conditions:  heart  disease  history of irregular heartbeat  history of low levels of potassium or magnesium in the blood  kidney disease  liver disease  an unusual or allergic reaction to dofetilide, other medicines, foods, dyes, or preservatives  pregnant or trying to get pregnant  breast-feeding How should I use this medicine? Take this medicine by mouth with a glass of water. Follow the directions on the prescription label. Do not take with grapefruit juice. You can take it with or without food. If it upsets your stomach, take it with food. Take your medicine at regular intervals. Do not take it more often than directed. Do not stop taking except on your doctor's advice. A special MedGuide will be given to you by the pharmacist with each prescription and refill. Be sure to read this information carefully each time. Talk to your pediatrician regarding the use of this medicine in children. Special care may be needed. Overdosage: If you think you have taken too much of this medicine contact a poison control center or emergency room at once. NOTE: This medicine is only for you. Do not share this medicine with others. What if I miss a dose? If you miss a dose, skip it. Take your next dose at the normal time. Do not take extra or 2 doses at the same time to make up for the missed dose. What may interact with this medicine? Do not take this medicine with any of the following medications:  cimetidine  cisapride  dolutegravir  dronedarone  hydrochlorothiazide  ketoconazole  megestrol  pimozide  prochlorperazine  thioridazine  trimethoprim  verapamil This medicine may also interact with the following medications:  amiloride  cannabinoids  certain antibiotics like erythromycin or clarithromycin  certain antiviral medicines for HIV or hepatitis  certain medicines for depression, anxiety, or psychotic disorders  digoxin  diltiazem  grapefruit  juice  metformin  nefazodone  other medicines that prolong the QT interval (an abnormal heart rhythm)  quinine  triamterene  zafirlukast  ziprasidone This list may not describe all possible interactions. Give your health care provider a list of all the medicines, herbs, non-prescription drugs, or dietary supplements you use. Also tell them if you smoke, drink alcohol, or use illegal drugs. Some items may interact with your medicine. What should I watch for while using this medicine? Your condition will be monitored carefully while you are receiving this medicine. What side effects may I notice from receiving this medicine? Side effects that you should report to your doctor or health care professional as soon as possible:  allergic reactions like skin rash, itching or hives, swelling of the face, lips, or tongue  breathing problems  chest pain or chest tightness  dizziness  signs and symptoms of a dangerous change in heartbeat or heart rhythm like chest pain; dizziness; fast or irregular heartbeat; palpitations; feeling faint or lightheaded, falls; breathing problems  signs and symptoms of electrolyte imbalance like severe diarrhea, unusual sweating, vomiting, loss of appetite, increased thirst  swelling of the ankles, legs, or feet  tingling, numbness in the hands or feet Side effects that usually do not require medical attention (report to your doctor or health care professional if they continue or are bothersome):  diarrhea  general ill feeling or flu-like symptoms  headache  nausea  trouble sleeping  stomach pain This list may not describe all possible side effects. Call your doctor for medical advice about side effects. You may report side effects to FDA at 1-800-FDA-1088. Where should I keep my medicine? Keep out of the reach of children. Store at room temperature between 15 and 30 degrees C (59 and 86 degrees F). Throw away any unused medicine after the  expiration date. NOTE: This sheet is a summary. It may not cover all possible information. If you have questions about this medicine, talk to your doctor, pharmacist, or health care provider.  2020 Elsevier/Gold Standard (2018-06-05 10:18:48)    Preparing for Tikosyn (Dofetilide) Admission  1) Check with insurance company for cost of drug to ensure affordability.  Dofetilide 500 mcg twice a day  2) Tikosyn admission requires a 3 day hospital stay with constant telemetry monitoring. You will have and EKG after each dose of Tikosyn.  If the drug does not convert you to normal rhythm a cardioversion will be performed prior to discharge.  3) A pharmacist will review all of your medications.  If there are any interactions noted we will be in touch with you.  4) Please ensure no missed doses of your anticoagulation (blood thinner) for the past 3 weeks prior to admission.  5) No Benadryl is allowed 3 days prior to admission.  6) On day of admission - you will come in for office visit in the [facility] where lab work will be drawn.  IF the labs are acceptable an inpatient bed will be requested.  7) Normally admission to a bed is straight from our office.  This is all dependent on bed availability.  In some instances you may be sent home and bed placement will call later in the day when a  bed is available.  8) You may bring personal belongings/clothing with you to the hospital.  Please leave your suitcase in the care until you arrive in admissions.  Questions - please call the office at [phone]

## 2020-02-20 DIAGNOSIS — G4733 Obstructive sleep apnea (adult) (pediatric): Secondary | ICD-10-CM | POA: Diagnosis not present

## 2020-02-29 DIAGNOSIS — I10 Essential (primary) hypertension: Secondary | ICD-10-CM | POA: Diagnosis not present

## 2020-02-29 DIAGNOSIS — F411 Generalized anxiety disorder: Secondary | ICD-10-CM | POA: Diagnosis not present

## 2020-02-29 DIAGNOSIS — N183 Chronic kidney disease, stage 3 unspecified: Secondary | ICD-10-CM | POA: Diagnosis not present

## 2020-02-29 DIAGNOSIS — I480 Paroxysmal atrial fibrillation: Secondary | ICD-10-CM | POA: Diagnosis not present

## 2020-02-29 DIAGNOSIS — G4733 Obstructive sleep apnea (adult) (pediatric): Secondary | ICD-10-CM | POA: Diagnosis not present

## 2020-02-29 DIAGNOSIS — Z23 Encounter for immunization: Secondary | ICD-10-CM | POA: Diagnosis not present

## 2020-02-29 DIAGNOSIS — Z0000 Encounter for general adult medical examination without abnormal findings: Secondary | ICD-10-CM | POA: Diagnosis not present

## 2020-02-29 DIAGNOSIS — E538 Deficiency of other specified B group vitamins: Secondary | ICD-10-CM | POA: Diagnosis not present

## 2020-02-29 DIAGNOSIS — G629 Polyneuropathy, unspecified: Secondary | ICD-10-CM | POA: Diagnosis not present

## 2020-02-29 DIAGNOSIS — D6869 Other thrombophilia: Secondary | ICD-10-CM | POA: Diagnosis not present

## 2020-03-18 ENCOUNTER — Other Ambulatory Visit: Payer: PPO

## 2020-03-26 NOTE — Telephone Encounter (Signed)
Pt agreeable to 11/10 ablation date.  Aware I will place her on the procedure wait list, in case sooner date becomes available. Aware I will be in touch at later date to go over instructions. She is agreeable to plan.

## 2020-03-27 ENCOUNTER — Other Ambulatory Visit: Payer: Self-pay | Admitting: Cardiology

## 2020-03-27 NOTE — Telephone Encounter (Signed)
Pt last saw Dr Curt Bears 02/19/20, last labs 02/29/20 Creat 1.29, age 64, weight 135.7kg, based on specified criteria pt is on appropriate dosage of Eliquis 5mg  BID.  Will refill rx.

## 2020-04-01 ENCOUNTER — Telehealth: Payer: Self-pay | Admitting: *Deleted

## 2020-04-01 DIAGNOSIS — Z01812 Encounter for preprocedural laboratory examination: Secondary | ICD-10-CM

## 2020-04-01 DIAGNOSIS — I4819 Other persistent atrial fibrillation: Secondary | ICD-10-CM

## 2020-04-01 NOTE — Telephone Encounter (Signed)
Informed that ablation date came open for next Friday, 10/15.   Pt agreeable. Aware instructions for CT & ablation will be sent via Mychart. Aware office will call to arrange CT and post procedure follow up. Patient verbalized understanding and agreeable to plan.

## 2020-04-02 ENCOUNTER — Other Ambulatory Visit: Payer: PPO | Admitting: *Deleted

## 2020-04-02 ENCOUNTER — Other Ambulatory Visit: Payer: Self-pay

## 2020-04-02 DIAGNOSIS — I4819 Other persistent atrial fibrillation: Secondary | ICD-10-CM

## 2020-04-02 DIAGNOSIS — Z01812 Encounter for preprocedural laboratory examination: Secondary | ICD-10-CM

## 2020-04-03 LAB — CBC
Hematocrit: 39.1 % (ref 34.0–46.6)
Hemoglobin: 12.7 g/dL (ref 11.1–15.9)
MCH: 27.7 pg (ref 26.6–33.0)
MCHC: 32.5 g/dL (ref 31.5–35.7)
MCV: 85 fL (ref 79–97)
Platelets: 262 10*3/uL (ref 150–450)
RBC: 4.58 x10E6/uL (ref 3.77–5.28)
RDW: 14.3 % (ref 11.7–15.4)
WBC: 6 10*3/uL (ref 3.4–10.8)

## 2020-04-03 LAB — BASIC METABOLIC PANEL
BUN/Creatinine Ratio: 10 — ABNORMAL LOW (ref 12–28)
BUN: 12 mg/dL (ref 8–27)
CO2: 25 mmol/L (ref 20–29)
Calcium: 9.2 mg/dL (ref 8.7–10.3)
Chloride: 107 mmol/L — ABNORMAL HIGH (ref 96–106)
Creatinine, Ser: 1.2 mg/dL — ABNORMAL HIGH (ref 0.57–1.00)
GFR calc Af Amer: 55 mL/min/{1.73_m2} — ABNORMAL LOW (ref >59)
GFR calc non Af Amer: 48 mL/min/{1.73_m2} — ABNORMAL LOW (ref >59)
Glucose: 84 mg/dL (ref 65–99)
Potassium: 4.9 mmol/L (ref 3.5–5.2)
Sodium: 143 mmol/L (ref 134–144)

## 2020-04-04 ENCOUNTER — Telehealth (HOSPITAL_COMMUNITY): Payer: Self-pay | Admitting: Emergency Medicine

## 2020-04-04 NOTE — Telephone Encounter (Signed)
Reaching out to patient to offer assistance regarding upcoming cardiac imaging study; pt verbalizes understanding of appt date/time, parking situation and where to check in, pre-test NPO status and medications ordered, and verified current allergies; name and call back number provided for further questions should they arise Tuan Tippin RN Navigator Cardiac Imaging [facility] [phone] office [phone] cell 

## 2020-04-07 ENCOUNTER — Ambulatory Visit (HOSPITAL_COMMUNITY)
Admission: RE | Admit: 2020-04-07 | Discharge: 2020-04-07 | Disposition: A | Discharge status: Home / Self Care | Payer: PPO | Source: Ambulatory Visit | Attending: Cardiology | Admitting: Cardiology

## 2020-04-07 ENCOUNTER — Other Ambulatory Visit: Payer: Self-pay

## 2020-04-07 DIAGNOSIS — I4819 Other persistent atrial fibrillation: Secondary | ICD-10-CM | POA: Diagnosis not present

## 2020-04-07 MED ORDER — IOHEXOL 350 MG/ML SOLN
80.0000 mL | Freq: Once | INTRAVENOUS | Status: AC | PRN
  Administered 2020-04-07: 80 mL via INTRAVENOUS

## 2020-04-09 ENCOUNTER — Other Ambulatory Visit (HOSPITAL_COMMUNITY)
Admission: RE | Admit: 2020-04-09 | Discharge: 2020-04-09 | Disposition: A | Discharge status: Home / Self Care | Payer: PPO | Source: Ambulatory Visit | Attending: Cardiology | Admitting: Cardiology

## 2020-04-09 DIAGNOSIS — Z20822 Contact with and (suspected) exposure to covid-19: Secondary | ICD-10-CM | POA: Insufficient documentation

## 2020-04-09 DIAGNOSIS — Z01812 Encounter for preprocedural laboratory examination: Secondary | ICD-10-CM | POA: Insufficient documentation

## 2020-04-09 LAB — SARS CORONAVIRUS 2 (TAT 6-24 HRS): SARS Coronavirus 2: NEGATIVE

## 2020-04-10 NOTE — Progress Notes (Signed)
Instructed patient on the following items: Arrival time 0830 Nothing to eat or drink after midnight No meds AM of procedure Responsible person to drive you home and stay with you for 24 hrs  Have you missed any doses of anti-coagulant E;iquis-haven't missed any doses

## 2020-04-11 ENCOUNTER — Ambulatory Visit (HOSPITAL_COMMUNITY)
Admission: RE | Disposition: A | Discharge status: Home / Self Care | Payer: PPO | Source: Home / Self Care | Attending: Cardiology

## 2020-04-11 ENCOUNTER — Encounter (HOSPITAL_COMMUNITY): Payer: Self-pay | Admitting: Cardiology

## 2020-04-11 ENCOUNTER — Ambulatory Visit (HOSPITAL_COMMUNITY)
Admission: RE | Admit: 2020-04-11 | Discharge: 2020-04-11 | Disposition: A | Discharge status: Home / Self Care | Payer: PPO | Attending: Cardiology | Admitting: Cardiology

## 2020-04-11 ENCOUNTER — Ambulatory Visit (HOSPITAL_COMMUNITY): Payer: PPO | Admitting: Certified Registered Nurse Anesthetist

## 2020-04-11 DIAGNOSIS — Z6836 Body mass index (BMI) 36.0-36.9, adult: Secondary | ICD-10-CM | POA: Diagnosis not present

## 2020-04-11 DIAGNOSIS — K219 Gastro-esophageal reflux disease without esophagitis: Secondary | ICD-10-CM | POA: Diagnosis not present

## 2020-04-11 DIAGNOSIS — E669 Obesity, unspecified: Secondary | ICD-10-CM | POA: Insufficient documentation

## 2020-04-11 DIAGNOSIS — I10 Essential (primary) hypertension: Secondary | ICD-10-CM | POA: Insufficient documentation

## 2020-04-11 DIAGNOSIS — Z888 Allergy status to other drugs, medicaments and biological substances status: Secondary | ICD-10-CM | POA: Insufficient documentation

## 2020-04-11 DIAGNOSIS — Z882 Allergy status to sulfonamides status: Secondary | ICD-10-CM | POA: Diagnosis not present

## 2020-04-11 DIAGNOSIS — I4819 Other persistent atrial fibrillation: Secondary | ICD-10-CM | POA: Insufficient documentation

## 2020-04-11 DIAGNOSIS — F419 Anxiety disorder, unspecified: Secondary | ICD-10-CM | POA: Diagnosis not present

## 2020-04-11 DIAGNOSIS — G4733 Obstructive sleep apnea (adult) (pediatric): Secondary | ICD-10-CM | POA: Insufficient documentation

## 2020-04-11 DIAGNOSIS — I739 Peripheral vascular disease, unspecified: Secondary | ICD-10-CM | POA: Insufficient documentation

## 2020-04-11 HISTORY — PX: ATRIAL FIBRILLATION ABLATION: EP1191

## 2020-04-11 LAB — POCT ACTIVATED CLOTTING TIME
Activated Clotting Time: 229 seconds
Activated Clotting Time: 274 seconds

## 2020-04-11 SURGERY — ATRIAL FIBRILLATION ABLATION
Anesthesia: General

## 2020-04-11 MED ORDER — HEPARIN SODIUM (PORCINE) 1000 UNIT/ML IJ SOLN
INTRAMUSCULAR | Status: DC | PRN
  Administered 2020-04-11: 1000 [IU] via INTRAVENOUS

## 2020-04-11 MED ORDER — ADENOSINE 6 MG/2ML IV SOLN
INTRAVENOUS | Status: AC
  Filled 2020-04-11: qty 4

## 2020-04-11 MED ORDER — ONDANSETRON HCL 4 MG/2ML IJ SOLN: 4.0000 mg | Freq: Four times a day (QID) | INTRAMUSCULAR | Status: DC | PRN

## 2020-04-11 MED ORDER — HEPARIN SODIUM (PORCINE) 1000 UNIT/ML IJ SOLN
INTRAMUSCULAR | Status: DC | PRN
  Administered 2020-04-11: 5000 [IU] via INTRAVENOUS
  Administered 2020-04-11: 8000 [IU] via INTRAVENOUS
  Administered 2020-04-11: 15000 [IU] via INTRAVENOUS

## 2020-04-11 MED ORDER — MIDAZOLAM HCL 2 MG/2ML IJ SOLN
INTRAMUSCULAR | Status: DC | PRN
  Administered 2020-04-11: 2 mg via INTRAVENOUS

## 2020-04-11 MED ORDER — FENTANYL CITRATE (PF) 250 MCG/5ML IJ SOLN
INTRAMUSCULAR | Status: DC | PRN
Start: 2020-04-11 — End: 2020-04-11
  Administered 2020-04-11 (×2): 50 ug via INTRAVENOUS

## 2020-04-11 MED ORDER — DEXAMETHASONE SODIUM PHOSPHATE 10 MG/ML IJ SOLN
INTRAMUSCULAR | Status: DC | PRN
  Administered 2020-04-11: 5 mg via INTRAVENOUS

## 2020-04-11 MED ORDER — SODIUM CHLORIDE 0.9% FLUSH: 3.0000 mL | Freq: Two times a day (BID) | INTRAVENOUS | Status: DC

## 2020-04-11 MED ORDER — SODIUM CHLORIDE 0.9 % IV SOLN: 250.0000 mL | INTRAVENOUS | Status: DC | PRN

## 2020-04-11 MED ORDER — ACETAMINOPHEN 325 MG PO TABS
650.0000 mg | ORAL_TABLET | ORAL | Status: DC | PRN
  Administered 2020-04-11: 650 mg via ORAL

## 2020-04-11 MED ORDER — ALBUTEROL SULFATE HFA 108 (90 BASE) MCG/ACT IN AERS
INHALATION_SPRAY | RESPIRATORY_TRACT | Status: DC | PRN
  Administered 2020-04-11: 2 via RESPIRATORY_TRACT
  Administered 2020-04-11: 4 via RESPIRATORY_TRACT

## 2020-04-11 MED ORDER — DOBUTAMINE IN D5W 4-5 MG/ML-% IV SOLN
INTRAVENOUS | Status: DC | PRN
  Administered 2020-04-11: 20 ug/kg/min via INTRAVENOUS

## 2020-04-11 MED ORDER — PROTAMINE SULFATE 10 MG/ML IV SOLN
INTRAVENOUS | Status: DC | PRN
  Administered 2020-04-11: 40 mg via INTRAVENOUS

## 2020-04-11 MED ORDER — ROCURONIUM BROMIDE 10 MG/ML (PF) SYRINGE
PREFILLED_SYRINGE | INTRAVENOUS | Status: DC | PRN
  Administered 2020-04-11 (×2): 50 mg via INTRAVENOUS

## 2020-04-11 MED ORDER — SODIUM CHLORIDE 0.9 % IV SOLN: INTRAVENOUS | Status: DC

## 2020-04-11 MED ORDER — SUGAMMADEX SODIUM 200 MG/2ML IV SOLN
INTRAVENOUS | Status: DC | PRN
  Administered 2020-04-11: 300 mg via INTRAVENOUS

## 2020-04-11 MED ORDER — ACETAMINOPHEN 325 MG PO TABS
ORAL_TABLET | ORAL | Status: AC
  Filled 2020-04-11: qty 2

## 2020-04-11 MED ORDER — LIDOCAINE 2% (20 MG/ML) 5 ML SYRINGE
INTRAMUSCULAR | Status: DC | PRN
  Administered 2020-04-11: 40 mg via INTRAVENOUS

## 2020-04-11 MED ORDER — HEPARIN (PORCINE) IN NACL 1000-0.9 UT/500ML-% IV SOLN
INTRAVENOUS | Status: DC | PRN
  Administered 2020-04-11 (×5): 500 mL

## 2020-04-11 MED ORDER — PHENYLEPHRINE HCL-NACL 10-0.9 MG/250ML-% IV SOLN
INTRAVENOUS | Status: DC | PRN
  Administered 2020-04-11: 25 ug/min via INTRAVENOUS

## 2020-04-11 MED ORDER — HEPARIN SODIUM (PORCINE) 1000 UNIT/ML IJ SOLN
INTRAMUSCULAR | Status: AC
  Filled 2020-04-11: qty 1

## 2020-04-11 MED ORDER — ONDANSETRON HCL 4 MG/2ML IJ SOLN
INTRAMUSCULAR | Status: DC | PRN
  Administered 2020-04-11: 4 mg via INTRAVENOUS

## 2020-04-11 MED ORDER — DOBUTAMINE IN D5W 4-5 MG/ML-% IV SOLN
INTRAVENOUS | Status: AC
  Filled 2020-04-11: qty 250

## 2020-04-11 MED ORDER — SODIUM CHLORIDE 0.9% FLUSH: 3.0000 mL | INTRAVENOUS | Status: DC | PRN

## 2020-04-11 MED ORDER — PROPOFOL 10 MG/ML IV BOLUS
INTRAVENOUS | Status: DC | PRN
  Administered 2020-04-11: 150 mg via INTRAVENOUS
  Administered 2020-04-11: 30 mg via INTRAVENOUS

## 2020-04-11 SURGICAL SUPPLY — 22 items
BLANKET WARM UNDERBOD FULL ACC (MISCELLANEOUS) ×2 IMPLANT
CATH MAPPNG PENTARAY F 2-6-2MM (CATHETERS) ×1 IMPLANT
CATH S CIRCA THERM PROBE 10F (CATHETERS) ×2 IMPLANT
CATH SMTCH THERMOCOOL SF DF (CATHETERS) ×2 IMPLANT
CATH SOUNDSTAR ECO 8FR (CATHETERS) ×2 IMPLANT
CATH WEBSTER BI DIR CS D-F CRV (CATHETERS) ×2 IMPLANT
CLOSURE PERCLOSE PROSTYLE (VASCULAR PRODUCTS) ×8 IMPLANT
COVER DOME SNAP 22 D (MISCELLANEOUS) ×2 IMPLANT
COVER SWIFTLINK CONNECTOR (BAG) ×2 IMPLANT
KIT VERSACROSS STEERABLE D1 (CATHETERS) ×2 IMPLANT
MAT PREVALON FULL STRYKER (MISCELLANEOUS) ×2 IMPLANT
PACK EP LATEX FREE (CUSTOM PROCEDURE TRAY) ×2
PACK EP LF (CUSTOM PROCEDURE TRAY) ×1 IMPLANT
PAD PRO RADIOLUCENT 2001M-C (PAD) ×2 IMPLANT
PATCH CARTO3 (PAD) ×2 IMPLANT
PENTARAY F 2-6-2MM (CATHETERS) ×2
SHEATH CARTO VIZIGO SM CVD (SHEATH) ×2 IMPLANT
SHEATH PINNACLE 7F 10CM (SHEATH) ×2 IMPLANT
SHEATH PINNACLE 8F 10CM (SHEATH) ×4 IMPLANT
SHEATH PINNACLE 9F 10CM (SHEATH) ×2 IMPLANT
SHEATH PROBE COVER 6X72 (BAG) ×2 IMPLANT
TUBING SMART ABLATE COOLFLOW (TUBING) ×2 IMPLANT

## 2020-04-11 NOTE — Progress Notes (Signed)
Up and walked and tolerated well; bilat groins stabe, no bleeding or hematoma

## 2020-04-11 NOTE — H&P (Signed)
Electrophysiology Office Note   Date:  04/11/2020   ID:  Deborah [name], DOB [birth year], MRN [id number]  PCP:  Gaynelle Arabian, MD  Cardiologist:  Irish Lack Primary Electrophysiologist:  Constance Haw, MD    No chief complaint on file.    History of Present Illness: Deborah [name] is a 64 y.o. female who presents today for electrophysiology evaluation.   She has a history of paroxysmal atrial fibrillation.  She had ablation on 12/04/15. She did well for a while, but did return back to atrial fibrillation after her flecainide was stopped. She was started on flecainide and had cardioversion on 10/01/16. When she presented for cardioversion, she was in sinus rhythm.  He did not tolerate flecainide due to QRS lengthening.  She had a repeat atrial fibrillation ablation 12/2017.  She unfortunately came back into clinic in atrial fibrillation and had a repeat cardioversion.  Today, denies symptoms of palpitations, chest pain, shortness of breath, orthopnea, PND, lower extremity edema, claudication, dizziness, presyncope, syncope, bleeding, or neurologic sequela. The patient is tolerating medications without difficulties. Plan for AF ablaitlon today.   Past Medical History:  Diagnosis Date  . A-fib ([place])   . Anemia    hx   . Anxiety   . Arthritis    "was in right knee" (01/25/2018)  . Dysrhythmia    atrial fib/flutter  . GERD (gastroesophageal reflux disease)    occ  . HTN (hypertension)   . Migraine    "used to have them more frequently before menopause; now only have a couple/yr" (01/25/2018)  . Obesity   . OSA on CPAP   . PAF (paroxysmal atrial fibrillation) ([place])    s/p DCCV 2010  . Peripheral vascular disease ([place]) 2016   after flying in leg   Past Surgical History:  Procedure Laterality Date  . ATRIAL FIBRILLATION ABLATION N/A 01/25/2018   Procedure: ATRIAL FIBRILLATION ABLATION;  Surgeon: Constance Haw, MD;  Location: [facility] CV LAB;  Service: Cardiovascular;   Laterality: N/A;  . CARDIAC CATHETERIZATION  2005  . CARDIOVERSION N/A 12/09/2017   Procedure: CARDIOVERSION;  Surgeon: Sanda Klein, MD;  Location: [facility] ENDOSCOPY;  Service: Cardiovascular;  Laterality: N/A;  . CARDIOVERSION N/A 05/09/2018   Procedure: CARDIOVERSION;  Surgeon: Josue Hector, MD;  Location: [facility] ENDOSCOPY;  Service: Cardiovascular;  Laterality: N/A;  . CARDIOVERSION N/A 01/03/2019   Procedure: CARDIOVERSION;  Surgeon: Acie Fredrickson Wonda Cheng, MD;  Location: [facility];  Service: Cardiovascular;  Laterality: N/A;  . CATARACT EXTRACTION W/ INTRAOCULAR LENS IMPLANT Left 2016  . ELECTROPHYSIOLOGIC STUDY N/A 12/04/2015   Procedure: Atrial Fibrillation Ablation;  Surgeon: Poet Hineman Meredith Leeds, MD;  Location: [place] CV LAB;  Service: Cardiovascular;  Laterality: N/A;  . JOINT REPLACEMENT    . TONSILLECTOMY AND ADENOIDECTOMY    . TOTAL KNEE ARTHROPLASTY Right 04/23/2016   Procedure: TOTAL KNEE ARTHROPLASTY;  Surgeon: [name], MD;  Location: [facility];  Service: Orthopedics;  Laterality: Right;  . TUBAL LIGATION       Current Facility-Administered Medications  Medication Dose Route Frequency Provider Last Rate Last Admin  . 0.9 %  sodium chloride infusion   Intravenous Continuous Constance Haw, MD 50 mL/hr at 04/11/20 0858 New Bag at 04/11/20 6270    Allergies:   Benazepril, Adhesive [tape], and Sulfa antibiotics   Social History:  The patient  reports that she has never smoked. She has never used smokeless tobacco. She reports current alcohol use. She reports that she does not use  drugs.   Family History:  The patient's family history includes Heart disease in her mother; Stomach cancer in her paternal grandfather.  ROS:  Please see the history of present illness.   Otherwise, review of systems is positive for none.   All other systems are reviewed and negative.   PHYSICAL EXAM: VS:  BP 130/76   Pulse 83   Temp 98.1 F (36.7 C) (Oral)   Resp 18   Ht 6\' 1"  (1.854 m)    Wt 127 kg   SpO2 97%   BMI 36.94 kg/m  , BMI Body mass index is 36.94 kg/m. GEN: Well nourished, well developed, in no acute distress  HEENT: normal  Neck: no JVD, carotid bruits, or masses Cardiac: RRR; no murmurs, rubs, or gallops,no edema  Respiratory:  clear to auscultation bilaterally, normal work of breathing GI: soft, nontender, nondistended, + BS MS: no deformity or atrophy  Skin: warm and dry,  Neuro:  Strength and sensation are intact Psych: euthymic mood, full affect   Recent Labs: 04/02/2020: BUN 12; Creatinine, Ser 1.20; Hemoglobin 12.7; Platelets 262; Potassium 4.9; Sodium 143    Lipid Panel  No results found for: CHOL, TRIG, HDL, CHOLHDL, VLDL, LDLCALC, LDLDIRECT   Wt Readings from Last 3 Encounters:  04/11/20 127 kg  02/19/20 135.7 kg  07/24/19 131.1 kg      Other studies Reviewed: Additional studies/ records that were reviewed today include: TTE 11/25/15 - Left ventricle: Systolic function was normal. The estimated   ejection fraction was in the range of 50% to 55%. The study is   not technically sufficient to allow evaluation of LV diastolic   function. - Left atrium: The appendage was moderately dilated. - Atrial septum: No defect or patent foramen ovale was identified.   ASSESSMENT AND PLAN:  1.  Persistent atrial fibrillation: DEANE MELICK has presented today for surgery, with the diagnosis of atrial fibrillation.  The various methods of treatment have been discussed with the patient and family. After consideration of risks, benefits and other options for treatment, the patient has consented to  Procedure(s): Catheter ablation as a surgical intervention .  Risks include but not limited to bleeding, vascular damage, tamponade, heart block, stroke, damage to surrounding organs, among others. The patient's history has been reviewed, patient examined, no change in status, stable for surgery.  I have reviewed the patient's chart and labs.  Questions were  answered to the patient's satisfaction.    Deborah [name] Deborah Bears, MD 04/11/2020 10:38 AM

## 2020-04-11 NOTE — Transfer of Care (Signed)
Immediate Anesthesia Transfer of Care Note  Patient: Deborah [name]  Procedure(s) Performed: ATRIAL FIBRILLATION ABLATION (N/A )  Patient Location: Cath Lab  Anesthesia Type:General  Level of Consciousness: drowsy  Airway & Oxygen Therapy: Patient Spontanous Breathing and Patient connected to nasal cannula oxygen  Post-op Assessment: Report given to RN and Post -op Vital signs reviewed and stable  Post vital signs: Reviewed and stable  Last Vitals:  Vitals Value Taken Time  BP 120/75 04/11/20 1440  Temp    Pulse 95 04/11/20 1445  Resp 13 04/11/20 1445  SpO2 98 % 04/11/20 1445  Vitals shown include unvalidated device data.  Last Pain:  Vitals:   04/11/20 1441  TempSrc:   PainSc: 0-No pain      Patients Stated Pain Goal: 3 (82/42/99 8069)  Complications: No complications documented.

## 2020-04-11 NOTE — Discharge Instructions (Signed)
Cardiac Ablation, Care After This sheet gives you information about how to care for yourself after your procedure. Your health care provider may also give you more specific instructions. If you have problems or questions, contact your health care provider. What can I expect after the procedure? After the procedure, it is common to have:  Bruising around your puncture site.  Tenderness around your puncture site.  Skipped heartbeats.  Tiredness (fatigue). Follow these instructions at home: Puncture site care   Follow instructions from your health care provider about how to take care of your puncture site. Make sure you: ? Wash your hands with soap and water before you change your bandage (dressing). If soap and water are not available, use hand sanitizer. ? Change your dressing as told by your health care provider. ? Leave stitches (sutures), skin glue, or adhesive strips in place. These skin closures may need to stay in place for up to 2 weeks. If adhesive strip edges start to loosen and curl up, you may trim the loose edges. Do not remove adhesive strips completely unless your health care provider tells you to do that.  Check your puncture site every day for signs of infection. Check for: ? Redness, swelling, or pain. ? Fluid or blood. If your puncture site starts to bleed, lie down on your back, apply firm pressure to the area, and contact your health care provider. ? Warmth. ? Pus or a bad smell. Driving  Ask your health care provider when it is safe for you to drive again after the procedure.  Do not drive or use heavy machinery while taking prescription pain medicine.  . Activity  Avoid activities that take a lot of effort for at least 3 days after your procedure.   General instructions  Take over-the-counter and prescription medicines only as told by your health care provider.  Do not use any products that contain nicotine or tobacco, such as cigarettes and  e-cigarettes. If you need help quitting, ask your health care provider.  Do not take baths, swim, or use a hot tub until your health care provider approves.  Do not drink alcohol for 24 hours after your procedure.  Keep all follow-up visits as told by your health care provider. This is important. Contact a health care provider if:  You have redness, mild swelling, or pain around your puncture site.  You have fluid or blood coming from your puncture site that stops after applying firm pressure to the area.  Your puncture site feels warm to the touch.  You have pus or a bad smell coming from your puncture site.  You have a fever.  You have chest pain or discomfort that spreads to your neck, jaw, or arm.  You are sweating a lot.  You feel nauseous.  You have a fast or irregular heartbeat.  You have shortness of breath.  You are dizzy or light-headed and feel the need to lie down.  You have pain or numbness in the arm or leg closest to your puncture site. Get help right away if:  Your puncture site suddenly swells.  Your puncture site is bleeding and the bleeding does not stop after applying firm pressure to the area. These symptoms may represent a serious problem that is an emergency. Do not wait to see if the symptoms will go away. Get medical help right away. Call your local emergency services (911 in the [place] Do not drive yourself to the hospital. Summary  After the procedure, it  is normal to have bruising and tenderness at the puncture site in your groin, neck, or forearm.  Check your puncture site every day for signs of infection.  Get help right away if your puncture site is bleeding and the bleeding does not stop after applying firm pressure to the area. This is a medical emergency. This information is not intended to replace advice given to you by your health care provider. Make sure you discuss any questions you have with your health care provider. Document  Revised: 05/27/2017 Document Reviewed: 09/23/2016 Elsevier Patient Education  [facility] procedure care instructions No driving for 4 days. No lifting over 5 lbs for 1 week. No vigorous or sexual activity for 1 week. You may return to work/your usual activities on 04/18/2020. Keep procedure site clean & dry. If you notice increased pain, swelling, bleeding or pus, call/return!  You may shower, but no soaking baths/hot tubs/pools for 1 week.    You have an appointment set up with the [facility].  Multiple studies have shown that being followed by a dedicated atrial fibrillation clinic in addition to the standard care you receive from your other physicians improves health. We believe that enrollment in the atrial fibrillation clinic will allow [place] to better care for you.   The phone number to the [facility] is [phone]. The clinic is staffed Monday through Friday from 8:30am to 5pm.  Parking Directions: The clinic is located in the Heart and Vascular Building connected to [facility]. 1)From [address] turn on to [address] and go to the 3rd entrance  (Heart and Vascular entrance) on the right. 2)Look to the right for Heart &Vascular Parking Garage. 3)A code for the entrance is required, for November is 3008.   4)Take the elevators to the 1st floor. Registration is in the room with the glass walls at the end of the hallway.  If you have any trouble parking or locating the clinic, please don't hesitate to call [phone].

## 2020-04-11 NOTE — Anesthesia Postprocedure Evaluation (Signed)
Anesthesia Post Note  Patient: Deborah [name]  Procedure(s) Performed: ATRIAL FIBRILLATION ABLATION (N/A )     Patient location during evaluation: PACU Anesthesia Type: General Level of consciousness: awake Pain management: pain level controlled Vital Signs Assessment: post-procedure vital signs reviewed and stable Respiratory status: spontaneous breathing Cardiovascular status: stable Postop Assessment: no apparent nausea or vomiting Anesthetic complications: no   No complications documented.  Last Vitals:  Vitals:   04/11/20 1520 04/11/20 1525  BP: 112/80 115/79  Pulse: 100 99  Resp: 13 11  Temp:    SpO2: 100% 95%    Last Pain:  Vitals:   04/11/20 1441  TempSrc:   PainSc: 0-No pain                 Chaylee Ehrsam

## 2020-04-11 NOTE — Anesthesia Preprocedure Evaluation (Addendum)
Anesthesia Evaluation  Patient identified by MRN, date of birth, ID band Patient awake    Reviewed: Allergy & Precautions, NPO status , Patient's Chart, lab work & pertinent test results  Airway Mallampati: I  TM Distance: >3 FB Neck ROM: Full    Dental  (+) Teeth Intact, Dental Advisory Given   Pulmonary sleep apnea and Continuous Positive Airway Pressure Ventilation ,    breath sounds clear to auscultation       Cardiovascular hypertension, Pt. on home beta blockers + Peripheral Vascular Disease  + dysrhythmias Atrial Fibrillation  Rhythm:Regular Rate:Normal     Neuro/Psych  Headaches, Anxiety    GI/Hepatic Neg liver ROS, GERD  ,  Endo/Other  negative endocrine ROS  Renal/GU negative Renal ROS     Musculoskeletal  (+) Arthritis ,   Abdominal Normal abdominal exam  (+)   Peds  Hematology negative hematology ROS (+)   Anesthesia Other Findings   Reproductive/Obstetrics                            Anesthesia Physical Anesthesia Plan  ASA: III  Anesthesia Plan: General   Post-op Pain Management:    Induction: Intravenous  PONV Risk Score and Plan: 3 and Ondansetron, Dexamethasone and Midazolam  Airway Management Planned: Oral ETT  Additional Equipment: None  Intra-op Plan:   Post-operative Plan: Extubation in OR  Informed Consent: I have reviewed the patients History and Physical, chart, labs and discussed the procedure including the risks, benefits and alternatives for the proposed anesthesia with the patient or authorized representative who has indicated his/her understanding and acceptance.       Plan Discussed with: CRNA  Anesthesia Plan Comments: (2017 Echo:  - Left ventricle: Systolic function was normal. The estimated  ejection fraction was in the range of 50% to 55%. The study is  not technically sufficient to allow evaluation of LV diastolic  function.  -  Left atrium: The appendage was moderately dilated.  - Atrial septum: No defect or patent foramen ovale was identified. )       Anesthesia Quick Evaluation

## 2020-04-11 NOTE — Anesthesia Procedure Notes (Signed)
Procedure Name: Intubation Date/Time: 04/11/2020 11:35 AM Performed by: Valda Favia, CRNA Pre-anesthesia Checklist: Patient identified, Suction available, Emergency Drugs available and Patient being monitored Patient Re-evaluated:Patient Re-evaluated prior to induction Oxygen Delivery Method: Circle system utilized Preoxygenation: Pre-oxygenation with 100% oxygen Induction Type: IV induction Ventilation: Mask ventilation without difficulty and Oral airway inserted - appropriate to patient size Laryngoscope Size: Mac and 4 Grade View: Grade I Tube type: Oral Tube size: 7.0 mm Number of attempts: 1 Airway Equipment and Method: Stylet Placement Confirmation: ETT inserted through vocal cords under direct vision,  positive ETCO2 and breath sounds checked- equal and bilateral Secured at: 23 cm Tube secured with: Tape Dental Injury: Teeth and Oropharynx as per pre-operative assessment

## 2020-04-14 ENCOUNTER — Encounter (HOSPITAL_COMMUNITY): Payer: Self-pay | Admitting: Cardiology

## 2020-04-14 MED FILL — Adenosine IV Soln 6 MG/2ML: INTRAVENOUS | Qty: 4 | Status: AC

## 2020-05-09 ENCOUNTER — Encounter (HOSPITAL_COMMUNITY): Payer: Self-pay | Admitting: Nurse Practitioner

## 2020-05-09 ENCOUNTER — Other Ambulatory Visit: Payer: Self-pay

## 2020-05-09 ENCOUNTER — Ambulatory Visit (HOSPITAL_COMMUNITY)
Admission: RE | Admit: 2020-05-09 | Discharge: 2020-05-09 | Disposition: A | Discharge status: Home / Self Care | Payer: PPO | Source: Ambulatory Visit | Attending: Nurse Practitioner | Admitting: Nurse Practitioner

## 2020-05-09 VITALS — BP 130/90 | HR 84 | Ht 73.0 in | Wt 289.4 lb

## 2020-05-09 DIAGNOSIS — D6869 Other thrombophilia: Secondary | ICD-10-CM

## 2020-05-09 DIAGNOSIS — I4819 Other persistent atrial fibrillation: Secondary | ICD-10-CM | POA: Insufficient documentation

## 2020-05-09 DIAGNOSIS — I4510 Unspecified right bundle-branch block: Secondary | ICD-10-CM | POA: Insufficient documentation

## 2020-05-09 DIAGNOSIS — Z7901 Long term (current) use of anticoagulants: Secondary | ICD-10-CM | POA: Insufficient documentation

## 2020-05-09 NOTE — Progress Notes (Signed)
Laurena Bering   Primary Care Physician: Gaynelle Arabian, MD Referring Physician: Dr. Gloris Ham Deborah [name] is a 64 y.o. female with a h/o persistent afib that is in the afib clinic for f/u on    ablation one month ago.  She has had 2 ablations in the past and several cardioversion's in the past. She remains on  Propafenone  325 mg bid.  She reports that she has not noted any afib since the procedure. She feels well. She is surprised to find out she is in afib at 84 bpm.  No swallowing or groin issues since the procedure. She  did have a fall yesterday, fell backward from a squatting position,  and is questioning if that could have thrown her out of rhythm.   Today, she denies symptoms of palpitations, chest pain, shortness of breath, orthopnea, PND, lower extremity edema, dizziness, presyncope, syncope, or neurologic sequela. The patient is tolerating medications without difficulties and is otherwise without complaint today.   Past Medical History:  Diagnosis Date  . A-fib ([place])   . Anemia    hx   . Anxiety   . Arthritis    "was in right knee" (01/25/2018)  . Dysrhythmia    atrial fib/flutter  . GERD (gastroesophageal reflux disease)    occ  . HTN (hypertension)   . Migraine    "used to have them more frequently before menopause; now only have a couple/yr" (01/25/2018)  . Obesity   . OSA on CPAP   . PAF (paroxysmal atrial fibrillation) ([place])    s/p DCCV 2010  . Peripheral vascular disease ([place]) 2016   after flying in leg   Past Surgical History:  Procedure Laterality Date  . ATRIAL FIBRILLATION ABLATION N/A 01/25/2018   Procedure: ATRIAL FIBRILLATION ABLATION;  Surgeon: Constance Haw, MD;  Location: [facility];  Service: Cardiovascular;  Laterality: N/A;  . ATRIAL FIBRILLATION ABLATION N/A 04/11/2020   Procedure: ATRIAL FIBRILLATION ABLATION;  Surgeon: Constance Haw, MD;  Location: [facility];  Service: Cardiovascular;  Laterality: N/A;  . CARDIAC  CATHETERIZATION  2005  . CARDIOVERSION N/A 12/09/2017   Procedure: CARDIOVERSION;  Surgeon: Sanda Klein, MD;  Location: [facility]OSCOPY;  Service: Cardiovascular;  Laterality: N/A;  . CARDIOVERSION N/A 05/09/2018   Procedure: CARDIOVERSION;  Surgeon: Josue Hector, MD;  Location: [facility] ENDOSCOPY;  Service: Cardiovascular;  Laterality: N/A;  . CARDIOVERSION N/A 01/03/2019   Procedure: CARDIOVERSION;  Surgeon: Acie Fredrickson Wonda Cheng, MD;  Location: [facility];  Service: Cardiovascular;  Laterality: N/A;  . CATARACT EXTRACTION W/ INTRAOCULAR LENS IMPLANT Left 2016  . ELECTROPHYSIOLOGIC STUDY N/A 12/04/2015   Procedure: Atrial Fibrillation Ablation;  Surgeon: Will Meredith Leeds, MD;  Location: [place] [facility] CV LAB;  Service: Cardiovascular;  Laterality: N/A;  . JOINT REPLACEMENT    . TONSILLECTOMY AND ADENOIDECTOMY    . TOTAL KNEE ARTHROPLASTY Right 04/23/2016   Procedure: TOTAL KNEE ARTHROPLASTY;  Surgeon: [name], MD;  Location: [facility];  Service: Orthopedics;  Laterality: Right;  . TUBAL LIGATION      Current Outpatient Medications  Medication Sig Dispense Refill  . acetaminophen (TYLENOL) 500 MG tablet Take 1,000 mg by mouth every 6 (six) hours as needed for moderate pain or headache.    . ALPRAZolam (XANAX) 0.5 MG tablet Take 0.25-0.5 mg by mouth 2 (two) times daily as needed for anxiety.     . calcium carbonate (TUMS - DOSED IN MG ELEMENTAL CALCIUM) 500 MG chewable tablet Chew 3 tablets  by mouth daily as needed for indigestion or heartburn.     . Cholecalciferol (VITAMIN D3) 50 MCG (2000 UT) TABS Take 2,000 Units by mouth daily.    Marland Kitchen ELIQUIS 5 MG TABS tablet TAKE 1 TABLET BY MOUTH TWICE A DAY (Patient taking differently: Take 5 mg by mouth 2 (two) times daily. ) 180 tablet 1  . escitalopram (LEXAPRO) 10 MG tablet Take 10 mg by mouth every evening.   3  . metoprolol succinate (TOPROL-XL) 50 MG 24 hr tablet TAKE 1 TABLET BY MOUTH DAILY. TAKE WITH OR IMMEDIATELY FOLLOWING A MEAL. (Patient taking  differently: Take 50 mg by mouth daily. TAKE 1 TABLET BY MOUTH DAILY. TAKE WITH OR IMMEDIATELY FOLLOWING A MEAL.) 90 tablet 2  . Polyethyl Glycol-Propyl Glycol (SYSTANE) 0.4-0.3 % SOLN Place 1 drop into both eyes 3 (three) times daily as needed (dry/irritated eyes.).    Marland Kitchen propafenone (RYTHMOL SR) 325 MG 12 hr capsule Take 1 capsule (325 mg total) by mouth 2 (two) times daily. 180 capsule 2  . vitamin B-12 (CYANOCOBALAMIN) 1000 MCG tablet Take 1,000 mcg by mouth daily.     No current facility-administered medications for this encounter.    Allergies  Allergen Reactions  . Benazepril Cough  . Adhesive [Tape] Rash and Other (See Comments)    bandaides  . Sulfa Antibiotics Rash    Social History   Socioeconomic History  . Marital status: Married    Spouse name: Not on file  . Number of children: Not on file  . Years of education: Not on file  . Highest education level: Not on file  Occupational History  . Not on file  Tobacco Use  . Smoking status: Never Smoker  . Smokeless tobacco: Never Used  Vaping Use  . Vaping Use: Never used  Substance and Sexual Activity  . Alcohol use: Yes    Alcohol/week: 0.0 standard drinks    Comment: 01/25/2018 "couple drinks/month; if that"  . Drug use: Never  . Sexual activity: Yes  Other Topics Concern  . Not on file  Social History Narrative  . Not on file   Social Determinants of Health   Financial Resource Strain:   . Difficulty of Paying Living Expenses: Not on file  Food Insecurity:   . Worried About Charity fundraiser in the Last Year: Not on file  . Ran Out of Food in the Last Year: Not on file  Transportation Needs:   . Lack of Transportation (Medical): Not on file  . Lack of Transportation (Non-Medical): Not on file  Physical Activity:   . Days of Exercise per Week: Not on file  . Minutes of Exercise per Session: Not on file  Stress:   . Feeling of Stress : Not on file  Social Connections:   . Frequency of Communication  with Friends and Family: Not on file  . Frequency of Social Gatherings with Friends and Family: Not on file  . Attends Religious Services: Not on file  . Active Member of Clubs or Organizations: Not on file  . Attends Archivist Meetings: Not on file  . Marital Status: Not on file  Intimate Partner Violence:   . Fear of Current or Ex-Partner: Not on file  . Emotionally Abused: Not on file  . Physically Abused: Not on file  . Sexually Abused: Not on file    Family History  Problem Relation Age of Onset  . Heart disease Mother   . Stomach cancer Paternal Grandfather   .  Colon cancer Neg Hx     ROS- All systems are reviewed and negative except as per the HPI above  Physical Exam: Vitals:   05/09/20 1059  Weight: 131.3 kg  Height: 6\' 1"  (1.854 m)   Wt Readings from Last 3 Encounters:  05/09/20 131.3 kg  04/11/20 127 kg  02/19/20 135.7 kg    Labs: Lab Results  Component Value Date   NA 143 04/02/2020   K 4.9 04/02/2020   CL 107 (H) 04/02/2020   CO2 25 04/02/2020   GLUCOSE 84 04/02/2020   BUN 12 04/02/2020   CREATININE 1.20 (H) 04/02/2020   CALCIUM 9.2 04/02/2020   Lab Results  Component Value Date   INR WILL FOLLOW 09/23/2016   INR 1.0 09/23/2016   No results found for: CHOL, HDL, LDLCALC, TRIG   GEN- The patient is well appearing, alert and oriented x 3 today.   Head- normocephalic, atraumatic Eyes-  Sclera clear, conjunctiva pink Ears- hearing intact Oropharynx- clear Neck- supple, no JVP Lymph- no cervical lymphadenopathy Lungs- Clear to ausculation bilaterally, normal work of breathing Heart-irregular rate and rhythm, no murmurs, rubs or gallops, PMI not laterally displaced GI- soft, NT, ND, + BS Extremities- no clubbing, cyanosis, or edema MS- no significant deformity or atrophy Skin- no rash or lesion Psych- euthymic mood, full affect Neuro- strength and sensation are intact   EKG-afib at 84 bpm, qrs int 178 ms, qtc 503 ms  Epic  records reviewed  Assessment and Plan: 1. Persistent afib S/p  3rd ablation, unaware that she was out of rhythm today  She feels well  Continue metoprolol succinate  50 mg daily  Continue propafenone 325 mg bid  2. Chadsvasc score of 2 Continue eliquis 5 mg bid, states no missed doses    F/u next week for EKG, if still out of rhythm will plan for DCCV She will also try to track heart rhythm with BP cuff until I see her back next week to help determine paroxysmal vrs persistent afib   Butch Penny C. Jeremia Groot, [facility] [address] [place], [place] [address] [phone]

## 2020-05-14 ENCOUNTER — Ambulatory Visit (HOSPITAL_COMMUNITY)
Admission: RE | Admit: 2020-05-14 | Discharge: 2020-05-14 | Disposition: A | Discharge status: Home / Self Care | Payer: PPO | Source: Ambulatory Visit | Attending: Nurse Practitioner | Admitting: Nurse Practitioner

## 2020-05-14 ENCOUNTER — Encounter (HOSPITAL_COMMUNITY): Payer: Self-pay | Admitting: Nurse Practitioner

## 2020-05-14 ENCOUNTER — Other Ambulatory Visit: Payer: Self-pay

## 2020-05-14 VITALS — BP 130/86 | HR 106 | Ht 73.0 in | Wt 288.2 lb

## 2020-05-14 DIAGNOSIS — Z79899 Other long term (current) drug therapy: Secondary | ICD-10-CM | POA: Insufficient documentation

## 2020-05-14 DIAGNOSIS — Z7901 Long term (current) use of anticoagulants: Secondary | ICD-10-CM | POA: Insufficient documentation

## 2020-05-14 DIAGNOSIS — Z882 Allergy status to sulfonamides status: Secondary | ICD-10-CM | POA: Insufficient documentation

## 2020-05-14 DIAGNOSIS — I4819 Other persistent atrial fibrillation: Secondary | ICD-10-CM | POA: Insufficient documentation

## 2020-05-14 DIAGNOSIS — D6869 Other thrombophilia: Secondary | ICD-10-CM

## 2020-05-14 DIAGNOSIS — Z888 Allergy status to other drugs, medicaments and biological substances status: Secondary | ICD-10-CM | POA: Diagnosis not present

## 2020-05-14 DIAGNOSIS — Z8249 Family history of ischemic heart disease and other diseases of the circulatory system: Secondary | ICD-10-CM | POA: Diagnosis not present

## 2020-05-14 LAB — BASIC METABOLIC PANEL
Anion gap: 9 (ref 5–15)
BUN: 12 mg/dL (ref 8–23)
CO2: 24 mmol/L (ref 22–32)
Calcium: 9.2 mg/dL (ref 8.9–10.3)
Chloride: 106 mmol/L (ref 98–111)
Creatinine, Ser: 1.12 mg/dL — ABNORMAL HIGH (ref 0.44–1.00)
GFR, Estimated: 55 mL/min — ABNORMAL LOW (ref >60)
Glucose, Bld: 125 mg/dL — ABNORMAL HIGH (ref 70–99)
Potassium: 4.4 mmol/L (ref 3.5–5.1)
Sodium: 139 mmol/L (ref 135–145)

## 2020-05-14 LAB — CBC
HCT: 45 % (ref 36.0–46.0)
Hemoglobin: 13.4 g/dL (ref 12.0–15.0)
MCH: 27.2 pg (ref 26.0–34.0)
MCHC: 29.8 g/dL — ABNORMAL LOW (ref 30.0–36.0)
MCV: 91.3 fL (ref 80.0–100.0)
Platelets: 236 10*3/uL (ref 150–400)
RBC: 4.93 MIL/uL (ref 3.87–5.11)
RDW: 15.6 % — ABNORMAL HIGH (ref 11.5–15.5)
WBC: 5.7 10*3/uL (ref 4.0–10.5)
nRBC: 0 % (ref 0.0–0.2)

## 2020-05-14 NOTE — Patient Instructions (Signed)
Cardioversion scheduled for Tuesday, November 30th  - Arrive at the [facility] and go to admitting at [facility] not eat or drink anything after midnight the night prior to your procedure.  - Take all your morning medication (except diabetic medications) with a sip of water prior to arrival.  - You will not be able to drive home after your procedure.  - Do NOT miss any doses of your blood thinner - if you should miss a dose please notify our office immediately.  - If you feel as if you go back into normal rhythm prior to scheduled cardioversion, please notify our office immediately. If your procedure is canceled in the cardioversion suite you will be charged a cancellation fee.

## 2020-05-14 NOTE — H&P (View-Only) (Signed)
Laurena Bering   Primary Care Physician: Gaynelle Arabian, MD Referring Physician: Dr. Gloris Ham Deborah [name] is a 64 y.o. female with a h/o persistent afib that is in the afib clinic for f/u on    ablation one month ago.  She has had 2 ablations in the past and several cardioversion's in the past. She remains on  Propafenone  325 mg bid.  She reports that she has not noted any afib since the procedure. She feels well. She is surprised to find out she is in afib at 84 bpm.  No swallowing or groin issues since the procedure. She  did have a fall yesterday, fell backward from a squatting position,  and is questioning if that could have thrown her out of rhythm.   F/u in afib clinic, 11/17. Ekg shows that pt continues with afib . She has noted taking her BP/HR issues since last visit,  that the pulse indicator light has been showing an irregular pulse so I feel that she is in persistent afib. I discussed a cardioversion and she is in agreement. No missed anticoagulation.   Today, she denies symptoms of palpitations, chest pain, shortness of breath, orthopnea, PND, lower extremity edema, dizziness, presyncope, syncope, or neurologic sequela. The patient is tolerating medications without difficulties and is otherwise without complaint today.   Past Medical History:  Diagnosis Date  . A-fib ([place])   . Anemia    hx   . Anxiety   . Arthritis    "was in right knee" (01/25/2018)  . Dysrhythmia    atrial fib/flutter  . GERD (gastroesophageal reflux disease)    occ  . HTN (hypertension)   . Migraine    "used to have them more frequently before menopause; now only have a couple/yr" (01/25/2018)  . Obesity   . OSA on CPAP   . PAF (paroxysmal atrial fibrillation) ([place])    s/p DCCV 2010  . Peripheral vascular disease ([place]) 2016   after flying in leg   Past Surgical History:  Procedure Laterality Date  . ATRIAL FIBRILLATION ABLATION N/A 01/25/2018   Procedure: ATRIAL FIBRILLATION ABLATION;  Surgeon: Constance Haw, MD;  Location: [place] [facility];  Service: Cardiovascular;  Laterality: N/A;  . ATRIAL FIBRILLATION ABLATION N/A 04/11/2020   Procedure: ATRIAL FIBRILLATION ABLATION;  Surgeon: Constance Haw, MD;  Location: [place] [facility];  Service: Cardiovascular;  Laterality: N/A;  . CARDIAC CATHETERIZATION  2005  . CARDIOVERSION N/A 12/09/2017   Procedure: CARDIOVERSION;  Surgeon: Sanda Klein, MD;  Location: [place] [facility];  Service: Cardiovascular;  Laterality: N/A;  . CARDIOVERSION N/A 05/09/2018   Procedure: CARDIOVERSION;  Surgeon: Josue Hector, MD;  Location: [facility];  Service: Cardiovascular;  Laterality: N/A;  . CARDIOVERSION N/A 01/03/2019   Procedure: CARDIOVERSION;  Surgeon: Acie Fredrickson Wonda Cheng, MD;  Location: [place];  Service: Cardiovascular;  Laterality: N/A;  . CATARACT EXTRACTION W/ INTRAOCULAR LENS IMPLANT Left 2016  . ELECTROPHYSIOLOGIC STUDY N/A 12/04/2015   Procedure: Atrial Fibrillation Ablation;  Surgeon: Will Meredith Leeds, MD;  Location: [place] [facility];  Service: Cardiovascular;  Laterality: N/A;  . JOINT REPLACEMENT    . TONSILLECTOMY AND ADENOIDECTOMY    . TOTAL KNEE ARTHROPLASTY Right 04/23/2016   Procedure: TOTAL KNEE ARTHROPLASTY;  Surgeon: [name], MD;  Location: [place];  Service: Orthopedics;  Laterality: Right;  . TUBAL LIGATION      Current Outpatient Medications  Medication Sig Dispense Refill  . acetaminophen (TYLENOL) 500 MG tablet Take  1,000 mg by mouth every 6 (six) hours as needed for moderate pain or headache.    . ALPRAZolam (XANAX) 0.5 MG tablet Take 0.25-0.5 mg by mouth 2 (two) times daily as needed for anxiety.     . calcium carbonate (TUMS - DOSED IN MG ELEMENTAL CALCIUM) 500 MG chewable tablet Chew 3 tablets by mouth daily as needed for indigestion or heartburn.     . Cholecalciferol (VITAMIN D3) 50 MCG (2000 UT) TABS Take 2,000 Units by mouth daily.    Marland Kitchen ELIQUIS 5 MG TABS tablet TAKE 1 TABLET BY MOUTH TWICE A DAY  (Patient taking differently: Take 5 mg by mouth 2 (two) times daily. ) 180 tablet 1  . escitalopram (LEXAPRO) 10 MG tablet Take 10 mg by mouth every evening.   3  . metoprolol succinate (TOPROL-XL) 50 MG 24 hr tablet TAKE 1 TABLET BY MOUTH DAILY. TAKE WITH OR IMMEDIATELY FOLLOWING A MEAL. (Patient taking differently: Take 50 mg by mouth daily. TAKE 1 TABLET BY MOUTH DAILY. TAKE WITH OR IMMEDIATELY FOLLOWING A MEAL.) 90 tablet 2  . Polyethyl Glycol-Propyl Glycol (SYSTANE) 0.4-0.3 % SOLN Place 1 drop into both eyes 3 (three) times daily as needed (dry/irritated eyes.).    Marland Kitchen propafenone (RYTHMOL SR) 325 MG 12 hr capsule Take 1 capsule (325 mg total) by mouth 2 (two) times daily. 180 capsule 2  . vitamin B-12 (CYANOCOBALAMIN) 1000 MCG tablet Take 1,000 mcg by mouth daily.     No current facility-administered medications for this encounter.    Allergies  Allergen Reactions  . Benazepril Cough  . Adhesive [Tape] Rash and Other (See Comments)    bandaides  . Sulfa Antibiotics Rash    Social History   Socioeconomic History  . Marital status: Married    Spouse name: Not on file  . Number of children: Not on file  . Years of education: Not on file  . Highest education level: Not on file  Occupational History  . Not on file  Tobacco Use  . Smoking status: Never Smoker  . Smokeless tobacco: Never Used  Vaping Use  . Vaping Use: Never used  Substance and Sexual Activity  . Alcohol use: Yes    Alcohol/week: 0.0 standard drinks    Comment: 01/25/2018 "couple drinks/month; if that"  . Drug use: Never  . Sexual activity: Yes  Other Topics Concern  . Not on file  Social History Narrative  . Not on file   Social Determinants of Health   Financial Resource Strain:   . Difficulty of Paying Living Expenses: Not on file  Food Insecurity:   . Worried About Charity fundraiser in the Last Year: Not on file  . Ran Out of Food in the Last Year: Not on file  Transportation Needs:   . Lack of  Transportation (Medical): Not on file  . Lack of Transportation (Non-Medical): Not on file  Physical Activity:   . Days of Exercise per Week: Not on file  . Minutes of Exercise per Session: Not on file  Stress:   . Feeling of Stress : Not on file  Social Connections:   . Frequency of Communication with Friends and Family: Not on file  . Frequency of Social Gatherings with Friends and Family: Not on file  . Attends Religious Services: Not on file  . Active Member of Clubs or Organizations: Not on file  . Attends Archivist Meetings: Not on file  . Marital Status: Not on file  Intimate Partner Violence:   . Fear of Current or Ex-Partner: Not on file  . Emotionally Abused: Not on file  . Physically Abused: Not on file  . Sexually Abused: Not on file    Family History  Problem Relation Age of Onset  . Heart disease Mother   . Stomach cancer Paternal Grandfather   . Colon cancer Neg Hx     ROS- All systems are reviewed and negative except as per the HPI above  Physical Exam: Vitals:   05/14/20 1122  BP: 130/86  Pulse: (!) 106  Weight: 130.7 kg  Height: 6\' 1"  (1.854 m)   Wt Readings from Last 3 Encounters:  05/14/20 130.7 kg  05/09/20 131.3 kg  04/11/20 127 kg    Labs: Lab Results  Component Value Date   NA 143 04/02/2020   K 4.9 04/02/2020   CL 107 (H) 04/02/2020   CO2 25 04/02/2020   GLUCOSE 84 04/02/2020   BUN 12 04/02/2020   CREATININE 1.20 (H) 04/02/2020   CALCIUM 9.2 04/02/2020   Lab Results  Component Value Date   INR WILL FOLLOW 09/23/2016   INR 1.0 09/23/2016   No results found for: CHOL, HDL, LDLCALC, TRIG   GEN- The patient is well appearing, alert and oriented x 3 today.   Head- normocephalic, atraumatic Eyes-  Sclera clear, conjunctiva pink Ears- hearing intact Oropharynx- clear Neck- supple, no JVP Lymph- no cervical lymphadenopathy Lungs- Clear to ausculation bilaterally, normal work of breathing Heart-irregular rate and  rhythm, no murmurs, rubs or gallops, PMI not laterally displaced GI- soft, NT, ND, + BS Extremities- no clubbing, cyanosis, or edema MS- no significant deformity or atrophy Skin- no rash or lesion Psych- euthymic mood, full affect Neuro- strength and sensation are intact   EKG-afib at 106  bpm, qrs int 174 ms, qtc 464  ms  Epic records reviewed  Assessment and Plan: 1. Persistent afib S/p  3rd ablation one month ago , unaware that she was out of rhythm  She feels well, unaware of rhythm  Will schedule a cardioversion, she  is in agreement  Continue metoprolol succinate  50 mg daily  Continue propafenone 325 mg bid Bnmet/cbc/covid today States both covid vaccines plus booster   2. Chadsvasc score of 2 Continue eliquis 5 mg bid, states no missed doses for at lest 3 weeks   F/u here in one week after cardioversion   Butch Penny C. Alijah Akram, [facility] [address] [place], [place] [address] [phone]

## 2020-05-14 NOTE — Progress Notes (Signed)
Laurena Bering   Primary Care Physician: Gaynelle Arabian, MD Referring Physician: Dr. Gloris Ham CHARAE DEPAOLIS is a 64 y.o. female with a h/o persistent afib that is in the afib clinic for f/u on    ablation one month ago.  She has had 2 ablations in the past and several cardioversion's in the past. She remains on  Propafenone  325 mg bid.  She reports that she has not noted any afib since the procedure. She feels well. She is surprised to find out she is in afib at 84 bpm.  No swallowing or groin issues since the procedure. She  did have a fall yesterday, fell backward from a squatting position,  and is questioning if that could have thrown her out of rhythm.   F/u in afib clinic, 11/17. Ekg shows that pt continues with afib . She has noted taking her BP/HR issues since last visit,  that the pulse indicator light has been showing an irregular pulse so I feel that she is in persistent afib. I discussed a cardioversion and she is in agreement. No missed anticoagulation.   Today, she denies symptoms of palpitations, chest pain, shortness of breath, orthopnea, PND, lower extremity edema, dizziness, presyncope, syncope, or neurologic sequela. The patient is tolerating medications without difficulties and is otherwise without complaint today.   Past Medical History:  Diagnosis Date  . A-fib ([place])   . Anemia    hx   . Anxiety   . Arthritis    "was in right knee" (01/25/2018)  . Dysrhythmia    atrial fib/flutter  . GERD (gastroesophageal reflux disease)    occ  . HTN (hypertension)   . Migraine    "used to have them more frequently before menopause; now only have a couple/yr" (01/25/2018)  . Obesity   . OSA on CPAP   . PAF (paroxysmal atrial fibrillation) ([place])    s/p DCCV 2010  . Peripheral vascular disease ([place]) 2016   after flying in leg   Past Surgical History:  Procedure Laterality Date  . ATRIAL FIBRILLATION ABLATION N/A 01/25/2018   Procedure: ATRIAL FIBRILLATION ABLATION;  Surgeon: Constance Haw, MD;  Location: [place] [facility];  Service: Cardiovascular;  Laterality: N/A;  . ATRIAL FIBRILLATION ABLATION N/A 04/11/2020   Procedure: ATRIAL FIBRILLATION ABLATION;  Surgeon: Constance Haw, MD;  Location: [place] [facility];  Service: Cardiovascular;  Laterality: N/A;  . CARDIAC CATHETERIZATION  2005  . CARDIOVERSION N/A 12/09/2017   Procedure: CARDIOVERSION;  Surgeon: Sanda Klein, MD;  Location: [place] [facility];  Service: Cardiovascular;  Laterality: N/A;  . CARDIOVERSION N/A 05/09/2018   Procedure: CARDIOVERSION;  Surgeon: Josue Hector, MD;  Location: [facility];  Service: Cardiovascular;  Laterality: N/A;  . CARDIOVERSION N/A 01/03/2019   Procedure: CARDIOVERSION;  Surgeon: Acie Fredrickson Wonda Cheng, MD;  Location: [place];  Service: Cardiovascular;  Laterality: N/A;  . CATARACT EXTRACTION W/ INTRAOCULAR LENS IMPLANT Left 2016  . ELECTROPHYSIOLOGIC STUDY N/A 12/04/2015   Procedure: Atrial Fibrillation Ablation;  Surgeon: Will Meredith Leeds, MD;  Location: [place] [facility];  Service: Cardiovascular;  Laterality: N/A;  . JOINT REPLACEMENT    . TONSILLECTOMY AND ADENOIDECTOMY    . TOTAL KNEE ARTHROPLASTY Right 04/23/2016   Procedure: TOTAL KNEE ARTHROPLASTY;  Surgeon: [name], MD;  Location: [place];  Service: Orthopedics;  Laterality: Right;  . TUBAL LIGATION      Current Outpatient Medications  Medication Sig Dispense Refill  . acetaminophen (TYLENOL) 500 MG tablet Take  1,000 mg by mouth every 6 (six) hours as needed for moderate pain or headache.    . ALPRAZolam (XANAX) 0.5 MG tablet Take 0.25-0.5 mg by mouth 2 (two) times daily as needed for anxiety.     . calcium carbonate (TUMS - DOSED IN MG ELEMENTAL CALCIUM) 500 MG chewable tablet Chew 3 tablets by mouth daily as needed for indigestion or heartburn.     . Cholecalciferol (VITAMIN D3) 50 MCG (2000 UT) TABS Take 2,000 Units by mouth daily.    Marland Kitchen ELIQUIS 5 MG TABS tablet TAKE 1 TABLET BY MOUTH TWICE A DAY  (Patient taking differently: Take 5 mg by mouth 2 (two) times daily. ) 180 tablet 1  . escitalopram (LEXAPRO) 10 MG tablet Take 10 mg by mouth every evening.   3  . metoprolol succinate (TOPROL-XL) 50 MG 24 hr tablet TAKE 1 TABLET BY MOUTH DAILY. TAKE WITH OR IMMEDIATELY FOLLOWING A MEAL. (Patient taking differently: Take 50 mg by mouth daily. TAKE 1 TABLET BY MOUTH DAILY. TAKE WITH OR IMMEDIATELY FOLLOWING A MEAL.) 90 tablet 2  . Polyethyl Glycol-Propyl Glycol (SYSTANE) 0.4-0.3 % SOLN Place 1 drop into both eyes 3 (three) times daily as needed (dry/irritated eyes.).    Marland Kitchen propafenone (RYTHMOL SR) 325 MG 12 hr capsule Take 1 capsule (325 mg total) by mouth 2 (two) times daily. 180 capsule 2  . vitamin B-12 (CYANOCOBALAMIN) 1000 MCG tablet Take 1,000 mcg by mouth daily.     No current facility-administered medications for this encounter.    Allergies  Allergen Reactions  . Benazepril Cough  . Adhesive [Tape] Rash and Other (See Comments)    bandaides  . Sulfa Antibiotics Rash    Social History   Socioeconomic History  . Marital status: Married    Spouse name: Not on file  . Number of children: Not on file  . Years of education: Not on file  . Highest education level: Not on file  Occupational History  . Not on file  Tobacco Use  . Smoking status: Never Smoker  . Smokeless tobacco: Never Used  Vaping Use  . Vaping Use: Never used  Substance and Sexual Activity  . Alcohol use: Yes    Alcohol/week: 0.0 standard drinks    Comment: 01/25/2018 "couple drinks/month; if that"  . Drug use: Never  . Sexual activity: Yes  Other Topics Concern  . Not on file  Social History Narrative  . Not on file   Social Determinants of Health   Financial Resource Strain:   . Difficulty of Paying Living Expenses: Not on file  Food Insecurity:   . Worried About Charity fundraiser in the Last Year: Not on file  . Ran Out of Food in the Last Year: Not on file  Transportation Needs:   . Lack of  Transportation (Medical): Not on file  . Lack of Transportation (Non-Medical): Not on file  Physical Activity:   . Days of Exercise per Week: Not on file  . Minutes of Exercise per Session: Not on file  Stress:   . Feeling of Stress : Not on file  Social Connections:   . Frequency of Communication with Friends and Family: Not on file  . Frequency of Social Gatherings with Friends and Family: Not on file  . Attends Religious Services: Not on file  . Active Member of Clubs or Organizations: Not on file  . Attends Archivist Meetings: Not on file  . Marital Status: Not on file  Intimate Partner Violence:   . Fear of Current or Ex-Partner: Not on file  . Emotionally Abused: Not on file  . Physically Abused: Not on file  . Sexually Abused: Not on file    Family History  Problem Relation Age of Onset  . Heart disease Mother   . Stomach cancer Paternal Grandfather   . Colon cancer Neg Hx     ROS- All systems are reviewed and negative except as per the HPI above  Physical Exam: Vitals:   05/14/20 1122  BP: 130/86  Pulse: (!) 106  Weight: 130.7 kg  Height: 6\' 1"  (1.854 m)   Wt Readings from Last 3 Encounters:  05/14/20 130.7 kg  05/09/20 131.3 kg  04/11/20 127 kg    Labs: Lab Results  Component Value Date   NA 143 04/02/2020   K 4.9 04/02/2020   CL 107 (H) 04/02/2020   CO2 25 04/02/2020   GLUCOSE 84 04/02/2020   BUN 12 04/02/2020   CREATININE 1.20 (H) 04/02/2020   CALCIUM 9.2 04/02/2020   Lab Results  Component Value Date   INR WILL FOLLOW 09/23/2016   INR 1.0 09/23/2016   No results found for: CHOL, HDL, LDLCALC, TRIG   GEN- The patient is well appearing, alert and oriented x 3 today.   Head- normocephalic, atraumatic Eyes-  Sclera clear, conjunctiva pink Ears- hearing intact Oropharynx- clear Neck- supple, no JVP Lymph- no cervical lymphadenopathy Lungs- Clear to ausculation bilaterally, normal work of breathing Heart-irregular rate and  rhythm, no murmurs, rubs or gallops, PMI not laterally displaced GI- soft, NT, ND, + BS Extremities- no clubbing, cyanosis, or edema MS- no significant deformity or atrophy Skin- no rash or lesion Psych- euthymic mood, full affect Neuro- strength and sensation are intact   EKG-afib at 106  bpm, qrs int 174 ms, qtc 464  ms  Epic records reviewed  Assessment and Plan: 1. Persistent afib S/p  3rd ablation one month ago , unaware that she was out of rhythm  She feels well, unaware of rhythm  Will schedule a cardioversion, she  is in agreement  Continue metoprolol succinate  50 mg daily  Continue propafenone 325 mg bid Bnmet/cbc/covid today States both covid vaccines plus booster   2. Chadsvasc score of 2 Continue eliquis 5 mg bid, states no missed doses for at lest 3 weeks   F/u here in one week after cardioversion   Butch Penny C. Georgio Hattabaugh, [facility] [address] [place], [place] [address] [phone]

## 2020-05-24 ENCOUNTER — Other Ambulatory Visit (HOSPITAL_COMMUNITY): Payer: PPO

## 2020-05-26 ENCOUNTER — Other Ambulatory Visit (HOSPITAL_COMMUNITY)
Admission: RE | Admit: 2020-05-26 | Discharge: 2020-05-26 | Disposition: A | Discharge status: Home / Self Care | Payer: PPO | Source: Ambulatory Visit | Attending: Cardiovascular Disease | Admitting: Cardiovascular Disease

## 2020-05-26 ENCOUNTER — Other Ambulatory Visit: Payer: Self-pay | Admitting: Cardiology

## 2020-05-26 DIAGNOSIS — Z20822 Contact with and (suspected) exposure to covid-19: Secondary | ICD-10-CM | POA: Insufficient documentation

## 2020-05-26 DIAGNOSIS — Z01812 Encounter for preprocedural laboratory examination: Secondary | ICD-10-CM | POA: Diagnosis not present

## 2020-05-26 LAB — SARS CORONAVIRUS 2 (TAT 6-24 HRS): SARS Coronavirus 2: NEGATIVE

## 2020-05-27 ENCOUNTER — Encounter (HOSPITAL_COMMUNITY): Payer: Self-pay | Admitting: Cardiovascular Disease

## 2020-05-27 ENCOUNTER — Ambulatory Visit: Payer: PPO | Admitting: Cardiology

## 2020-05-27 ENCOUNTER — Ambulatory Visit (HOSPITAL_COMMUNITY): Payer: PPO | Admitting: Certified Registered Nurse Anesthetist

## 2020-05-27 ENCOUNTER — Other Ambulatory Visit: Payer: Self-pay

## 2020-05-27 ENCOUNTER — Encounter (HOSPITAL_COMMUNITY)
Admission: RE | Disposition: A | Discharge status: Home / Self Care | Payer: Self-pay | Source: Home / Self Care | Attending: Cardiovascular Disease

## 2020-05-27 ENCOUNTER — Ambulatory Visit (HOSPITAL_COMMUNITY)
Admission: RE | Admit: 2020-05-27 | Discharge: 2020-05-27 | Disposition: A | Discharge status: Home / Self Care | Payer: PPO | Attending: Cardiovascular Disease | Admitting: Cardiovascular Disease

## 2020-05-27 DIAGNOSIS — G4733 Obstructive sleep apnea (adult) (pediatric): Secondary | ICD-10-CM | POA: Diagnosis not present

## 2020-05-27 DIAGNOSIS — Z7901 Long term (current) use of anticoagulants: Secondary | ICD-10-CM | POA: Insufficient documentation

## 2020-05-27 DIAGNOSIS — Z79899 Other long term (current) drug therapy: Secondary | ICD-10-CM | POA: Insufficient documentation

## 2020-05-27 DIAGNOSIS — I4819 Other persistent atrial fibrillation: Secondary | ICD-10-CM | POA: Diagnosis not present

## 2020-05-27 DIAGNOSIS — I10 Essential (primary) hypertension: Secondary | ICD-10-CM | POA: Diagnosis not present

## 2020-05-27 DIAGNOSIS — Z882 Allergy status to sulfonamides status: Secondary | ICD-10-CM | POA: Diagnosis not present

## 2020-05-27 DIAGNOSIS — Z9989 Dependence on other enabling machines and devices: Secondary | ICD-10-CM | POA: Diagnosis not present

## 2020-05-27 DIAGNOSIS — I480 Paroxysmal atrial fibrillation: Secondary | ICD-10-CM | POA: Diagnosis not present

## 2020-05-27 HISTORY — PX: CARDIOVERSION: SHX1299

## 2020-05-27 SURGERY — CARDIOVERSION
Anesthesia: General

## 2020-05-27 MED ORDER — LIDOCAINE 2% (20 MG/ML) 5 ML SYRINGE
INTRAMUSCULAR | Status: DC | PRN
  Administered 2020-05-27: 40 mg via INTRAVENOUS

## 2020-05-27 MED ORDER — PROPOFOL 10 MG/ML IV BOLUS
INTRAVENOUS | Status: DC | PRN
  Administered 2020-05-27: 80 mg via INTRAVENOUS

## 2020-05-27 MED ORDER — SODIUM CHLORIDE 0.9 % IV SOLN: INTRAVENOUS | Status: DC

## 2020-05-27 NOTE — Procedures (Signed)
Procedure: Electrical Cardioversion Indications:  Atrial Fibrillation  Procedure Details:  Consent: Risks of procedure as well as the alternatives and risks of each were explained to the (patient/caregiver).  Consent for procedure obtained.  Time Out: Verified patient identification, verified procedure, site/side was marked, verified correct patient position, special equipment/implants available, medications/allergies/relevent history reviewed, required imaging and test results available. PERFORMED.  Patient placed on cardiac monitor, pulse oximetry, supplemental oxygen as necessary.  Sedation given: lidocaine 40mg ; propofol 80mg  administered by anesthesia Pacer pads placed anterior and posterior chest.  Cardioverted 2 time(s).  Cardioversion with synchronized biphasic 200J shock.  Evaluation: Findings: Post procedure EKG shows: Sinus bradycardia Complications: None Patient did tolerate procedure well.  Time Spent Directly with the Patient:  61minutes   Freada Bergeron 05/27/2020, 10:13 AM

## 2020-05-27 NOTE — Discharge Instructions (Signed)
Electrical Cardioversion Electrical cardioversion is the delivery of a jolt of electricity to restore a normal rhythm to the heart. A rhythm that is too fast or is not regular keeps the heart from pumping well. In this procedure, sticky patches or metal paddles are placed on the chest to deliver electricity to the heart from a device. This procedure may be done in an emergency if:  There is low or no blood pressure as a result of the heart rhythm.  Normal rhythm must be restored as fast as possible to protect the brain and heart from further damage.  It may save a life. This may also be a scheduled procedure for irregular or fast heart rhythms that are not immediately life-threatening. Follow these instructions at home:  Do not drive for 24 hours if you were given a sedative during your procedure.  Take over-the-counter and prescription medicines only as told by your health care provider.  Ask your health care provider how to check your pulse. Check it often.  Rest for 48 hours after the procedure or as told by your health care provider.  Avoid or limit your caffeine use as told by your health care provider.  Keep all follow-up visits as told by your health care provider. This is important. Contact a health care provider if:  You feel like your heart is beating too quickly or your pulse is not regular.  You have a serious muscle cramp that does not go away. Get help right away if:  You have discomfort in your chest.  You are dizzy or you feel faint.  You have trouble breathing or you are short of breath.  Your speech is slurred.  You have trouble moving an arm or leg on one side of your body.  Your fingers or toes turn cold or blue. Summary  Electrical cardioversion is the delivery of a jolt of electricity to restore a normal rhythm to the heart.  This procedure may be done right away in an emergency or may be a scheduled procedure if the condition is not an  emergency.  Generally, this is a safe procedure.  After the procedure, check your pulse often as told by your health care provider.   

## 2020-05-27 NOTE — Anesthesia Preprocedure Evaluation (Addendum)
Anesthesia Evaluation  Patient identified by MRN, date of birth, ID band Patient awake    Reviewed: Allergy & Precautions, NPO status , Patient's Chart, lab work & pertinent test results  Airway Mallampati: III  TM Distance: >3 FB Neck ROM: Full    Dental  (+) Teeth Intact   Pulmonary    breath sounds clear to auscultation       Cardiovascular hypertension,  Rhythm:Irregular Rate:Normal     Neuro/Psych    GI/Hepatic   Endo/Other    Renal/GU      Musculoskeletal   Abdominal   Peds  Hematology   Anesthesia Other Findings   Reproductive/Obstetrics                             Anesthesia Physical Anesthesia Plan  ASA: III  Anesthesia Plan: General   Post-op Pain Management:    Induction: Intravenous  PONV Risk Score and Plan:   Airway Management Planned: Mask  Additional Equipment:   Intra-op Plan:   Post-operative Plan:   Informed Consent: I have reviewed the patients History and Physical, chart, labs and discussed the procedure including the risks, benefits and alternatives for the proposed anesthesia with the patient or authorized representative who has indicated his/her understanding and acceptance.       Plan Discussed with: CRNA and Anesthesiologist  Anesthesia Plan Comments:         Anesthesia Quick Evaluation

## 2020-05-27 NOTE — Anesthesia Procedure Notes (Signed)
Procedure Name: General with mask airway Performed by: Valda Favia, CRNA Pre-anesthesia Checklist: Patient identified, Emergency Drugs available, Suction available, Patient being monitored and Timeout performed Patient Re-evaluated:Patient Re-evaluated prior to induction Oxygen Delivery Method: Ambu bag Preoxygenation: Pre-oxygenation with 100% oxygen Induction Type: IV induction Dental Injury: Teeth and Oropharynx as per pre-operative assessment

## 2020-05-27 NOTE — Interval H&P Note (Signed)
History and Physical Interval Note:  05/27/2020 10:02 AM  [name]  has presented today for surgery, with the diagnosis of afib.  The various methods of treatment have been discussed with the patient and family. After consideration of risks, benefits and other options for treatment, the patient has consented to  Procedure(s): CARDIOVERSION (N/A) as a surgical intervention.  The patient's history has been reviewed, patient examined, no change in status, stable for surgery.  I have reviewed the patient's chart and labs.  Questions were answered to the patient's satisfaction.     Deborah [name]

## 2020-05-27 NOTE — Transfer of Care (Signed)
Immediate Anesthesia Transfer of Care Note  Patient: Deborah [name]  Procedure(s) Performed: CARDIOVERSION (N/A )  Patient Location: Endoscopy Unit  Anesthesia Type:General  Level of Consciousness: awake, alert  and oriented  Airway & Oxygen Therapy: Patient Spontanous Breathing  Post-op Assessment: Report given to RN and Post -op Vital signs reviewed and stable  Post vital signs: Reviewed and stable  Last Vitals:  Vitals Value Taken Time  BP    Temp    Pulse    Resp    SpO2      Last Pain:  Vitals:   05/27/20 0856  TempSrc: Oral  PainSc: 0-No pain         Complications: No complications documented.

## 2020-05-27 NOTE — Anesthesia Postprocedure Evaluation (Signed)
Anesthesia Post Note  Patient: Deborah [name]  Procedure(s) Performed: CARDIOVERSION (N/A )     Patient location during evaluation: Endoscopy Anesthesia Type: General Level of consciousness: awake and alert Pain management: pain level controlled Vital Signs Assessment: post-procedure vital signs reviewed and stable Respiratory status: spontaneous breathing, nonlabored ventilation, respiratory function stable and patient connected to nasal cannula oxygen Cardiovascular status: blood pressure returned to baseline and stable Postop Assessment: no apparent nausea or vomiting Anesthetic complications: no   No complications documented.  Last Vitals:  Vitals:   05/27/20 1030 05/27/20 1040  BP: 121/83 126/87  Pulse: (!) 54 (!) 58  Resp: 17 20  Temp:    SpO2: 94% 98%    Last Pain:  Vitals:   05/27/20 1040  TempSrc:   PainSc: 0-No pain                 Deborah [name]

## 2020-06-03 ENCOUNTER — Encounter (HOSPITAL_COMMUNITY): Payer: Self-pay | Admitting: Nurse Practitioner

## 2020-06-03 ENCOUNTER — Other Ambulatory Visit: Payer: Self-pay

## 2020-06-03 ENCOUNTER — Ambulatory Visit (HOSPITAL_COMMUNITY)
Admission: RE | Admit: 2020-06-03 | Discharge: 2020-06-03 | Disposition: A | Discharge status: Home / Self Care | Payer: PPO | Source: Ambulatory Visit | Attending: Nurse Practitioner | Admitting: Nurse Practitioner

## 2020-06-03 VITALS — BP 140/88 | HR 62 | Ht 73.0 in | Wt 293.4 lb

## 2020-06-03 DIAGNOSIS — I4510 Unspecified right bundle-branch block: Secondary | ICD-10-CM | POA: Insufficient documentation

## 2020-06-03 DIAGNOSIS — I10 Essential (primary) hypertension: Secondary | ICD-10-CM | POA: Insufficient documentation

## 2020-06-03 DIAGNOSIS — Z7901 Long term (current) use of anticoagulants: Secondary | ICD-10-CM | POA: Insufficient documentation

## 2020-06-03 DIAGNOSIS — I4819 Other persistent atrial fibrillation: Secondary | ICD-10-CM

## 2020-06-03 DIAGNOSIS — D6869 Other thrombophilia: Secondary | ICD-10-CM | POA: Diagnosis not present

## 2020-06-03 NOTE — Progress Notes (Signed)
Laurena Bering   Primary Care Physician: Gaynelle Arabian, MD Referring Physician: Dr. Gloris Ham Deborah [name] is a 64 y.o. female with a h/o persistent afib that is in the afib clinic for f/u on    ablation one month ago.  She has had 2 ablations in the past and several cardioversion's in the past. She remains on  Propafenone  325 mg bid.  She reports that she has not noted any afib since the procedure. She feels well. She is surprised to find out she is in afib at 84 bpm.  No swallowing or groin issues since the procedure. She  did have a fall yesterday, fell backward from a squatting position,  and is questioning if that could have thrown her out of rhythm.   F/u in afib clinic, 11/17. Ekg shows that pt continues with afib . She has noted taking her BP/HR issues since last visit,  that the pulse indicator light has been showing an irregular pulse so I feel that she is in persistent afib. I discussed a cardioversion and she is in agreement. No missed anticoagulation.   F/u successful cardioversion, 06/03/20. She continues in SR, she feels improved. No concerns voiced today.  Today, she denies symptoms of palpitations, chest pain, shortness of breath, orthopnea, PND, lower extremity edema, dizziness, presyncope, syncope, or neurologic sequela. The patient is tolerating medications without difficulties and is otherwise without complaint today.   Past Medical History:  Diagnosis Date  . A-fib ([place])   . Anemia    hx   . Anxiety   . Arthritis    "was in right knee" (01/25/2018)  . Dysrhythmia    atrial fib/flutter  . GERD (gastroesophageal reflux disease)    occ  . HTN (hypertension)   . Migraine    "used to have them more frequently before menopause; now only have a couple/yr" (01/25/2018)  . Obesity   . OSA on CPAP   . PAF (paroxysmal atrial fibrillation) ([place])    s/p DCCV 2010  . Peripheral vascular disease ([place]) 2016   after flying in leg   Past Surgical History:  Procedure Laterality Date   . ATRIAL FIBRILLATION ABLATION N/A 01/25/2018   Procedure: ATRIAL FIBRILLATION ABLATION;  Surgeon: Constance Haw, MD;  Location: [place] [facility];  Service: Cardiovascular;  Laterality: N/A;  . ATRIAL FIBRILLATION ABLATION N/A 04/11/2020   Procedure: ATRIAL FIBRILLATION ABLATION;  Surgeon: Constance Haw, MD;  Location: [place] [facility];  Service: Cardiovascular;  Laterality: N/A;  . CARDIAC CATHETERIZATION  2005  . CARDIOVERSION N/A 12/09/2017   Procedure: CARDIOVERSION;  Surgeon: Sanda Klein, MD;  Location: [place] ENDOSCOPY;  Service: Cardiovascular;  Laterality: N/A;  . CARDIOVERSION N/A 05/09/2018   Procedure: CARDIOVERSION;  Surgeon: Josue Hector, MD;  Location: [facility] ENDOSCOPY;  Service: Cardiovascular;  Laterality: N/A;  . CARDIOVERSION N/A 01/03/2019   Procedure: CARDIOVERSION;  Surgeon: Thayer Headings, MD;  Location: [facility] ENDOSCOPY;  Service: Cardiovascular;  Laterality: N/A;  . CARDIOVERSION N/A 05/27/2020   Procedure: CARDIOVERSION;  Surgeon: Freada Bergeron, MD;  Location: [place];  Service: Cardiovascular;  Laterality: N/A;  . CATARACT EXTRACTION W/ INTRAOCULAR LENS IMPLANT Left 2016  . ELECTROPHYSIOLOGIC STUDY N/A 12/04/2015   Procedure: Atrial Fibrillation Ablation;  Surgeon: Will Meredith Leeds, MD;  Location: [place] CV LAB;  Service: Cardiovascular;  Laterality: N/A;  . JOINT REPLACEMENT    . TONSILLECTOMY AND ADENOIDECTOMY    . TOTAL KNEE ARTHROPLASTY Right 04/23/2016   Procedure: TOTAL KNEE  ARTHROPLASTY;  Surgeon: [name], MD;  Location: [place];  Service: Orthopedics;  Laterality: Right;  . TUBAL LIGATION      Current Outpatient Medications  Medication Sig Dispense Refill  . acetaminophen (TYLENOL) 500 MG tablet Take 1,000 mg by mouth every 6 (six) hours as needed for moderate pain or headache.    . ALPRAZolam (XANAX) 0.5 MG tablet Take 0.25-1 mg by mouth 2 (two) times daily as needed for anxiety.     . calcium carbonate (TUMS - DOSED IN  MG ELEMENTAL CALCIUM) 500 MG chewable tablet Chew 3 tablets by mouth daily as needed for indigestion or heartburn.     . Cholecalciferol (VITAMIN D3) 125 MCG (5000 UT) TABS Take 5,000 Units by mouth daily.     Marland Kitchen ELIQUIS 5 MG TABS tablet TAKE 1 TABLET BY MOUTH TWICE A DAY (Patient taking differently: Take 5 mg by mouth 2 (two) times daily. ) 180 tablet 1  . escitalopram (LEXAPRO) 10 MG tablet Take 10 mg by mouth every evening.   3  . metoprolol succinate (TOPROL-XL) 50 MG 24 hr tablet TAKE 1 TABLET BY MOUTH DAILY. TAKE WITH OR IMMEDIATELY FOLLOWING A MEAL. (Patient taking differently: Take 50 mg by mouth daily. ) 90 tablet 2  . Polyethyl Glycol-Propyl Glycol (SYSTANE) 0.4-0.3 % SOLN Place 1 drop into both eyes 3 (three) times daily as needed (dry/irritated eyes.).    Marland Kitchen propafenone (RYTHMOL SR) 325 MG 12 hr capsule TAKE 1 CAPSULE (325 MG TOTAL) BY MOUTH 2 (TWO) TIMES DAILY. 180 capsule 2  . vitamin B-12 (CYANOCOBALAMIN) 1000 MCG tablet Take 1,000 mcg by mouth daily.     No current facility-administered medications for this encounter.    Allergies  Allergen Reactions  . Benazepril Cough  . Adhesive [Tape] Rash and Other (See Comments)    bandaides  . Sulfa Antibiotics Rash    Social History   Socioeconomic History  . Marital status: Married    Spouse name: Not on file  . Number of children: Not on file  . Years of education: Not on file  . Highest education level: Not on file  Occupational History  . Not on file  Tobacco Use  . Smoking status: Never Smoker  . Smokeless tobacco: Never Used  Vaping Use  . Vaping Use: Never used  Substance and Sexual Activity  . Alcohol use: Yes    Alcohol/week: 0.0 standard drinks    Comment: 01/25/2018 "couple drinks/month; if that"  . Drug use: Never  . Sexual activity: Yes  Other Topics Concern  . Not on file  Social History Narrative  . Not on file   Social Determinants of Health   Financial Resource Strain:   . Difficulty of Paying  Living Expenses: Not on file  Food Insecurity:   . Worried About Charity fundraiser in the Last Year: Not on file  . Ran Out of Food in the Last Year: Not on file  Transportation Needs:   . Lack of Transportation (Medical): Not on file  . Lack of Transportation (Non-Medical): Not on file  Physical Activity:   . Days of Exercise per Week: Not on file  . Minutes of Exercise per Session: Not on file  Stress:   . Feeling of Stress : Not on file  Social Connections:   . Frequency of Communication with Friends and Family: Not on file  . Frequency of Social Gatherings with Friends and Family: Not on file  . Attends Religious Services: Not on  file  . Active Member of Clubs or Organizations: Not on file  . Attends Archivist Meetings: Not on file  . Marital Status: Not on file  Intimate Partner Violence:   . Fear of Current or Ex-Partner: Not on file  . Emotionally Abused: Not on file  . Physically Abused: Not on file  . Sexually Abused: Not on file    Family History  Problem Relation Age of Onset  . Heart disease Mother   . Stomach cancer Paternal Grandfather   . Colon cancer Neg Hx     ROS- All systems are reviewed and negative except as per the HPI above  Physical Exam: Vitals:   06/03/20 1356  BP: 140/88  Pulse: 62  Weight: 133.1 kg  Height: 6\' 1"  (1.854 m)   Wt Readings from Last 3 Encounters:  06/03/20 133.1 kg  05/27/20 124.7 kg  05/14/20 130.7 kg    Labs: Lab Results  Component Value Date   NA 139 05/14/2020   K 4.4 05/14/2020   CL 106 05/14/2020   CO2 24 05/14/2020   GLUCOSE 125 (H) 05/14/2020   BUN 12 05/14/2020   CREATININE 1.12 (H) 05/14/2020   CALCIUM 9.2 05/14/2020   Lab Results  Component Value Date   INR WILL FOLLOW 09/23/2016   INR 1.0 09/23/2016   No results found for: CHOL, HDL, LDLCALC, TRIG   GEN- The patient is well appearing, alert and oriented x 3 today.   Head- normocephalic, atraumatic Eyes-  Sclera clear, conjunctiva  pink Ears- hearing intact Oropharynx- clear Neck- supple, no JVP Lymph- no cervical lymphadenopathy Lungs- Clear to ausculation bilaterally, normal work of breathing Heart- regular rate and rhythm, no murmurs, rubs or gallops, PMI not laterally displaced GI- soft, NT, ND, + BS Extremities- no clubbing, cyanosis, or edema MS- no significant deformity or atrophy Skin- no rash or lesion Psych- euthymic mood, full affect Neuro- strength and sensation are intact   EKG- NSR at 62 bpm RBBB Epic records reviewed  Assessment and Plan: 1. Persistent afib S/p  3rd ablation one month ago , unaware that she was out of rhythm  Now s/p successful cardioversion and continues in SR  Continue metoprolol succinate  50 mg daily  Continue propafenone 325 mg bid  2. Chadsvasc score of 2 Continue eliquis 5 mg bid, asked not to  interrupt blood thinner  F/u with Dr. Curt Bears  1/25  Geroge Baseman. Avamarie Crossley, [facility] [address] [place], [place] [address] [phone]

## 2020-07-13 ENCOUNTER — Other Ambulatory Visit: Payer: Self-pay | Admitting: Cardiology

## 2020-07-22 ENCOUNTER — Ambulatory Visit: Payer: PPO | Admitting: Cardiology

## 2020-07-22 ENCOUNTER — Encounter: Payer: Self-pay | Admitting: Cardiology

## 2020-07-22 ENCOUNTER — Other Ambulatory Visit: Payer: Self-pay

## 2020-07-22 VITALS — BP 124/82 | HR 68 | Ht 73.0 in | Wt 293.0 lb

## 2020-07-22 DIAGNOSIS — I4819 Other persistent atrial fibrillation: Secondary | ICD-10-CM

## 2020-07-22 NOTE — Progress Notes (Signed)
Electrophysiology Office Note   Date:  07/22/2020   ID:  Deborah [name], DOB April 27, 1956, MRN [id number]  PCP:  Gaynelle Arabian, MD  Cardiologist:  Irish Lack Primary Electrophysiologist:  Constance Haw, MD    No chief complaint on file.    History of Present Illness: Deborah [name] is a 65 y.o. female who presents today for electrophysiology evaluation.     She has a history of paroxysmal atrial fibrillation.  She is status post ablation 12/04/2015.  She did well for a while, but did return to atrial fibrillation.  She had a repeat ablation July 2019.  She unfortunately came back to clinic in atrial fibrillation and is now status post ablation x3 on 04/11/2020.  Today, denies symptoms of palpitations, chest pain, shortness of breath, orthopnea, PND, lower extremity edema, claudication, dizziness, presyncope, syncope, bleeding, or neurologic sequela. The patient is tolerating medications without difficulties.  She is currently feeling well.  She has had no further episodes of atrial fibrillation since her ablation.  She is able to do all of her daily activities without restriction.  Past Medical History:  Diagnosis Date  . A-fib ([place])   . Anemia    hx   . Anxiety   . Arthritis    "was in right knee" (01/25/2018)  . Dysrhythmia    atrial fib/flutter  . GERD (gastroesophageal reflux disease)    occ  . HTN (hypertension)   . Migraine    "used to have them more frequently before menopause; now only have a couple/yr" (01/25/2018)  . Obesity   . OSA on CPAP   . PAF (paroxysmal atrial fibrillation) ([place])    s/p DCCV 2010  . Peripheral vascular disease ([place]) 2016   after flying in leg   Past Surgical History:  Procedure Laterality Date  . ATRIAL FIBRILLATION ABLATION N/A 01/25/2018   Procedure: ATRIAL FIBRILLATION ABLATION;  Surgeon: Constance Haw, MD;  Location: [place] CV LAB;  Service: Cardiovascular;  Laterality: N/A;  . ATRIAL FIBRILLATION ABLATION N/A 04/11/2020    Procedure: ATRIAL FIBRILLATION ABLATION;  Surgeon: Constance Haw, MD;  Location: [place] CV LAB;  Service: Cardiovascular;  Laterality: N/A;  . CARDIAC CATHETERIZATION  2005  . CARDIOVERSION N/A 12/09/2017   Procedure: CARDIOVERSION;  Surgeon: Sanda Klein, MD;  Location: [place] ENDOSCOPY;  Service: Cardiovascular;  Laterality: N/A;  . CARDIOVERSION N/A 05/09/2018   Procedure: CARDIOVERSION;  Surgeon: Josue Hector, MD;  Location: [facility] ENDOSCOPY;  Service: Cardiovascular;  Laterality: N/A;  . CARDIOVERSION N/A 01/03/2019   Procedure: CARDIOVERSION;  Surgeon: Thayer Headings, MD;  Location: [facility] ENDOSCOPY;  Service: Cardiovascular;  Laterality: N/A;  . CARDIOVERSION N/A 05/27/2020   Procedure: CARDIOVERSION;  Surgeon: Freada Bergeron, MD;  Location: [place];  Service: Cardiovascular;  Laterality: N/A;  . CATARACT EXTRACTION W/ INTRAOCULAR LENS IMPLANT Left 2016  . ELECTROPHYSIOLOGIC STUDY N/A 12/04/2015   Procedure: Atrial Fibrillation Ablation;  Surgeon: Chelby Salata Meredith Leeds, MD;  Location: [place] CV LAB;  Service: Cardiovascular;  Laterality: N/A;  . JOINT REPLACEMENT    . TONSILLECTOMY AND ADENOIDECTOMY    . TOTAL KNEE ARTHROPLASTY Right 04/23/2016   Procedure: TOTAL KNEE ARTHROPLASTY;  Surgeon: [name], MD;  Location: [place];  Service: Orthopedics;  Laterality: Right;  . TUBAL LIGATION       Current Outpatient Medications  Medication Sig Dispense Refill  . acetaminophen (TYLENOL) 500 MG tablet Take 1,000 mg by mouth every 6 (six) hours as needed for moderate pain or  headache.    . ALPRAZolam (XANAX) 0.5 MG tablet Take 0.25-1 mg by mouth 2 (two) times daily as needed for anxiety.     . calcium carbonate (TUMS - DOSED IN MG ELEMENTAL CALCIUM) 500 MG chewable tablet Chew 3 tablets by mouth daily as needed for indigestion or heartburn.     . Cholecalciferol (VITAMIN D3) 125 MCG (5000 UT) TABS Take 5,000 Units by mouth daily.     Marland Kitchen ELIQUIS 5 MG TABS tablet TAKE 1  TABLET BY MOUTH TWICE A DAY 180 tablet 1  . escitalopram (LEXAPRO) 10 MG tablet Take 10 mg by mouth every evening.   3  . metoprolol succinate (TOPROL-XL) 50 MG 24 hr tablet TAKE 1 TABLET BY MOUTH DAILY. TAKE WITH OR IMMEDIATELY FOLLOWING A MEAL. 90 tablet 2  . Polyethyl Glycol-Propyl Glycol (SYSTANE) 0.4-0.3 % SOLN Place 1 drop into both eyes 3 (three) times daily as needed (dry/irritated eyes.).    Marland Kitchen vitamin B-12 (CYANOCOBALAMIN) 1000 MCG tablet Take 1,000 mcg by mouth daily.     No current facility-administered medications for this visit.    Allergies:   Benazepril, Adhesive [tape], and Sulfa antibiotics   Social History:  The patient  reports that she has never smoked. She has never used smokeless tobacco. She reports current alcohol use. She reports that she does not use drugs.   Family History:  The patient's family history includes Heart disease in her mother; Stomach cancer in her paternal grandfather.   ROS:  Please see the history of present illness.   Otherwise, review of systems is positive for none.   All other systems are reviewed and negative.   PHYSICAL EXAM: VS:  BP 124/82   Pulse 68   Ht 6\' 1"  (1.854 m)   Wt 293 lb (132.9 kg)   SpO2 95%   BMI 38.66 kg/m  , BMI Body mass index is 38.66 kg/m. GEN: Well nourished, well developed, in no acute distress  HEENT: normal  Neck: no JVD, carotid bruits, or masses Cardiac: RRR; no murmurs, rubs, or gallops,no edema  Respiratory:  clear to auscultation bilaterally, normal work of breathing GI: soft, nontender, nondistended, + BS MS: no deformity or atrophy  Skin: warm and dry Neuro:  Strength and sensation are intact Psych: euthymic mood, full affect  EKG:  EKG is ordered today. Personal review of the ekg ordered shows sinus rhythm, right bundle branch block  Recent Labs: 05/14/2020: BUN 12; Creatinine, Ser 1.12; Hemoglobin 13.4; Platelets 236; Potassium 4.4; Sodium 139    Lipid Panel  No results found for: CHOL,  TRIG, HDL, CHOLHDL, VLDL, LDLCALC, LDLDIRECT   Wt Readings from Last 3 Encounters:  07/22/20 293 lb (132.9 kg)  06/03/20 293 lb 6.4 oz (133.1 kg)  05/27/20 275 lb (124.7 kg)      Other studies Reviewed: Additional studies/ records that were reviewed today include: TTE 11/25/15 - Left ventricle: Systolic function was normal. The estimated   ejection fraction was in the range of 50% to 55%. The study is   not technically sufficient to allow evaluation of LV diastolic   function. - Left atrium: The appendage was moderately dilated. - Atrial septum: No defect or patent foramen ovale was identified.   ASSESSMENT AND PLAN:  1.  Persistent atrial fibrillation: Status post ablation 02/01/2016, 01/25/2018, and 04/11/2020.  Currently on propafenone, high risk medication monitoring.  CHA2DS2-VASc of 2.  She is fortunately remained in sinus rhythm.  Due to that we Deborah [name] stop her propafenone today.  2.  Hypertension: Currently well controlled  3.  Obstructive sleep apnea: CPAP compliance encouraged  Current medicines are reviewed at length with the patient today.   The patient does not have concerns regarding her medicines.  The following changes were made today: Stop propafenone  Labs/ tests ordered today include:  Orders Placed This Encounter  Procedures  . EKG 12-Lead     Disposition:   FU with Deborah [name] 3 months  Signed, Gerline Ratto Meredith Leeds, MD  07/22/2020 10:00 AM     [facility] [address] [place] [address] [phone] (office) [phone] (fax)

## 2020-07-22 NOTE — Patient Instructions (Signed)
Medication Instructions:  Your physician has recommended you make the following change in your medication:  1. STOP Propafenone  *If you need a refill on your cardiac medications before your next appointment, please call your pharmacy*   Lab Work: None ordered   Testing/Procedures: None ordered   Follow-Up: At [facility], you and your health needs are our priority.  As part of our continuing mission to provide you with exceptional heart care, we have created designated Provider Care Teams.  These Care Teams include your primary Cardiologist (physician) and Advanced Practice Providers (APPs -  Physician Assistants and Nurse Practitioners) who all work together to provide you with the care you need, when you need it.   Your next appointment:   3 month(s)  The format for your next appointment:   In Person  Provider:   Allegra Lai, MD    Thank you for choosing [facility]!!   Trinidad Curet, RN [phone]

## 2020-08-20 DIAGNOSIS — G4733 Obstructive sleep apnea (adult) (pediatric): Secondary | ICD-10-CM | POA: Diagnosis not present

## 2020-08-29 NOTE — Telephone Encounter (Signed)
Called pt to further discuss. Explained rationale for restarting antiarrythmic.  Scheduled pt for f/u next Tuesday in AFib clinic for EKG/evaluation/discussion.  Pt agreeable to plan.

## 2020-09-01 NOTE — Progress Notes (Signed)
Laurena Bering   Primary Care Physician: Gaynelle Arabian, MD Referring Physician: Dr. Gloris Ham Deborah [name] is a 65 y.o. female with a h/o persistent afib that is in the afib clinic for f/u on    ablation one month ago.  She has had 2 ablations in the past and several cardioversion's in the past. She remains on  Propafenone  325 mg bid.  She reports that she has not noted any afib since the procedure. She feels well. She is surprised to find out she is in afib at 84 bpm.  No swallowing or groin issues since the procedure. She  did have a fall yesterday, fell backward from a squatting position,  and is questioning if that could have thrown her out of rhythm.   F/u in afib clinic, 11/17. Ekg shows that pt continues with afib . She has noted taking her BP/HR issues since last visit,  that the pulse indicator light has been showing an irregular pulse so I feel that she is in persistent afib. I discussed a cardioversion and she is in agreement. No missed anticoagulation.   F/u successful cardioversion, 06/03/20. She continues in SR, she feels improved. No concerns voiced today.  Follow up in the AF clinic 09/01/20. Patient continued to have atrial fibrillation post repeat ablation and propafenone was resumed. Patient converted to SR with the medication. She is feeling well today. She denies any bleeding issues on anticoagulation.   Today, she denies symptoms of palpitations, chest pain, shortness of breath, orthopnea, PND, lower extremity edema, dizziness, presyncope, syncope, or neurologic sequela. The patient is tolerating medications without difficulties and is otherwise without complaint today.   Past Medical History:  Diagnosis Date  . A-fib ([place])   . Anemia    hx   . Anxiety   . Arthritis    "was in right knee" (01/25/2018)  . Dysrhythmia    atrial fib/flutter  . GERD (gastroesophageal reflux disease)    occ  . HTN (hypertension)   . Migraine    "used to have them more frequently before menopause;  now only have a couple/yr" (01/25/2018)  . Obesity   . OSA on CPAP   . PAF (paroxysmal atrial fibrillation) ([place])    s/p DCCV 2010  . Peripheral vascular disease ([place]) 2016   after flying in leg   Past Surgical History:  Procedure Laterality Date  . ATRIAL FIBRILLATION ABLATION N/A 01/25/2018   Procedure: ATRIAL FIBRILLATION ABLATION;  Surgeon: Constance Haw, MD;  Location: [place] [facility];  Service: Cardiovascular;  Laterality: N/A;  . ATRIAL FIBRILLATION ABLATION N/A 04/11/2020   Procedure: ATRIAL FIBRILLATION ABLATION;  Surgeon: Constance Haw, MD;  Location: [place] [facility];  Service: Cardiovascular;  Laterality: N/A;  . CARDIAC CATHETERIZATION  2005  . CARDIOVERSION N/A 12/09/2017   Procedure: CARDIOVERSION;  Surgeon: Sanda Klein, MD;  Location: [place] ENDOS[facility];  Service: Cardiovascular;  Laterality: N/A;  . CARDIOVERSION N/A 05/09/2018   Procedure: CARDIOVERSION;  Surgeon: Josue Hector, MD;  Location: [facility] ENDOSCOPY;  Service: Cardiovascular;  Laterality: N/A;  . CARDIOVERSION N/A 01/03/2019   Procedure: CARDIOVERSION;  Surgeon: Thayer Headings, MD;  Location: [facility] ENDOSCOPY;  Service: Cardiovascular;  Laterality: N/A;  . CARDIOVERSION N/A 05/27/2020   Procedure: CARDIOVERSION;  Surgeon: Freada Bergeron, MD;  Location: [place];  Service: Cardiovascular;  Laterality: N/A;  . CATARACT EXTRACTION W/ INTRAOCULAR LENS IMPLANT Left 2016  . ELECTROPHYSIOLOGIC STUDY N/A 12/04/2015   Procedure: Atrial Fibrillation Ablation;  Surgeon:  Will Meredith Leeds, MD;  Location: [place] CV LAB;  Service: Cardiovascular;  Laterality: N/A;  . JOINT REPLACEMENT    . TONSILLECTOMY AND ADENOIDECTOMY    . TOTAL KNEE ARTHROPLASTY Right 04/23/2016   Procedure: TOTAL KNEE ARTHROPLASTY;  Surgeon: [name], MD;  Location: [place];  Service: Orthopedics;  Laterality: Right;  . TUBAL LIGATION      Current Outpatient Medications  Medication Sig Dispense Refill  . acetaminophen  (TYLENOL) 500 MG tablet Take 1,000 mg by mouth every 6 (six) hours as needed for moderate pain or headache.    . ALPRAZolam (XANAX) 0.5 MG tablet Take 0.25-1 mg by mouth 2 (two) times daily as needed for anxiety.     . calcium carbonate (TUMS - DOSED IN MG ELEMENTAL CALCIUM) 500 MG chewable tablet Chew 3 tablets by mouth daily as needed for indigestion or heartburn.     . Cholecalciferol (VITAMIN D3) 125 MCG (5000 UT) TABS Take 5,000 Units by mouth daily.     Marland Kitchen ELIQUIS 5 MG TABS tablet TAKE 1 TABLET BY MOUTH TWICE A DAY 180 tablet 1  . escitalopram (LEXAPRO) 10 MG tablet Take 10 mg by mouth every evening.   3  . metoprolol succinate (TOPROL-XL) 50 MG 24 hr tablet TAKE 1 TABLET BY MOUTH DAILY. TAKE WITH OR IMMEDIATELY FOLLOWING A MEAL. 90 tablet 2  . Polyethyl Glycol-Propyl Glycol (SYSTANE) 0.4-0.3 % SOLN Place 1 drop into both eyes 3 (three) times daily as needed (dry/irritated eyes.).    Marland Kitchen vitamin B-12 (CYANOCOBALAMIN) 1000 MCG tablet Take 1,000 mcg by mouth daily.     No current facility-administered medications for this visit.    Allergies  Allergen Reactions  . Benazepril Cough  . Adhesive [Tape] Rash and Other (See Comments)    bandaides  . Sulfa Antibiotics Rash    Social History   Socioeconomic History  . Marital status: Married    Spouse name: Not on file  . Number of children: Not on file  . Years of education: Not on file  . Highest education level: Not on file  Occupational History  . Not on file  Tobacco Use  . Smoking status: Never Smoker  . Smokeless tobacco: Never Used  Vaping Use  . Vaping Use: Never used  Substance and Sexual Activity  . Alcohol use: Yes    Alcohol/week: 0.0 standard drinks    Comment: 01/25/2018 "couple drinks/month; if that"  . Drug use: Never  . Sexual activity: Yes  Other Topics Concern  . Not on file  Social History Narrative  . Not on file   Social Determinants of Health   Financial Resource Strain: Not on file  Food Insecurity:  Not on file  Transportation Needs: Not on file  Physical Activity: Not on file  Stress: Not on file  Social Connections: Not on file  Intimate Partner Violence: Not on file    Family History  Problem Relation Age of Onset  . Heart disease Mother   . Stomach cancer Paternal Grandfather   . Colon cancer Neg Hx     ROS- All systems are reviewed and negative except as per the HPI above  Physical Exam: There were no vitals filed for this visit. Wt Readings from Last 3 Encounters:  07/22/20 132.9 kg  06/03/20 133.1 kg  05/27/20 124.7 kg    Labs: Lab Results  Component Value Date   NA 139 05/14/2020   K 4.4 05/14/2020   CL 106 05/14/2020   CO2 24  05/14/2020   GLUCOSE 125 (H) 05/14/2020   BUN 12 05/14/2020   CREATININE 1.12 (H) 05/14/2020   CALCIUM 9.2 05/14/2020   Lab Results  Component Value Date   INR WILL FOLLOW 09/23/2016   INR 1.0 09/23/2016   No results found for: CHOL, HDL, LDLCALC, TRIG   GEN- The patient is a well appearing obese female, alert and oriented x 3 today.   HEENT-head normocephalic, atraumatic, sclera clear, conjunctiva pink, hearing intact, trachea midline. Lungs- Clear to ausculation bilaterally, normal work of breathing Heart- Regular rate and rhythm, no murmurs, rubs or gallops  GI- soft, NT, ND, + BS Extremities- no clubbing, cyanosis, or edema MS- no significant deformity or atrophy Skin- no rash or lesion Psych- euthymic mood, full affect Neuro- strength and sensation are intact   EKG- SR, RBBB, HR 62, PR 178, QRS 176, QTc 499  Epic records reviewed  Assessment and Plan: 1. Persistent afib S/p repeat ablation 04/11/20 Patient converted to [place] with medication.  Continue metoprolol succinate 50 mg daily  Continue propafenone 325 mg BID  2. Chadsvasc score of 2 Continue eliquis 5 mg BID  3. OSA The importance of adequate treatment of sleep apnea was discussed today in order to improve our ability to maintain sinus rhythm long  term. Patient reports compliance with CPAP therapy.    Follow up with Dr Curt Bears as scheduled.    [facility] [address] [place], [place] [address] [phone]

## 2020-09-02 ENCOUNTER — Ambulatory Visit (HOSPITAL_COMMUNITY)
Admission: RE | Admit: 2020-09-02 | Discharge: 2020-09-02 | Disposition: A | Discharge status: Home / Self Care | Payer: PPO | Source: Ambulatory Visit | Attending: Physician Assistant | Admitting: Physician Assistant

## 2020-09-02 ENCOUNTER — Other Ambulatory Visit (HOSPITAL_COMMUNITY): Payer: Self-pay | Admitting: Emergency Medicine

## 2020-09-02 ENCOUNTER — Encounter (HOSPITAL_COMMUNITY): Payer: Self-pay | Admitting: Physician Assistant

## 2020-09-02 ENCOUNTER — Other Ambulatory Visit: Payer: Self-pay

## 2020-09-02 VITALS — BP 122/78 | HR 62 | Ht 73.0 in | Wt 294.0 lb

## 2020-09-02 DIAGNOSIS — I4819 Other persistent atrial fibrillation: Secondary | ICD-10-CM | POA: Diagnosis not present

## 2020-09-02 DIAGNOSIS — G4733 Obstructive sleep apnea (adult) (pediatric): Secondary | ICD-10-CM | POA: Insufficient documentation

## 2020-09-02 DIAGNOSIS — Z79899 Other long term (current) drug therapy: Secondary | ICD-10-CM | POA: Insufficient documentation

## 2020-09-02 DIAGNOSIS — Z7901 Long term (current) use of anticoagulants: Secondary | ICD-10-CM | POA: Diagnosis not present

## 2020-09-02 MED ORDER — PROPAFENONE HCL ER 325 MG PO CP12
325.0000 mg | ORAL_CAPSULE | Freq: Two times a day (BID) | ORAL | 6 refills | Status: DC
Start: 2020-09-02 — End: 2021-02-27

## 2020-09-02 MED ORDER — PROPAFENONE HCL ER 325 MG PO CP12
325.0000 mg | ORAL_CAPSULE | Freq: Two times a day (BID) | ORAL | Status: DC
Start: 2020-09-02 — End: 2020-09-02

## 2020-09-22 ENCOUNTER — Other Ambulatory Visit: Payer: Self-pay | Admitting: Cardiology

## 2020-09-22 NOTE — Telephone Encounter (Signed)
Prescription refill request for Eliquis received.  Indication: Afib  Last office visit: [place], 07/22/2020 Scr: 1.12, 05/14/2020 Age:  65 yo  Weight: 133.4 kg   Pt is on the correct dose of Eliquis per dosing criteria, prescription refill sent for Eliquis 5mg  BID.

## 2020-10-17 DIAGNOSIS — H35371 Puckering of macula, right eye: Secondary | ICD-10-CM | POA: Diagnosis not present

## 2020-10-17 DIAGNOSIS — H25811 Combined forms of age-related cataract, right eye: Secondary | ICD-10-CM | POA: Diagnosis not present

## 2020-10-17 DIAGNOSIS — H5211 Myopia, right eye: Secondary | ICD-10-CM | POA: Diagnosis not present

## 2020-10-17 DIAGNOSIS — H524 Presbyopia: Secondary | ICD-10-CM | POA: Diagnosis not present

## 2020-10-17 DIAGNOSIS — H31012 Macula scars of posterior pole (postinflammatory) (post-traumatic), left eye: Secondary | ICD-10-CM | POA: Diagnosis not present

## 2020-10-17 DIAGNOSIS — H35352 Cystoid macular degeneration, left eye: Secondary | ICD-10-CM | POA: Diagnosis not present

## 2020-10-17 DIAGNOSIS — H353111 Nonexudative age-related macular degeneration, right eye, early dry stage: Secondary | ICD-10-CM | POA: Diagnosis not present

## 2020-10-17 DIAGNOSIS — H04123 Dry eye syndrome of bilateral lacrimal glands: Secondary | ICD-10-CM | POA: Diagnosis not present

## 2020-10-28 ENCOUNTER — Ambulatory Visit: Payer: PPO | Admitting: Cardiology

## 2020-10-28 DIAGNOSIS — H35052 Retinal neovascularization, unspecified, left eye: Secondary | ICD-10-CM | POA: Diagnosis not present

## 2020-10-28 DIAGNOSIS — H3581 Retinal edema: Secondary | ICD-10-CM | POA: Diagnosis not present

## 2020-10-28 DIAGNOSIS — H353112 Nonexudative age-related macular degeneration, right eye, intermediate dry stage: Secondary | ICD-10-CM | POA: Diagnosis not present

## 2020-10-28 DIAGNOSIS — Z961 Presence of intraocular lens: Secondary | ICD-10-CM | POA: Diagnosis not present

## 2020-10-28 DIAGNOSIS — H2511 Age-related nuclear cataract, right eye: Secondary | ICD-10-CM | POA: Diagnosis not present

## 2020-10-28 DIAGNOSIS — H318 Other specified disorders of choroid: Secondary | ICD-10-CM | POA: Diagnosis not present

## 2020-10-28 DIAGNOSIS — H26492 Other secondary cataract, left eye: Secondary | ICD-10-CM | POA: Diagnosis not present

## 2020-11-28 DIAGNOSIS — G4733 Obstructive sleep apnea (adult) (pediatric): Secondary | ICD-10-CM | POA: Diagnosis not present

## 2021-01-15 ENCOUNTER — Ambulatory Visit: Payer: PPO | Admitting: Cardiology

## 2021-01-21 DIAGNOSIS — Z1231 Encounter for screening mammogram for malignant neoplasm of breast: Secondary | ICD-10-CM | POA: Diagnosis not present

## 2021-01-21 DIAGNOSIS — Z13820 Encounter for screening for osteoporosis: Secondary | ICD-10-CM | POA: Diagnosis not present

## 2021-01-21 DIAGNOSIS — Z01419 Encounter for gynecological examination (general) (routine) without abnormal findings: Secondary | ICD-10-CM | POA: Diagnosis not present

## 2021-01-21 DIAGNOSIS — Z6841 Body Mass Index (BMI) 40.0 and over, adult: Secondary | ICD-10-CM | POA: Diagnosis not present

## 2021-01-21 DIAGNOSIS — Z01411 Encounter for gynecological examination (general) (routine) with abnormal findings: Secondary | ICD-10-CM | POA: Diagnosis not present

## 2021-01-21 DIAGNOSIS — Z124 Encounter for screening for malignant neoplasm of cervix: Secondary | ICD-10-CM | POA: Diagnosis not present

## 2021-01-26 ENCOUNTER — Other Ambulatory Visit: Payer: Self-pay | Admitting: Obstetrics

## 2021-01-26 DIAGNOSIS — Z13820 Encounter for screening for osteoporosis: Secondary | ICD-10-CM

## 2021-02-27 ENCOUNTER — Other Ambulatory Visit (HOSPITAL_COMMUNITY): Payer: Self-pay | Admitting: Physician Assistant

## 2021-03-03 DIAGNOSIS — G4733 Obstructive sleep apnea (adult) (pediatric): Secondary | ICD-10-CM | POA: Diagnosis not present

## 2021-03-03 DIAGNOSIS — Z0000 Encounter for general adult medical examination without abnormal findings: Secondary | ICD-10-CM | POA: Diagnosis not present

## 2021-03-03 DIAGNOSIS — N183 Chronic kidney disease, stage 3 unspecified: Secondary | ICD-10-CM | POA: Diagnosis not present

## 2021-03-03 DIAGNOSIS — G629 Polyneuropathy, unspecified: Secondary | ICD-10-CM | POA: Diagnosis not present

## 2021-03-03 DIAGNOSIS — E538 Deficiency of other specified B group vitamins: Secondary | ICD-10-CM | POA: Diagnosis not present

## 2021-03-03 DIAGNOSIS — Z23 Encounter for immunization: Secondary | ICD-10-CM | POA: Diagnosis not present

## 2021-03-03 DIAGNOSIS — D6869 Other thrombophilia: Secondary | ICD-10-CM | POA: Diagnosis not present

## 2021-03-03 DIAGNOSIS — F411 Generalized anxiety disorder: Secondary | ICD-10-CM | POA: Diagnosis not present

## 2021-03-03 DIAGNOSIS — I480 Paroxysmal atrial fibrillation: Secondary | ICD-10-CM | POA: Diagnosis not present

## 2021-03-03 DIAGNOSIS — I10 Essential (primary) hypertension: Secondary | ICD-10-CM | POA: Diagnosis not present

## 2021-03-17 DIAGNOSIS — G4733 Obstructive sleep apnea (adult) (pediatric): Secondary | ICD-10-CM | POA: Diagnosis not present

## 2021-03-23 ENCOUNTER — Other Ambulatory Visit: Payer: Self-pay | Admitting: Cardiology

## 2021-03-23 NOTE — Telephone Encounter (Signed)
Prescription refill request for Eliquis received.  Indication: afib  Last office visit: [place], 09/02/2020 Scr: 1.12, 05/14/2020 Age: 65 yo  Weight: 133.4 kg   Refill sent.

## 2021-04-07 ENCOUNTER — Other Ambulatory Visit: Payer: Self-pay | Admitting: Cardiology

## 2021-04-20 ENCOUNTER — Encounter: Payer: Self-pay | Admitting: Cardiology

## 2021-04-20 ENCOUNTER — Other Ambulatory Visit: Payer: Self-pay

## 2021-04-20 ENCOUNTER — Ambulatory Visit: Payer: PPO | Admitting: Cardiology

## 2021-04-20 VITALS — BP 118/64 | HR 62 | Ht 73.0 in | Wt 297.0 lb

## 2021-04-20 DIAGNOSIS — I4819 Other persistent atrial fibrillation: Secondary | ICD-10-CM | POA: Diagnosis not present

## 2021-04-20 NOTE — Progress Notes (Signed)
Electrophysiology Office Note   Date:  04/20/2021   ID:  Deborah [name], DOB 04/23/1956, MRN [id number]  PCP:  Gaynelle Arabian, MD  Cardiologist:  Irish Lack Primary Electrophysiologist:  Constance Haw, MD    No chief complaint on file.    History of Present Illness: Deborah [name] is a 65 y.o. female who presents today for electrophysiology evaluation.     She has a history significant for paroxysmal atrial fibrillation.  She is status post ablation x3, most recently 04/11/2020.  She has continued to have atrial fibrillation and is now on propafenone.  Today, denies symptoms of palpitations, chest pain, shortness of breath, orthopnea, PND, lower extremity edema, claudication, dizziness, presyncope, syncope, bleeding, or neurologic sequela. The patient is tolerating medications without difficulties.  She overall feels well.  She has no chest pain or shortness of breath.  She is noted no further episodes of atrial fibrillation.  She is overall happy with her control.  Past Medical History:  Diagnosis Date   A-fib ([place])    Anemia    hx    Anxiety    Arthritis    "was in right knee" (01/25/2018)   Dysrhythmia    atrial fib/flutter   GERD (gastroesophageal reflux disease)    occ   HTN (hypertension)    Migraine    "used to have them more frequently before menopause; now only have a couple/yr" (01/25/2018)   Obesity    OSA on CPAP    PAF (paroxysmal atrial fibrillation) ([place])    s/p DCCV 2010   Peripheral vascular disease ([place]) 2016   after flying in leg   Past Surgical History:  Procedure Laterality Date   ATRIAL FIBRILLATION ABLATION N/A 01/25/2018   Procedure: [place];  Surgeon: Constance Haw, MD;  Location: [place] [facility];  Service: Cardiovascular;  Laterality: N/A;   ATRIAL FIBRILLATION ABLATION N/A 04/11/2020   Procedure: ATRIAL FIBRILLATION ABLATION;  Surgeon: Constance Haw, MD;  Location: [place] [facility];  Service:  Cardiovascular;  Laterality: N/A;   CARDIAC CATHETERIZATION  2005   CARDIOVERSION N/A 12/09/2017   Procedure: CARDIOVERSION;  Surgeon: Sanda Klein, MD;  Location: [place] ENDOSCOPY;  Service: Cardiovascular;  Laterality: N/A;   CARDIOVERSION N/A 05/09/2018   Procedure: CARDIOVERSION;  Surgeon: Josue Hector, MD;  Location: [facility] ENDOSCOPY;  Service: Cardiovascular;  Laterality: N/A;   CARDIOVERSION N/A 01/03/2019   Procedure: CARDIOVERSION;  Surgeon: Thayer Headings, MD;  Location: [facility] ENDOSCOPY;  Service: Cardiovascular;  Laterality: N/A;   CARDIOVERSION N/A 05/27/2020   Procedure: CARDIOVERSION;  Surgeon: Freada Bergeron, MD;  Location: [facility] ENDOSCOPY;  Service: Cardiovascular;  Laterality: N/A;   CATARACT EXTRACTION W/ INTRAOCULAR LENS IMPLANT Left 2016   ELECTROPHYSIOLOGIC STUDY N/A 12/04/2015   Procedure: Atrial Fibrillation Ablation;  Surgeon: Mont Jagoda Meredith Leeds, MD;  Location: [place] [facility];  Service: Cardiovascular;  Laterality: N/A;   JOINT REPLACEMENT     TONSILLECTOMY AND ADENOIDECTOMY     TOTAL KNEE ARTHROPLASTY Right 04/23/2016   Procedure: TOTAL KNEE ARTHROPLASTY;  Surgeon: [name], MD;  Location: [place];  Service: Orthopedics;  Laterality: Right;   TUBAL LIGATION       Current Outpatient Medications  Medication Sig Dispense Refill   acetaminophen (TYLENOL) 500 MG tablet Take 1,000 mg by mouth every 6 (six) hours as needed for moderate pain or headache.     apixaban (ELIQUIS) 5 MG TABS tablet TAKE 1 TABLET BY MOUTH TWICE A DAY 180 tablet 0  calcium carbonate (TUMS - DOSED IN MG ELEMENTAL CALCIUM) 500 MG chewable tablet Chew 3 tablets by mouth daily as needed for indigestion or heartburn.      Cholecalciferol (VITAMIN D3) 125 MCG (5000 UT) TABS Take 5,000 Units by mouth daily.      escitalopram (LEXAPRO) 10 MG tablet Take 10 mg by mouth every evening.   3   metoprolol succinate (TOPROL-XL) 50 MG 24 hr tablet TAKE 1 TABLET BY MOUTH DAILY. TAKE WITH OR IMMEDIATELY  FOLLOWING A MEAL. 90 tablet 0   Polyethyl Glycol-Propyl Glycol (SYSTANE) 0.4-0.3 % SOLN Place 1 drop into both eyes 3 (three) times daily as needed (dry/irritated eyes.).     propafenone (RYTHMOL SR) 325 MG 12 hr capsule TAKE 1 CAPSULE (325 MG TOTAL) BY MOUTH 2 (TWO) TIMES DAILY. 180 capsule 2   vitamin B-12 (CYANOCOBALAMIN) 1000 MCG tablet Take 1,000 mcg by mouth daily.     No current facility-administered medications for this visit.    Allergies:   Benazepril, Adhesive [tape], and Sulfa antibiotics   Social History:  The patient  reports that she has never smoked. She has never used smokeless tobacco. She reports current alcohol use of about 1.0 standard drink per week. She reports that she does not use drugs.   Family History:  The patient's family history includes Heart disease in her mother; Stomach cancer in her paternal grandfather.   ROS:  Please see the history of present illness.   Otherwise, review of systems is positive for none.   All other systems are reviewed and negative.   PHYSICAL EXAM: VS:  BP 118/64   Pulse 62   Ht 6\' 1"  (1.854 m)   Wt 297 lb (134.7 kg)   SpO2 97%   BMI 39.18 kg/m  , BMI Body mass index is 39.18 kg/m. GEN: Well nourished, well developed, in no acute distress  HEENT: normal  Neck: no JVD, carotid bruits, or masses Cardiac: RRR; no murmurs, rubs, or gallops,no edema  Respiratory:  clear to auscultation bilaterally, normal work of breathing GI: soft, nontender, nondistended, + BS MS: no deformity or atrophy  Skin: warm and dry Neuro:  Strength and sensation are intact Psych: euthymic mood, full affect  EKG:  EKG is ordered today. Personal review of the ekg ordered shows sinus rhythm, right bundle branch block  Recent Labs: 05/14/2020: BUN 12; Creatinine, Ser 1.12; Hemoglobin 13.4; Platelets 236; Potassium 4.4; Sodium 139    Lipid Panel  No results found for: CHOL, TRIG, HDL, CHOLHDL, VLDL, LDLCALC, LDLDIRECT   Wt Readings from Last 3  Encounters:  04/20/21 297 lb (134.7 kg)  09/02/20 294 lb (133.4 kg)  07/22/20 293 lb (132.9 kg)      Other studies Reviewed: Additional studies/ records that were reviewed today include: TTE 11/25/15 - Left ventricle: Systolic function was normal. The estimated   ejection fraction was in the range of 50% to 55%. The study is   not technically sufficient to allow evaluation of LV diastolic   function. - Left atrium: The appendage was moderately dilated. - Atrial septum: No defect or patent foramen ovale was identified.   ASSESSMENT AND PLAN:  1.  Persistent atrial fibrillation: Status post ablation x3, most recently 04/11/2020.  Currently on propafenone 325 mg twice daily, Toprol-XL 50 mg daily, Eliquis 5 mg twice daily.  High risk medication monitoring.  She fortunately remains in sinus rhythm.  She does have a rate bundle-branch block but this is remained stable despite her propafenone.  No changes.  2.  Hypertension: Currently well controlled  3. Obstructive sleep apnea: CPAP compliance encouraged  Current medicines are reviewed at length with the patient today.   The patient does not have concerns regarding her medicines.  The following changes were made today: None  Labs/ tests ordered today include:  Orders Placed This Encounter  Procedures   EKG 12-Lead      Disposition:   FU with Legacie Dillingham 6 months  Signed, Angelize Ryce Meredith Leeds, MD  04/20/2021 9:53 AM     [place] [address] [place] [address] [phone] (office) [phone] (fax)

## 2021-06-21 ENCOUNTER — Other Ambulatory Visit: Payer: Self-pay | Admitting: Cardiology

## 2021-06-23 NOTE — Telephone Encounter (Signed)
Pt last saw Dr Curt Bears 04/20/21, last labs 03/03/21 Creat 1.1 at [facility] per [place], age 65, weight 134.7kg, based on specified criteria pt is on appropriate dosage of Eliquis 5mg  BID for afib.  Will refill rx.

## 2021-07-06 ENCOUNTER — Other Ambulatory Visit: Payer: Self-pay | Admitting: Cardiology

## 2021-07-09 DIAGNOSIS — D2262 Melanocytic nevi of left upper limb, including shoulder: Secondary | ICD-10-CM | POA: Diagnosis not present

## 2021-07-09 DIAGNOSIS — L821 Other seborrheic keratosis: Secondary | ICD-10-CM | POA: Diagnosis not present

## 2021-07-09 DIAGNOSIS — D225 Melanocytic nevi of trunk: Secondary | ICD-10-CM | POA: Diagnosis not present

## 2021-07-09 DIAGNOSIS — L820 Inflamed seborrheic keratosis: Secondary | ICD-10-CM | POA: Diagnosis not present

## 2021-07-10 DIAGNOSIS — D485 Neoplasm of uncertain behavior of skin: Secondary | ICD-10-CM | POA: Diagnosis not present

## 2021-07-10 DIAGNOSIS — L820 Inflamed seborrheic keratosis: Secondary | ICD-10-CM | POA: Diagnosis not present

## 2021-07-30 DIAGNOSIS — G4733 Obstructive sleep apnea (adult) (pediatric): Secondary | ICD-10-CM | POA: Diagnosis not present

## 2021-08-05 ENCOUNTER — Ambulatory Visit
Admission: RE | Admit: 2021-08-05 | Discharge: 2021-08-05 | Disposition: A | Discharge status: Home / Self Care | Payer: PPO | Source: Ambulatory Visit | Attending: Obstetrics | Admitting: Obstetrics

## 2021-08-05 DIAGNOSIS — Z13820 Encounter for screening for osteoporosis: Secondary | ICD-10-CM

## 2021-08-05 DIAGNOSIS — Z780 Asymptomatic menopausal state: Secondary | ICD-10-CM | POA: Diagnosis not present

## 2021-08-31 ENCOUNTER — Telehealth: Payer: Self-pay

## 2021-08-31 NOTE — Telephone Encounter (Signed)
Patient with diagnosis of A Fib on Eliquis for anticoagulation.   ? ?Procedure: APICOECTOMY AND BONE GRAFT ?Date of procedure: 09/10/21              ? ? ?CHA2DS2-VASc Score = 3  ?This indicates a 3.2% annual risk of stroke. ?The patient's score is based upon: ?CHF History: 0 ?HTN History: 1 ?Diabetes History: 0 ?Stroke History: 0 ?Vascular Disease History: 0 ?Age Score: 1 ?Gender Score: 1 ?  ? ?CrCl 68 mL/min using adjusted body weight ?Platelet count overdue ? ?Patient does not require pre-op antibiotics for dental procedure. ? ?Per office protocol, patient can hold Eliquis for 2 days prior to procedure.   ?

## 2021-08-31 NOTE — Telephone Encounter (Signed)
Primary Cardiologist:None ? ?Chart reviewed as part of pre-operative protocol coverage. Because of Deborah [name]'s past medical history and time since last visit, she will require a telephone/virtual visit in order to better assess preoperative cardiovascular risk. ? ?Pre-op covering staff: ?- Please contact patient, obtain consent, and schedule appointment  ? ?Routing to pharmacy pool for input on holding anticoagulant agent as requested below so that this information is available at time of patient's appointment.  ? ?Procedure:   APICOECTOMY AND BONE GRAFT ?Date of Surgery:  Clearance 09/10/21                              ?Surgeon:  DR. Heloise Purpura ?Surgeon's Group or Practice Name:  [facility] [place] [place] ?Phone number:  [phone] ?Fax number:  [phone] ?Type of Clearance Requested:   ?- Pharmacy:  Hold Apixaban (Eliquis) HOLD 2 DAYS BEFORE ?  ?Type of Anesthesia:  Local  ?

## 2021-08-31 NOTE — Telephone Encounter (Signed)
? ?  Pre-operative Risk Assessment  ?  ?Patient Name: Deborah [name]  ?DOB: 1956/05/29 ?MRN: [id number]  ? ? ? ?Request for Surgical Clearance   ? ?Procedure:   APICOECTOMY AND BONE GRAFT ? ?Date of Surgery:  Clearance 09/10/21                              ?   ?Surgeon:  DR. Heloise Purpura ?Surgeon's Group or Practice Name:  [facility] [place] [place] ?Phone number:  [phone] ?Fax number:  [phone] ?  ?Type of Clearance Requested:   ?- Pharmacy:  Hold Apixaban (Eliquis) HOLD 2 DAYS BEFORE ?  ?Type of Anesthesia:  Local  ?  ?Additional requests/questions:   ? ?Signed, ?Jacinta Shoe   ?08/31/2021, 3:07 PM  ? ?

## 2021-08-31 NOTE — Telephone Encounter (Signed)
Left message for the pt to call back for tele appt  

## 2021-09-01 ENCOUNTER — Telehealth: Payer: Self-pay | Admitting: Nurse Practitioner

## 2021-09-01 ENCOUNTER — Ambulatory Visit (INDEPENDENT_AMBULATORY_CARE_PROVIDER_SITE_OTHER): Payer: PPO | Admitting: Nurse Practitioner

## 2021-09-01 DIAGNOSIS — H353112 Nonexudative age-related macular degeneration, right eye, intermediate dry stage: Secondary | ICD-10-CM | POA: Diagnosis not present

## 2021-09-01 DIAGNOSIS — H318 Other specified disorders of choroid: Secondary | ICD-10-CM | POA: Diagnosis not present

## 2021-09-01 DIAGNOSIS — H3581 Retinal edema: Secondary | ICD-10-CM | POA: Diagnosis not present

## 2021-09-01 DIAGNOSIS — H26492 Other secondary cataract, left eye: Secondary | ICD-10-CM | POA: Diagnosis not present

## 2021-09-01 DIAGNOSIS — Z961 Presence of intraocular lens: Secondary | ICD-10-CM | POA: Diagnosis not present

## 2021-09-01 DIAGNOSIS — Z01810 Encounter for preprocedural cardiovascular examination: Secondary | ICD-10-CM | POA: Diagnosis not present

## 2021-09-01 DIAGNOSIS — H35052 Retinal neovascularization, unspecified, left eye: Secondary | ICD-10-CM | POA: Diagnosis not present

## 2021-09-01 DIAGNOSIS — Z7901 Long term (current) use of anticoagulants: Secondary | ICD-10-CM | POA: Diagnosis not present

## 2021-09-01 DIAGNOSIS — I4891 Unspecified atrial fibrillation: Secondary | ICD-10-CM | POA: Diagnosis not present

## 2021-09-01 DIAGNOSIS — H2511 Age-related nuclear cataract, right eye: Secondary | ICD-10-CM | POA: Diagnosis not present

## 2021-09-01 NOTE — Telephone Encounter (Signed)
?  Patient Consent for Virtual Visit  ? ? ? { ? ? ?[name] has provided verbal consent on 09/01/2021 for a virtual visit (video or telephone). ? ? ?CONSENT FOR VIRTUAL VISIT FOR:  Deborah [name]  ?By participating in this virtual visit I agree to the following: ? ?I hereby voluntarily request, consent and authorize [facility] and its employed or contracted physicians, physician assistants, nurse practitioners or other licensed health care professionals (the Practitioner), to provide me with telemedicine health care services (the ?Services") as deemed necessary by the treating Practitioner. I acknowledge and consent to receive the Services by the Practitioner via telemedicine. I understand that the telemedicine visit will involve communicating with the Practitioner through live audiovisual communication technology and the disclosure of certain medical information by electronic transmission. I acknowledge that I have been given the opportunity to request an in-person assessment or other available alternative prior to the telemedicine visit and am voluntarily participating in the telemedicine visit. ? ?I understand that I have the right to withhold or withdraw my consent to the use of telemedicine in the course of my care at any time, without affecting my right to future care or treatment, and that the Practitioner or I may terminate the telemedicine visit at any time. I understand that I have the right to inspect all information obtained and/or recorded in the course of the telemedicine visit and may receive copies of available information for a reasonable fee.  I understand that some of the potential risks of receiving the Services via telemedicine include:  ?Delay or interruption in medical evaluation due to technological equipment failure or disruption; ?Information transmitted may not be sufficient (e.g. poor resolution of images) to allow for appropriate medical decision making by the Practitioner; and/or   ?In rare instances, security protocols could fail, causing a breach of personal health information. ? ?Furthermore, I acknowledge that it is my responsibility to provide information about my medical history, conditions and care that is complete and accurate to the best of my ability. I acknowledge that Practitioner's advice, recommendations, and/or decision may be based on factors not within their control, such as incomplete or inaccurate data provided by me or distortions of diagnostic images or specimens that may result from electronic transmissions. I understand that the practice of medicine is not an exact science and that Practitioner makes no warranties or guarantees regarding treatment outcomes. I acknowledge that a copy of this consent can be made available to me via my patient portal ([facility]), or I can request a printed copy by calling the office of [facility].   ? ?I understand that my insurance will be billed for this visit.  ? ?I have read or had this consent read to me. ?I understand the contents of this consent, which adequately explains the benefits and risks of the Services being provided via telemedicine.  ?I have been provided ample opportunity to ask questions regarding this consent and the Services and have had my questions answered to my satisfaction. ?I give my informed consent for the services to be provided through the use of telemedicine in my medical care ? ? ? ?

## 2021-09-01 NOTE — Telephone Encounter (Signed)
Pt scheduled incorrectly on PharmD schedule for preop clearance. This needs to be with APP instead. ?

## 2021-09-01 NOTE — Telephone Encounter (Signed)
Pre op team aware scheduler, scheduled this appt incorrectly. Per Christen Bame, NP she will call the pt and take care of clearance.  ?

## 2021-09-01 NOTE — Telephone Encounter (Signed)
Patient called back. She is scheduled 3/28 for a virtual pharmd appointment. She would like to know if she can be worked in sooner since her dental procedure is 3/16. ?

## 2021-09-01 NOTE — Progress Notes (Addendum)
Virtual Visit via Telephone Note   This visit type was conducted due to national recommendations for restrictions regarding the COVID-19 Pandemic (e.g. social distancing) in an effort to limit this patient's exposure and mitigate transmission in our community.  Due to her co-morbid illnesses, this patient is at least at moderate risk for complications without adequate follow up.  This format is felt to be most appropriate for this patient at this time.  The patient did not have access to video technology/had technical difficulties with video requiring transitioning to audio format only (telephone).  All issues noted in this document were discussed and addressed.  No physical exam could be performed with this format.  Please refer to the patient's chart for her  consent to telehealth for [facility]. Evaluation Performed:  Preoperative cardiovascular risk assessment  This visit type was conducted due to national recommendations for restrictions regarding the COVID-19 Pandemic (e.g. social distancing).  This format is felt to be most appropriate for this patient at this time.  All issues noted in this document were discussed and addressed.  No physical exam was performed (except for noted visual exam findings with Video Visits).  Please refer to the patient's chart (MyChart message for video visits and phone note for telephone visits) for the patient's consent to telehealth for [facility]. _____________   Date:  09/01/2021   Patient ID:  Deborah [name], DOB 15-Oct-1955, MRN [id number] Patient Location:  Home Provider location:   Office  Primary Care Provider:  Gaynelle Arabian, MD Primary Cardiologist:  None  Chief Complaint    66 y.o. y/o female with a h/o persistent atrial fibrillation on chronic anticoagulation, hypertension, anxiety who is pending Apicoectomy and bone graft, and presents today for telephonic preoperative cardiovascular risk assessment.  Past Medical History    Past  Medical History:  Diagnosis Date   A-fib ([place])    Anemia    hx    Anxiety    Arthritis    "was in right knee" (01/25/2018)   Dysrhythmia    atrial fib/flutter   GERD (gastroesophageal reflux disease)    occ   HTN (hypertension)    Migraine    "used to have them more frequently before menopause; now only have a couple/yr" (01/25/2018)   Obesity    OSA on CPAP    PAF (paroxysmal atrial fibrillation) ([place])    s/p DCCV 2010   Peripheral vascular disease ([place]) 2016   after flying in leg   Past Surgical History:  Procedure Laterality Date   ATRIAL FIBRILLATION ABLATION N/A 01/25/2018   Procedure: [place];  Surgeon: Constance Haw, MD;  Location: [place] CV LAB;  Service: Cardiovascular;  Laterality: N/A;   ATRIAL FIBRILLATION ABLATION N/A 04/11/2020   Procedure: ATRIAL FIBRILLATION ABLATION;  Surgeon: Constance Haw, MD;  Location: [place] CV LAB;  Service: Cardiovascular;  Laterality: N/A;   CARDIAC CATHETERIZATION  2005   CARDIOVERSION N/A 12/09/2017   Procedure: CARDIOVERSION;  Surgeon: Sanda Klein, MD;  Location: [place] ENDOSCOPY;  Service: Cardiovascular;  Laterality: N/A;   CARDIOVERSION N/A 05/09/2018   Procedure: CARDIOVERSION;  Surgeon: Josue Hector, MD;  Location[facility] ENDOSCOPY;  Service: Cardiovascular;  Laterality: N/A;   CARDIOVERSION N/A 01/03/2019   Procedure: CARDIOVERSION;  Surgeon: Thayer Headings, MD;  Location: [facility] ENDOSCOPY;  Service: Cardiovascular;  Laterality: N/A;   CARDIOVERSION N/A 05/27/2020   Procedure: CARDIOVERSION;  Surgeon: Freada Bergeron, MD;  Location: [place];  Service: Cardiovascular;  Laterality: N/A;  CATARACT EXTRACTION W/ INTRAOCULAR LENS IMPLANT Left 2016   ELECTROPHYSIOLOGIC STUDY N/A 12/04/2015   Procedure: Atrial Fibrillation Ablation;  Surgeon: Will Meredith Leeds, MD;  Location: [place] CV LAB;  Service: Cardiovascular;  Laterality: N/A;   JOINT REPLACEMENT     TONSILLECTOMY AND  ADENOIDECTOMY     TOTAL KNEE ARTHROPLASTY Right 04/23/2016   Procedure: TOTAL KNEE ARTHROPLASTY;  Surgeon: [name], MD;  Location: [place];  Service: Orthopedics;  Laterality: Right;   TUBAL LIGATION      Allergies  Allergies  Allergen Reactions   Benazepril Cough   Adhesive [Tape] Rash and Other (See Comments)    bandaides   Sulfa Antibiotics Rash    History of Present Illness    Deborah [name] is a 66 y.o. female who presents via audio/video conferencing for a telehealth visit today.  Pt was last seen in cardiology clinic on 04/20/21, by Dr. Curt Bears.  At that time Deborah [name] was doing well.  She is now pending apicoectomy and bone graft.  Since his last visit, she denies complaints including dyspnea, chest pain, palpitations, or syncope. She is active at home without concern for worsening activity tolerance.    Home Medications    Prior to Admission medications   Medication Sig Start Date End Date Taking? Authorizing Provider  acetaminophen (TYLENOL) 500 MG tablet Take 1,000 mg by mouth every 6 (six) hours as needed for moderate pain or headache.    [provider]  apixaban (ELIQUIS) 5 MG TABS tablet TAKE 1 TABLET BY MOUTH TWICE A DAY 06/23/21   Camnitz, Ocie Doyne, MD  calcium carbonate (TUMS - DOSED IN MG ELEMENTAL CALCIUM) 500 MG chewable tablet Chew 3 tablets by mouth daily as needed for indigestion or heartburn.     [provider]  Cholecalciferol (VITAMIN D3) 125 MCG (5000 UT) TABS Take 5,000 Units by mouth daily.     [provider]  escitalopram (LEXAPRO) 10 MG tablet Take 10 mg by mouth every evening.  06/17/15   [provider]  metoprolol succinate (TOPROL-XL) 50 MG 24 hr tablet TAKE 1 TABLET BY MOUTH EVERY DAY WITH OR IMMEDIATELY FOLLOWING A MEAL 07/06/21   Camnitz, Ocie Doyne, MD  Polyethyl Glycol-Propyl Glycol (SYSTANE) 0.4-0.3 % SOLN Place 1 drop into both eyes 3 (three) times daily as needed (dry/irritated eyes.).     [provider]  propafenone (RYTHMOL SR) 325 MG 12 hr capsule TAKE 1 CAPSULE (325 MG TOTAL) BY MOUTH 2 (TWO) TIMES DAILY. 02/27/21   Fenton, Clint R, PA  vitamin B-12 (CYANOCOBALAMIN) 1000 MCG tablet Take 1,000 mcg by mouth daily.    [provider]    Physical Exam    Vital Signs:  Deborah [name] does not have vital signs available for review today.  Given telephonic nature of communication, physical exam is limited. AAOx3. NAD. Normal affect.  Speech and respirations are unlabored.  Accessory Clinical Findings    None  Assessment & Plan    1.  Preoperative Cardiovascular Risk Assessment: Patient is doing well and has no specific cardiac concerns. She may proceed with surgery without further cardiac testing. According to the Revised Cardiac Risk Index (RCRI), her Perioperative Risk of Major Cardiac Event is (%): 0.4 Her Functional Capacity in METs is: 5.62 according to the Duke Activity Status Index (DASI).  Guidelines for holding Eliquis from clinical pharmacist are as follows:  CHA2DS2-VASc Score = 3  This indicates a 3.2% annual risk of stroke. The patient's score  is based upon: CHF History: 0 HTN History: 1 Diabetes History: 0 Stroke History: 0 Vascular Disease History: 0 Age Score: 1 Gender Score: 1    CrCl 68 mL/min using adjusted body weight Platelet count overdue   Patient does not require pre-op antibiotics for dental procedure.   Per office protocol, patient can hold Eliquis for 2 days prior to procedure.    COVID-19 Education: The signs and symptoms of COVID-19 were discussed with the patient and how to seek care for testing (follow up with PCP or arrange E-visit).  The importance of social distancing was discussed today.  Patient Risk:   After full review of this patient's history and clinical status, I feel that he is at least moderate risk for cardiac complications at this time, thus necessitating a telehealth visit sooner than our first  available in office visit.  Time:   Today, I have spent 10 minutes with the patient with telehealth technology discussing medical history, symptoms, and management plan.     Emmaline Life, NP-C    09/01/2021, 11:42 AM [place] [address], [address] Office [phone] Fax [phone]

## 2021-09-15 ENCOUNTER — Encounter: Payer: Self-pay | Admitting: Nurse Practitioner

## 2021-09-22 ENCOUNTER — Telehealth: Payer: PPO

## 2021-09-23 ENCOUNTER — Telehealth: Payer: Self-pay | Admitting: Nurse Practitioner

## 2021-09-23 NOTE — Telephone Encounter (Signed)
Called the patient and left vm msg to advise there will not be a no show charge. ?

## 2021-09-23 NOTE — Telephone Encounter (Signed)
Pt received a letter stating that she no showed to a telephone visit... there are notes on the patients account that reflects she did complete the visit... please correct this for the patient ?

## 2021-10-29 DIAGNOSIS — G4733 Obstructive sleep apnea (adult) (pediatric): Secondary | ICD-10-CM | POA: Diagnosis not present

## 2021-12-28 ENCOUNTER — Other Ambulatory Visit (HOSPITAL_COMMUNITY): Payer: Self-pay | Admitting: Physician Assistant

## 2022-01-06 ENCOUNTER — Encounter: Payer: Self-pay | Admitting: Cardiology

## 2022-01-06 ENCOUNTER — Ambulatory Visit: Payer: PPO | Admitting: Cardiology

## 2022-01-06 VITALS — BP 108/66 | HR 61 | Ht 73.0 in | Wt 293.0 lb

## 2022-01-06 DIAGNOSIS — I4819 Other persistent atrial fibrillation: Secondary | ICD-10-CM

## 2022-01-06 DIAGNOSIS — D6869 Other thrombophilia: Secondary | ICD-10-CM

## 2022-01-06 NOTE — Patient Instructions (Signed)
Medication Instructions:  Your physician recommends that you continue on your current medications as directed. Please refer to the Current Medication list given to you today.  *If you need a refill on your cardiac medications before your next appointment, please call your pharmacy*   Lab Work: None ordered   Testing/Procedures: None ordered   Follow-Up: At [facility], you and your health needs are our priority.  As part of our continuing mission to provide you with exceptional heart care, we have created designated Provider Care Teams.  These Care Teams include your primary Cardiologist (physician) and Advanced Practice Providers (APPs -  Physician Assistants and Nurse Practitioners) who all work together to provide you with the care you need, when you need it.  Your next appointment:   6 month(s)  The format for your next appointment:   In Person  Provider:   Will Camnitz, MD    Thank you for choosing [facility]!!   Tallulah Hosman, RN [phone]  Other Instructions   Important Information About Sugar           

## 2022-01-06 NOTE — Progress Notes (Signed)
Electrophysiology Office Note   Date:  01/06/2022   ID:  Deborah [name], DOB [birth year], MRN [id number]  PCP:  Gaynelle Arabian, MD  Cardiologist:  Irish Lack Primary Electrophysiologist:  Constance Haw, MD    No chief complaint on file.     History of Present Illness: Deborah [name] is a 66 y.o. female who presents today for electrophysiology evaluation.     She has a history significant for paroxysmal atrial fibrillation.  She is status post ablation x3 most recently 04/11/2020.  She continued to have episodes of atrial fibrillation and is now on propafenone.  Today, denies symptoms of palpitations, chest pain, shortness of breath, orthopnea, PND, lower extremity edema, claudication, dizziness, presyncope, syncope, bleeding, or neurologic sequela. The patient is tolerating medications without difficulties.  Since being seen she has done well.  She has had no further episodes of atrial fibrillation.  She is overall quite happy with her control.  She is excited that her son and daughter-in-law are due in September with her first granddaughter.  Past Medical History:  Diagnosis Date   A-fib ([place])    Anemia    hx    Anxiety    Arthritis    "was in right knee" (01/25/2018)   Dysrhythmia    atrial fib/flutter   GERD (gastroesophageal reflux disease)    occ   HTN (hypertension)    Migraine    "used to have them more frequently before menopause; now only have a couple/yr" (01/25/2018)   Obesity    OSA on CPAP    PAF (paroxysmal atrial fibrillation) ([place])    s/p DCCV 2010   Peripheral vascular disease ([place]) 2016   after flying in leg   Past Surgical History:  Procedure Laterality Date   ATRIAL FIBRILLATION ABLATION N/A 01/25/2018   Procedure: [place];  Surgeon: Constance Haw, MD;  Location: [place] CV LAB;  Service: Cardiovascular;  Laterality: N/A;   ATRIAL FIBRILLATION ABLATION N/A 04/11/2020   Procedure: ATRIAL FIBRILLATION ABLATION;   Surgeon: Constance Haw, MD;  Location: [place] CV LAB;  Service: Cardiovascular;  Laterality: N/A;   CARDIAC CATHETERIZATION  2005   CARDIOVERSION N/A 12/09/2017   Procedure: CARDIOVERSION;  Surgeon: Sanda Klein, MD;  Location: [place] ENDOSCOPY;  Service: Cardiovascular;  Laterality: N/A;   CARDIOVERSION N/A 05/09/2018   Procedure: CARDIOVERSION;  Surgeon: Josue Hector, MD;  Location: [facility] ENDOSCOPY;  Service: Cardiovascular;  Laterality: N/A;   CARDIOVERSION N/A 01/03/2019   Procedure: CARDIOVERSION;  Surgeon: Thayer Headings, MD;  Location: [facility] ENDOSCOPY;  Service: Cardiovascular;  Laterality: N/A;   CARDIOVERSION N/A 05/27/2020   Procedure: CARDIOVERSION;  Surgeon: Freada Bergeron, MD;  Location: [facility] ENDOSCOPY;  Service: Cardiovascular;  Laterality: N/A;   CATARACT EXTRACTION W/ INTRAOCULAR LENS IMPLANT Left 2016   ELECTROPHYSIOLOGIC STUDY N/A 12/04/2015   Procedure: Atrial Fibrillation Ablation;  Surgeon: Chukwudi Ewen Meredith Leeds, MD;  Location: [place] CV LAB;  Service: Cardiovascular;  Laterality: N/A;   JOINT REPLACEMENT     TONSILLECTOMY AND ADENOIDECTOMY     TOTAL KNEE ARTHROPLASTY Right 04/23/2016   Procedure: TOTAL KNEE ARTHROPLASTY;  Surgeon: [name], MD;  Location: [place];  Service: Orthopedics;  Laterality: Right;   TUBAL LIGATION       Current Outpatient Medications  Medication Sig Dispense Refill   acetaminophen (TYLENOL) 500 MG tablet Take 1,000 mg by mouth every 6 (six) hours as needed for moderate pain or headache.     amoxicillin-clavulanate (AUGMENTIN) 875-125 MG  tablet Take 1 tablet by mouth 2 (two) times daily.     apixaban (ELIQUIS) 5 MG TABS tablet TAKE 1 TABLET BY MOUTH TWICE A DAY 180 tablet 1   calcium carbonate (TUMS - DOSED IN MG ELEMENTAL CALCIUM) 500 MG chewable tablet Chew 3 tablets by mouth daily as needed for indigestion or heartburn.      Cholecalciferol (VITAMIN D3) 125 MCG (5000 UT) TABS Take 5,000 Units by mouth daily.       escitalopram (LEXAPRO) 10 MG tablet Take 10 mg by mouth every evening.   3   metoprolol succinate (TOPROL-XL) 50 MG 24 hr tablet TAKE 1 TABLET BY MOUTH EVERY DAY WITH OR IMMEDIATELY FOLLOWING A MEAL 90 tablet 2   Multiple Vitamins-Minerals (ICAPS AREDS FORMULA PO) Take 1 tablet by mouth in the morning and at bedtime.     Polyethyl Glycol-Propyl Glycol (SYSTANE) 0.4-0.3 % SOLN Place 1 drop into both eyes 3 (three) times daily as needed (dry/irritated eyes.).     propafenone (RYTHMOL SR) 325 MG 12 hr capsule TAKE 1 CAPSULE (325 MG TOTAL) BY MOUTH 2 (TWO) TIMES DAILY. 180 capsule 2   vitamin B-12 (CYANOCOBALAMIN) 1000 MCG tablet Take 1,000 mcg by mouth daily.     No current facility-administered medications for this visit.    Allergies:   Benazepril, Adhesive [tape], and Sulfa antibiotics   Social History:  The patient  reports that she has never smoked. She has never used smokeless tobacco. She reports current alcohol use of about 1.0 standard drink of alcohol per week. She reports that she does not use drugs.   Family History:  The patient's family history includes Heart disease in her mother; Stomach cancer in her paternal grandfather.   ROS:  Please see the history of present illness.   Otherwise, review of systems is positive for none.   All other systems are reviewed and negative.   PHYSICAL EXAM: VS:  There were no vitals taken for this visit. , BMI There is no height or weight on file to calculate BMI. GEN: Well nourished, well developed, in no acute distress  HEENT: normal  Neck: no JVD, carotid bruits, or masses Cardiac: RRR; no murmurs, rubs, or gallops,no edema  Respiratory:  clear to auscultation bilaterally, normal work of breathing GI: soft, nontender, nondistended, + BS MS: no deformity or atrophy  Skin: warm and dry Neuro:  Strength and sensation are intact Psych: euthymic mood, full affect  EKG:  EKG is ordered today. Personal review of the ekg ordered shows sinus  rhythm, RBBB   Recent Labs: No results found for requested labs within last 365 days.    Lipid Panel  No results found for: "CHOL", "TRIG", "HDL", "CHOLHDL", "VLDL", "LDLCALC", "LDLDIRECT"   Wt Readings from Last 3 Encounters:  04/20/21 297 lb (134.7 kg)  09/02/20 294 lb (133.4 kg)  07/22/20 293 lb (132.9 kg)      Other studies Reviewed: Additional studies/ records that were reviewed today include: TTE 11/25/15 - Left ventricle: Systolic function was normal. The estimated   ejection fraction was in the range of 50% to 55%. The study is   not technically sufficient to allow evaluation of LV diastolic   function. - Left atrium: The appendage was moderately dilated. - Atrial septum: No defect or patent foramen ovale was identified.   ASSESSMENT AND PLAN:  1.  Persistent atrial fibrillation: Status post ablation x3, most recently 04/11/2020.  Currently on propafenone 325 mg twice daily, Toprol-XL 50 mg daily, Eliquis  5 mg twice daily.  High risk medication monitoring for propafenone via ECG today.  Has a right bundle branch block but is remained stable on her propafenone.  No changes.  2.  Hypertension:well controlled  3.  Obstructive sleep apnea: CPAP compliance encouraged  Current medicines are reviewed at length with the patient today.   The patient does not have concerns regarding her medicines.  The following changes were made today: none  Labs/ tests ordered today include:  No orders of the defined types were placed in this encounter.     Disposition:   FU with Ryland Smoots 6 months  Signed, Aanshi Batchelder Meredith Leeds, MD  01/06/2022 9:05 AM     [facility] [address] [place] [address] [phone] (office) [phone] (fax)

## 2022-01-29 DIAGNOSIS — G4733 Obstructive sleep apnea (adult) (pediatric): Secondary | ICD-10-CM | POA: Diagnosis not present

## 2022-02-17 ENCOUNTER — Encounter: Payer: Self-pay | Admitting: Cardiology

## 2022-02-23 DIAGNOSIS — Z01411 Encounter for gynecological examination (general) (routine) with abnormal findings: Secondary | ICD-10-CM | POA: Diagnosis not present

## 2022-02-23 DIAGNOSIS — Z0142 Encounter for cervical smear to confirm findings of recent normal smear following initial abnormal smear: Secondary | ICD-10-CM | POA: Diagnosis not present

## 2022-02-23 DIAGNOSIS — Z6841 Body Mass Index (BMI) 40.0 and over, adult: Secondary | ICD-10-CM | POA: Diagnosis not present

## 2022-02-23 DIAGNOSIS — Z124 Encounter for screening for malignant neoplasm of cervix: Secondary | ICD-10-CM | POA: Diagnosis not present

## 2022-02-23 DIAGNOSIS — N951 Menopausal and female climacteric states: Secondary | ICD-10-CM | POA: Diagnosis not present

## 2022-02-23 DIAGNOSIS — Z1231 Encounter for screening mammogram for malignant neoplasm of breast: Secondary | ICD-10-CM | POA: Diagnosis not present

## 2022-02-23 DIAGNOSIS — Z01419 Encounter for gynecological examination (general) (routine) without abnormal findings: Secondary | ICD-10-CM | POA: Diagnosis not present

## 2022-02-26 ENCOUNTER — Ambulatory Visit (HOSPITAL_COMMUNITY)
Admission: RE | Admit: 2022-02-26 | Discharge: 2022-02-26 | Disposition: A | Discharge status: Home / Self Care | Payer: PPO | Source: Ambulatory Visit | Attending: Physician Assistant | Admitting: Physician Assistant

## 2022-02-26 VITALS — BP 142/76 | HR 94 | Ht 73.0 in | Wt 294.4 lb

## 2022-02-26 DIAGNOSIS — I480 Paroxysmal atrial fibrillation: Secondary | ICD-10-CM | POA: Diagnosis not present

## 2022-02-26 DIAGNOSIS — G4733 Obstructive sleep apnea (adult) (pediatric): Secondary | ICD-10-CM | POA: Insufficient documentation

## 2022-02-26 DIAGNOSIS — I4819 Other persistent atrial fibrillation: Secondary | ICD-10-CM | POA: Diagnosis not present

## 2022-02-26 DIAGNOSIS — D6869 Other thrombophilia: Secondary | ICD-10-CM | POA: Insufficient documentation

## 2022-02-26 LAB — BASIC METABOLIC PANEL
Anion gap: 5 (ref 5–15)
BUN: 10 mg/dL (ref 8–23)
CO2: 29 mmol/L (ref 22–32)
Calcium: 9.4 mg/dL (ref 8.9–10.3)
Chloride: 107 mmol/L (ref 98–111)
Creatinine, Ser: 0.81 mg/dL (ref 0.44–1.00)
GFR, Estimated: >60 mL/min (ref >60)
Glucose, Bld: 86 mg/dL (ref 70–99)
Potassium: 4.3 mmol/L (ref 3.5–5.1)
Sodium: 141 mmol/L (ref 135–145)

## 2022-02-26 LAB — CBC
HCT: 42.6 % (ref 36.0–46.0)
Hemoglobin: 13.3 g/dL (ref 12.0–15.0)
MCH: 27.5 pg (ref 26.0–34.0)
MCHC: 31.2 g/dL (ref 30.0–36.0)
MCV: 88 fL (ref 80.0–100.0)
Platelets: 271 10*3/uL (ref 150–400)
RBC: 4.84 MIL/uL (ref 3.87–5.11)
RDW: 15.3 % (ref 11.5–15.5)
WBC: 7.4 10*3/uL (ref 4.0–10.5)
nRBC: 0 % (ref 0.0–0.2)

## 2022-02-26 LAB — MAGNESIUM: Magnesium: 2.1 mg/dL (ref 1.7–2.4)

## 2022-02-26 MED ORDER — PROPAFENONE HCL ER 325 MG PO CP12: 325.0000 mg | ORAL_CAPSULE | Freq: Two times a day (BID) | ORAL | 2 refills | Status: DC

## 2022-02-26 NOTE — Progress Notes (Signed)
Laurena Bering   Primary Care Physician: Gaynelle Arabian, MD Referring Physician: Dr. Gloris Ham Deborah [name] is a 66 y.o. female with a h/o persistent afib that is in the afib clinic for f/u on    ablation one month ago.  She has had 2 ablations in the past and several cardioversion's in the past. She remains on  Propafenone  325 mg bid.  She reports that she has not noted any afib since the procedure. She feels well. She is surprised to find out she is in afib at 84 bpm.  No swallowing or groin issues since the procedure. She  did have a fall yesterday, fell backward from a squatting position,  and is questioning if that could have thrown her out of rhythm.   F/u in afib clinic, 11/17. Ekg shows that pt continues with afib . She has noted taking her BP/HR issues since last visit,  that the pulse indicator light has been showing an irregular pulse so I feel that she is in persistent afib. I discussed a cardioversion and she is in agreement. No missed anticoagulation.   F/u successful cardioversion, 06/03/20. She continues in SR, she feels improved. No concerns voiced today.  Follow up in the AF clinic 09/01/20. Patient continued to have atrial fibrillation post repeat ablation and propafenone was resumed. Patient converted to SR with the medication. She is feeling well today. She denies any bleeding issues on anticoagulation.   Follow up in the AF clinic 02/26/22. She reports that she has been in afib for about two weeks with symptoms of fatigue and SOB with exertion. There were no specific triggers that she could identify. She called Dr Curt Bears office who recommended dofetilide admission.   Today, she denies symptoms of palpitations, chest pain, orthopnea, PND, lower extremity edema, dizziness, presyncope, syncope, or neurologic sequela. The patient is tolerating medications without difficulties and is otherwise without complaint today.   Past Medical History:  Diagnosis Date   A-fib ([place])    Anemia    hx     Anxiety    Arthritis    "was in right knee" (01/25/2018)   Dysrhythmia    atrial fib/flutter   GERD (gastroesophageal reflux disease)    occ   HTN (hypertension)    Migraine    "used to have them more frequently before menopause; now only have a couple/yr" (01/25/2018)   Obesity    OSA on CPAP    PAF (paroxysmal atrial fibrillation) ([place])    s/p DCCV 2010   Peripheral vascular disease ([place]) 2016   after flying in leg   Past Surgical History:  Procedure Laterality Date   ATRIAL FIBRILLATION ABLATION N/A 01/25/2018   Procedure: [place];  Surgeon: Constance Haw, MD;  Location: [place] CV LAB;  Service: Cardiovascular;  Laterality: N/A;   ATRIAL FIBRILLATION ABLATION N/A 04/11/2020   Procedure: ATRIAL FIBRILLATION ABLATION;  Surgeon: Constance Haw, MD;  Location: [place] CV LAB;  Service: Cardiovascular;  Laterality: N/A;   CARDIAC CATHETERIZATION  2005   CARDIOVERSION N/A 12/09/2017   Procedure: CARDIOVERSION;  Surgeon: Sanda Klein, MD;  Location: [place] [facility] ENDOSCOPY;  Service: Cardiovascular;  Laterality: N/A;   CARDIOVERSION N/A 05/09/2018   Procedure: CARDIOVERSION;  Surgeon: Josue Hector, MD;  Location: [facility] ENDOSCOPY;  Service: Cardiovascular;  Laterality: N/A;   CARDIOVERSION N/A 01/03/2019   Procedure: CARDIOVERSION;  Surgeon: Thayer Headings, MD;  Location: [facility] ENDOSCOPY;  Service: Cardiovascular;  Laterality: N/A;   CARDIOVERSION N/A 05/27/2020  Procedure: CARDIOVERSION;  Surgeon: Freada Bergeron, MD;  Location: [facility] ENDOSCOPY;  Service: Cardiovascular;  Laterality: N/A;   CATARACT EXTRACTION W/ INTRAOCULAR LENS IMPLANT Left 2016   ELECTROPHYSIOLOGIC STUDY N/A 12/04/2015   Procedure: Atrial Fibrillation Ablation;  Surgeon: Will Meredith Leeds, MD;  Location: [place] CV LAB;  Service: Cardiovascular;  Laterality: N/A;   JOINT REPLACEMENT     TONSILLECTOMY AND ADENOIDECTOMY     TOTAL KNEE ARTHROPLASTY Right 04/23/2016   Procedure:  TOTAL KNEE ARTHROPLASTY;  Surgeon: [name], MD;  Location: [place];  Service: Orthopedics;  Laterality: Right;   TUBAL LIGATION      Current Outpatient Medications  Medication Sig Dispense Refill   acetaminophen (TYLENOL) 500 MG tablet Take 1,000 mg by mouth every 6 (six) hours as needed for moderate pain or headache.     ALPRAZolam (XANAX) 0.5 MG tablet Take 0.5 mg by mouth as needed for anxiety.     apixaban (ELIQUIS) 5 MG TABS tablet TAKE 1 TABLET BY MOUTH TWICE A DAY 180 tablet 1   calcium carbonate (TUMS - DOSED IN MG ELEMENTAL CALCIUM) 500 MG chewable tablet Chew 3 tablets by mouth daily as needed for indigestion or heartburn.      Cholecalciferol (VITAMIN D3) 125 MCG (5000 UT) TABS Take 5,000 Units by mouth daily.      escitalopram (LEXAPRO) 10 MG tablet Take 10 mg by mouth every evening.   3   metoprolol succinate (TOPROL-XL) 50 MG 24 hr tablet TAKE 1 TABLET BY MOUTH EVERY DAY WITH OR IMMEDIATELY FOLLOWING A MEAL 90 tablet 2   Multiple Vitamins-Minerals (ICAPS AREDS FORMULA PO) Take 1 tablet by mouth in the morning and at bedtime.     Polyethyl Glycol-Propyl Glycol (SYSTANE) 0.4-0.3 % SOLN Place 1 drop into both eyes 3 (three) times daily as needed (dry/irritated eyes.).     vitamin B-12 (CYANOCOBALAMIN) 1000 MCG tablet Take 1,000 mcg by mouth daily.     No current facility-administered medications for this encounter.    Allergies  Allergen Reactions   Benazepril Cough   Adhesive [Tape] Rash and Other (See Comments)    bandaides   Sulfa Antibiotics Rash    Social History   Socioeconomic History   Marital status: Married    Spouse name: Not on file   Number of children: Not on file   Years of education: Not on file   Highest education level: Not on file  Occupational History   Not on file  Tobacco Use   Smoking status: Never   Smokeless tobacco: Never  Vaping Use   Vaping Use: Never used  Substance and Sexual Activity   Alcohol use: Yes    Alcohol/week: 1.0  standard drink of alcohol    Types: 1 Standard drinks or equivalent per week    Comment: 01/25/2018 "couple drinks/month; if that"   Drug use: Never   Sexual activity: Yes  Other Topics Concern   Not on file  Social History Narrative   Not on file   Social Determinants of Health   Financial Resource Strain: Not on file  Food Insecurity: Not on file  Transportation Needs: Not on file  Physical Activity: Not on file  Stress: Not on file  Social Connections: Not on file  Intimate Partner Violence: Not on file    Family History  Problem Relation Age of Onset   Heart disease Mother    Stomach cancer Paternal Grandfather    Colon cancer Neg Hx     ROS-  All systems are reviewed and negative except as per the HPI above  Physical Exam: Vitals:   02/26/22 1101  BP: (!) 142/76  Pulse: 94  Weight: 133.5 kg  Height: '6\' 1"'$  (1.854 m)   Wt Readings from Last 3 Encounters:  02/26/22 133.5 kg  01/06/22 132.9 kg  04/20/21 134.7 kg    Labs: Lab Results  Component Value Date   NA 139 05/14/2020   K 4.4 05/14/2020   CL 106 05/14/2020   CO2 24 05/14/2020   GLUCOSE 125 (H) 05/14/2020   BUN 12 05/14/2020   CREATININE 1.12 (H) 05/14/2020   CALCIUM 9.2 05/14/2020   Lab Results  Component Value Date   INR WILL FOLLOW 09/23/2016   INR 1.0 09/23/2016   No results found for: "CHOL", "HDL", "LDLCALC", "TRIG"   GEN- The patient is a well appearing obese female, alert and oriented x 3 today.   HEENT-head normocephalic, atraumatic, sclera clear, conjunctiva pink, hearing intact, trachea midline. Lungs- Clear to ausculation bilaterally, normal work of breathing Heart- irregular rate and rhythm, no murmurs, rubs or gallops  GI- soft, NT, ND, + BS Extremities- no clubbing, cyanosis, or edema MS- no significant deformity or atrophy Skin- no rash or lesion Psych- euthymic mood, full affect Neuro- strength and sensation are intact   EKG-  Afib, RBBB Vent. rate 94 BPM PR interval  * ms QRS duration 160 ms QT/QTcB 390/487 ms   Epic records reviewed  Assessment and Plan: 1. Persistent afib S/p ablation x 3, most recent 04/11/20 Patient back in symptomatic afib.  We discussed therapeutic options today including changing AAD to dofetilide. Unfortunately, she is on Lexapro and is very hesitant to change this medication as it has worked very well for her for years.  Will resume propafenone and arrange for DCCV. In the meantime, she will speak to her PCP about possibly changing to duloxetine which would not interact with dofetilide.  Check bmet/mag/cbc today. Continue metoprolol succinate 50 mg daily   2. Chadsvasc score of 2 Continue eliquis 5 mg BID  3. OSA Encouraged compliance with CPAP therapy.   Follow up in the AF clinic post DCCV.    [facility] [address] [place], [place] [address] [phone]

## 2022-02-26 NOTE — Patient Instructions (Addendum)
Resume propafenone '325mg'$  twice a day   Cardioversion scheduled for Friday, September 8th  - Arrive at the [facility] and go to admitting at 930am  - Do not eat or drink anything after midnight the night prior to your procedure.  - Take all your morning medication (except diabetic medications) with a sip of water prior to arrival.  - You will not be able to drive home after your procedure.  - Do NOT miss any doses of your blood thinner - if you should miss a dose please notify our office immediately.  - If you feel as if you go back into normal rhythm prior to scheduled cardioversion, please notify our office immediately. If your procedure is canceled in the cardioversion suite you will be charged a cancellation fee.

## 2022-02-26 NOTE — H&P (View-Only) (Signed)
Deborah [name]   Primary Care Physician: Gaynelle Arabian, MD Referring Physician: Dr. Gloris Ham Deborah [name] is a 66 y.o. female with a h/o persistent afib that is in the afib clinic for f/u on    ablation one month ago.  She has had 2 ablations in the past and several cardioversion's in the past. She remains on  Propafenone  325 mg bid.  She reports that she has not noted any afib since the procedure. She feels well. She is surprised to find out she is in afib at 84 bpm.  No swallowing or groin issues since the procedure. She  did have a fall yesterday, fell backward from a squatting position,  and is questioning if that could have thrown her out of rhythm.   F/u in afib clinic, 11/17. Ekg shows that pt continues with afib . She has noted taking her BP/HR issues since last visit,  that the pulse indicator light has been showing an irregular pulse so I feel that she is in persistent afib. I discussed a cardioversion and she is in agreement. No missed anticoagulation.   F/u successful cardioversion, 06/03/20. She continues in SR, she feels improved. No concerns voiced today.  Follow up in the AF clinic 09/01/20. Patient continued to have atrial fibrillation post repeat ablation and propafenone was resumed. Patient converted to SR with the medication. She is feeling well today. She denies any bleeding issues on anticoagulation.   Follow up in the AF clinic 02/26/22. She reports that she has been in afib for about two weeks with symptoms of fatigue and SOB with exertion. There were no specific triggers that she could identify. She called Dr Curt Bears office who recommended dofetilide admission.   Today, she denies symptoms of palpitations, chest pain, orthopnea, PND, lower extremity edema, dizziness, presyncope, syncope, or neurologic sequela. The patient is tolerating medications without difficulties and is otherwise without complaint today.   Past Medical History:  Diagnosis Date   A-fib ([place])    Anemia    hx     Anxiety    Arthritis    "was in right knee" (01/25/2018)   Dysrhythmia    atrial fib/flutter   GERD (gastroesophageal reflux disease)    occ   HTN (hypertension)    Migraine    "used to have them more frequently before menopause; now only have a couple/yr" (01/25/2018)   Obesity    OSA on CPAP    PAF (paroxysmal atrial fibrillation) ([place])    s/p DCCV 2010   Peripheral vascular disease ([place]) 2016   after flying in leg   Past Surgical History:  Procedure Laterality Date   ATRIAL FIBRILLATION ABLATION N/A 01/25/2018   Procedure: [place];  Surgeon: Constance Haw, MD;  Location: [place] CV LAB;  Service: Cardiovascular;  Laterality: N/A;   ATRIAL FIBRILLATION ABLATION N/A 04/11/2020   Procedure: ATRIAL FIBRILLATION ABLATION;  Surgeon: Constance Haw, MD;  Location: [place] CV LAB;  Service: Cardiovascular;  Laterality: N/A;   CARDIAC CATHETERIZATION  2005   CARDIOVERSION N/A 12/09/2017   Procedure: CARDIOVERSION;  Surgeon: Sanda Klein, MD;  Location: [place] ENDOSCOPY;  Service: Cardiovascular;  Laterality: N/A;   CARDIOVERSION N/A 05/09/2018   Procedure: CARDIOVERSION;  Surgeon: Josue Hector, MD;  Location: [facility] ENDOSCOPY;  Service: Cardiovascular;  Laterality: N/A;   CARDIOVERSION N/A 01/03/2019   Procedure: CARDIOVERSION;  Surgeon: Thayer Headings, MD;  Location: [facility] ENDOSCOPY;  Service: Cardiovascular;  Laterality: N/A;   CARDIOVERSION N/A 05/27/2020  Procedure: CARDIOVERSION;  Surgeon: Freada Bergeron, MD;  Location: [facility] ENDOSCOPY;  Service: Cardiovascular;  Laterality: N/A;   CATARACT EXTRACTION W/ INTRAOCULAR LENS IMPLANT Left 2016   ELECTROPHYSIOLOGIC STUDY N/A 12/04/2015   Procedure: Atrial Fibrillation Ablation;  Surgeon: Will Meredith Leeds, MD;  Location: [place] CV LAB;  Service: Cardiovascular;  Laterality: N/A;   JOINT REPLACEMENT     TONSILLECTOMY AND ADENOIDECTOMY     TOTAL KNEE ARTHROPLASTY Right 04/23/2016   Procedure:  TOTAL KNEE ARTHROPLASTY;  Surgeon: [name], MD;  Location: [place];  Service: Orthopedics;  Laterality: Right;   TUBAL LIGATION      Current Outpatient Medications  Medication Sig Dispense Refill   acetaminophen (TYLENOL) 500 MG tablet Take 1,000 mg by mouth every 6 (six) hours as needed for moderate pain or headache.     ALPRAZolam (XANAX) 0.5 MG tablet Take 0.5 mg by mouth as needed for anxiety.     apixaban (ELIQUIS) 5 MG TABS tablet TAKE 1 TABLET BY MOUTH TWICE A DAY 180 tablet 1   calcium carbonate (TUMS - DOSED IN MG ELEMENTAL CALCIUM) 500 MG chewable tablet Chew 3 tablets by mouth daily as needed for indigestion or heartburn.      Cholecalciferol (VITAMIN D3) 125 MCG (5000 UT) TABS Take 5,000 Units by mouth daily.      escitalopram (LEXAPRO) 10 MG tablet Take 10 mg by mouth every evening.   3   metoprolol succinate (TOPROL-XL) 50 MG 24 hr tablet TAKE 1 TABLET BY MOUTH EVERY DAY WITH OR IMMEDIATELY FOLLOWING A MEAL 90 tablet 2   Multiple Vitamins-Minerals (ICAPS AREDS FORMULA PO) Take 1 tablet by mouth in the morning and at bedtime.     Polyethyl Glycol-Propyl Glycol (SYSTANE) 0.4-0.3 % SOLN Place 1 drop into both eyes 3 (three) times daily as needed (dry/irritated eyes.).     vitamin B-12 (CYANOCOBALAMIN) 1000 MCG tablet Take 1,000 mcg by mouth daily.     No current facility-administered medications for this encounter.    Allergies  Allergen Reactions   Benazepril Cough   Adhesive [Tape] Rash and Other (See Comments)    bandaides   Sulfa Antibiotics Rash    Social History   Socioeconomic History   Marital status: Married    Spouse name: Not on file   Number of children: Not on file   Years of education: Not on file   Highest education level: Not on file  Occupational History   Not on file  Tobacco Use   Smoking status: Never   Smokeless tobacco: Never  Vaping Use   Vaping Use: Never used  Substance and Sexual Activity   Alcohol use: Yes    Alcohol/week: 1.0  standard drink of alcohol    Types: 1 Standard drinks or equivalent per week    Comment: 01/25/2018 "couple drinks/month; if that"   Drug use: Never   Sexual activity: Yes  Other Topics Concern   Not on file  Social History Narrative   Not on file   Social Determinants of Health   Financial Resource Strain: Not on file  Food Insecurity: Not on file  Transportation Needs: Not on file  Physical Activity: Not on file  Stress: Not on file  Social Connections: Not on file  Intimate Partner Violence: Not on file    Family History  Problem Relation Age of Onset   Heart disease Mother    Stomach cancer Paternal Grandfather    Colon cancer Neg Hx     ROS-  All systems are reviewed and negative except as per the HPI above  Physical Exam: Vitals:   02/26/22 1101  BP: (!) 142/76  Pulse: 94  Weight: 133.5 kg  Height: '6\' 1"'$  (1.854 m)   Wt Readings from Last 3 Encounters:  02/26/22 133.5 kg  01/06/22 132.9 kg  04/20/21 134.7 kg    Labs: Lab Results  Component Value Date   NA 139 05/14/2020   K 4.4 05/14/2020   CL 106 05/14/2020   CO2 24 05/14/2020   GLUCOSE 125 (H) 05/14/2020   BUN 12 05/14/2020   CREATININE 1.12 (H) 05/14/2020   CALCIUM 9.2 05/14/2020   Lab Results  Component Value Date   INR WILL FOLLOW 09/23/2016   INR 1.0 09/23/2016   No results found for: "CHOL", "HDL", "LDLCALC", "TRIG"   GEN- The patient is a well appearing obese female, alert and oriented x 3 today.   HEENT-head normocephalic, atraumatic, sclera clear, conjunctiva pink, hearing intact, trachea midline. Lungs- Clear to ausculation bilaterally, normal work of breathing Heart- irregular rate and rhythm, no murmurs, rubs or gallops  GI- soft, NT, ND, + BS Extremities- no clubbing, cyanosis, or edema MS- no significant deformity or atrophy Skin- no rash or lesion Psych- euthymic mood, full affect Neuro- strength and sensation are intact   EKG-  Afib, RBBB Vent. rate 94 BPM PR interval  * ms QRS duration 160 ms QT/QTcB 390/487 ms   Epic records reviewed  Assessment and Plan: 1. Persistent afib S/p ablation x 3, most recent 04/11/20 Patient back in symptomatic afib.  We discussed therapeutic options today including changing AAD to dofetilide. Unfortunately, she is on Lexapro and is very hesitant to change this medication as it has worked very well for her for years.  Will resume propafenone and arrange for DCCV. In the meantime, she will speak to her PCP about possibly changing to duloxetine which would not interact with dofetilide.  Check bmet/mag/cbc today. Continue metoprolol succinate 50 mg daily   2. Chadsvasc score of 2 Continue eliquis 5 mg BID  3. OSA Encouraged compliance with CPAP therapy.   Follow up in the AF clinic post DCCV.    [facility] [address] [place], [place] [address] [phone]

## 2022-03-02 ENCOUNTER — Encounter (HOSPITAL_COMMUNITY): Payer: Self-pay | Admitting: Cardiology

## 2022-03-05 ENCOUNTER — Encounter (HOSPITAL_COMMUNITY): Payer: Self-pay | Admitting: Cardiology

## 2022-03-05 ENCOUNTER — Ambulatory Visit (HOSPITAL_COMMUNITY)
Admission: RE | Admit: 2022-03-05 | Discharge: 2022-03-05 | Disposition: A | Discharge status: Home / Self Care | Payer: PPO | Attending: Cardiology | Admitting: Cardiology

## 2022-03-05 ENCOUNTER — Other Ambulatory Visit: Payer: Self-pay

## 2022-03-05 ENCOUNTER — Ambulatory Visit (HOSPITAL_BASED_OUTPATIENT_CLINIC_OR_DEPARTMENT_OTHER): Payer: PPO | Admitting: Anesthesiology

## 2022-03-05 ENCOUNTER — Ambulatory Visit (HOSPITAL_COMMUNITY): Payer: PPO | Admitting: Anesthesiology

## 2022-03-05 ENCOUNTER — Encounter (HOSPITAL_COMMUNITY)
Admission: RE | Disposition: A | Discharge status: Home / Self Care | Payer: Self-pay | Source: Home / Self Care | Attending: Cardiology

## 2022-03-05 DIAGNOSIS — I739 Peripheral vascular disease, unspecified: Secondary | ICD-10-CM | POA: Insufficient documentation

## 2022-03-05 DIAGNOSIS — G4733 Obstructive sleep apnea (adult) (pediatric): Secondary | ICD-10-CM | POA: Diagnosis not present

## 2022-03-05 DIAGNOSIS — I4892 Unspecified atrial flutter: Secondary | ICD-10-CM | POA: Diagnosis not present

## 2022-03-05 DIAGNOSIS — I4891 Unspecified atrial fibrillation: Secondary | ICD-10-CM | POA: Diagnosis not present

## 2022-03-05 DIAGNOSIS — I4819 Other persistent atrial fibrillation: Secondary | ICD-10-CM | POA: Diagnosis not present

## 2022-03-05 DIAGNOSIS — I10 Essential (primary) hypertension: Secondary | ICD-10-CM | POA: Insufficient documentation

## 2022-03-05 DIAGNOSIS — Z9989 Dependence on other enabling machines and devices: Secondary | ICD-10-CM | POA: Diagnosis not present

## 2022-03-05 DIAGNOSIS — I4510 Unspecified right bundle-branch block: Secondary | ICD-10-CM | POA: Insufficient documentation

## 2022-03-05 DIAGNOSIS — G4730 Sleep apnea, unspecified: Secondary | ICD-10-CM | POA: Insufficient documentation

## 2022-03-05 DIAGNOSIS — K219 Gastro-esophageal reflux disease without esophagitis: Secondary | ICD-10-CM | POA: Diagnosis not present

## 2022-03-05 DIAGNOSIS — F419 Anxiety disorder, unspecified: Secondary | ICD-10-CM | POA: Diagnosis not present

## 2022-03-05 DIAGNOSIS — M1990 Unspecified osteoarthritis, unspecified site: Secondary | ICD-10-CM | POA: Diagnosis not present

## 2022-03-05 DIAGNOSIS — Z79899 Other long term (current) drug therapy: Secondary | ICD-10-CM | POA: Insufficient documentation

## 2022-03-05 HISTORY — PX: CARDIOVERSION: SHX1299

## 2022-03-05 SURGERY — CARDIOVERSION
Anesthesia: General

## 2022-03-05 MED ORDER — SODIUM CHLORIDE 0.9 % IV SOLN: INTRAVENOUS | Status: DC

## 2022-03-05 MED ORDER — LIDOCAINE 2% (20 MG/ML) 5 ML SYRINGE
INTRAMUSCULAR | Status: DC | PRN
  Administered 2022-03-05: 100 mg via INTRAVENOUS

## 2022-03-05 MED ORDER — PROPOFOL 10 MG/ML IV BOLUS
INTRAVENOUS | Status: DC | PRN
  Administered 2022-03-05: 20 mg via INTRAVENOUS
  Administered 2022-03-05: 80 mg via INTRAVENOUS

## 2022-03-05 NOTE — Anesthesia Postprocedure Evaluation (Signed)
Anesthesia Post Note  Patient: Deborah [name]  Procedure(s) Performed: CARDIOVERSION     Patient location during evaluation: PACU Anesthesia Type: General Level of consciousness: sedated and patient cooperative Pain management: pain level controlled Vital Signs Assessment: post-procedure vital signs reviewed and stable Respiratory status: spontaneous breathing Cardiovascular status: stable Anesthetic complications: no   No notable events documented.  Last Vitals:  Vitals:   03/05/22 1050 03/05/22 1100  BP: 119/88 125/89  Pulse: 79 76  Resp: 16 19  Temp:  36.8 C  SpO2: 98% 98%    Last Pain:  Vitals:   03/05/22 1100  TempSrc: Temporal  PainSc: 0-No pain                 Nolon Nations

## 2022-03-05 NOTE — Discharge Instructions (Signed)

## 2022-03-05 NOTE — Transfer of Care (Signed)
Immediate Anesthesia Transfer of Care Note  Patient: Deborah [name]  Procedure(s) Performed: CARDIOVERSION  Patient Location: Endoscopy Unit  Anesthesia Type:General  Level of Consciousness: awake and drowsy  Airway & Oxygen Therapy: Patient Spontanous Breathing  Post-op Assessment: Report given to RN and Post -op Vital signs reviewed and stable  Post vital signs: Reviewed and stable  Last Vitals:  Vitals Value Taken Time  BP 120/93 (102)   Temp    Pulse 80   Resp 16   SpO2 97     Last Pain:  Vitals:   03/05/22 0912  TempSrc: Temporal  PainSc: 0-No pain         Complications: No notable events documented.

## 2022-03-05 NOTE — Anesthesia Procedure Notes (Signed)
Procedure Name: General with mask airway Date/Time: 03/05/2022 10:14 AM  Performed by: Dorann Lodge, CRNAPre-anesthesia Checklist: Patient identified, Emergency Drugs available, Suction available and Patient being monitored Patient Re-evaluated:Patient Re-evaluated prior to induction Oxygen Delivery Method: Ambu bag Preoxygenation: Pre-oxygenation with 100% oxygen Induction Type: IV induction Dental Injury: Teeth and Oropharynx as per pre-operative assessment

## 2022-03-05 NOTE — CV Procedure (Signed)
Procedure:   DCCV  Indication:  Symptomatic atrial fibrillation/atrial flutter  Procedure Note:  The patient signed informed consent.  They have had had therapeutic anticoagulation with apixaban greater than 3 weeks.  Anesthesia was administered by Dr. Lissa Hoard.  Patient received 100 mg IV lidocaine and 100 mg IV propofol. Adequate airway was maintained throughout and vital followed per protocol.  They were cardioverted x 1 with 150J of biphasic synchronized energy.  They converted to NSR intially, then began having frequent PACs. At the time of ECG, when she was awake, she appeared to be in sinus rhythm with frequent PACs. Ran a rhythm strip. Reviewed both ECG and rhythm strip with Dr. Lovena Le. His thoughts were that she had some sinus beats but appears to be in an atypical flutter now.  There were no apparent complications.  The patient had normal neuro status and respiratory status post procedure with vitals stable as recorded elsewhere.    Follow up:  They will continue on current medical therapy and follow up with cardiology as scheduled.  Buford Dresser, MD PhD 03/05/2022 10:28 AM

## 2022-03-05 NOTE — Anesthesia Preprocedure Evaluation (Addendum)
Anesthesia Evaluation  Patient identified by MRN, date of birth, ID band Patient awake    Reviewed: Allergy & Precautions, NPO status , Patient's Chart, lab work & pertinent test results, reviewed documented beta blocker date and time   History of Anesthesia Complications Negative for: history of anesthetic complications  Airway Mallampati: III  TM Distance: >3 FB Neck ROM: Full    Dental  (+) Dental Advisory Given, Teeth Intact   Pulmonary sleep apnea and Continuous Positive Airway Pressure Ventilation ,    Pulmonary exam normal breath sounds clear to auscultation       Cardiovascular hypertension, Pt. on home beta blockers and Pt. on medications + Peripheral Vascular Disease  + dysrhythmias (on Eliquis) Atrial Fibrillation  Rhythm:Irregular Rate:Normal  Echo 2017 - Left ventricle: Systolic function was normal. The estimated ejection fraction was in the range of 50% to 55%. The study is not technically sufficient to allow evaluation of LV diastolicfunction.  - Left atrium: The appendage was moderately dilated.  - Atrial septum: No defect or patent foramen ovale was identified.    Neuro/Psych  Headaches, Anxiety    GI/Hepatic Neg liver ROS, GERD  Medicated and Controlled,  Endo/Other  negative endocrine ROS  Renal/GU negative Renal ROS  negative genitourinary   Musculoskeletal  (+) Arthritis ,   Abdominal (+) + obese,   Peds  Hematology   Anesthesia Other Findings Day of surgery medications reviewed with patient.  Reproductive/Obstetrics negative OB ROS                            Anesthesia Physical Anesthesia Plan  ASA: 3  Anesthesia Plan: General   Post-op Pain Management: Minimal or no pain anticipated   Induction: Intravenous  PONV Risk Score and Plan: Treatment may vary due to age or medical condition and Propofol infusion  Airway Management Planned: Mask  Additional  Equipment: None  Intra-op Plan:   Post-operative Plan:   Informed Consent: I have reviewed the patients History and Physical, chart, labs and discussed the procedure including the risks, benefits and alternatives for the proposed anesthesia with the patient or authorized representative who has indicated his/her understanding and acceptance.     Dental advisory given  Plan Discussed with: CRNA  Anesthesia Plan Comments:        Anesthesia Quick Evaluation

## 2022-03-05 NOTE — Interval H&P Note (Signed)
History and Physical Interval Note:  03/05/2022 10:01 AM  Deborah [name]  has presented today for surgery, with the diagnosis of AFIB.  The various methods of treatment have been discussed with the patient and family. After consideration of risks, benefits and other options for treatment, the patient has consented to  Procedure(s): CARDIOVERSION (N/A) as a surgical intervention.  The patient's history has been reviewed, patient examined, no change in status, stable for surgery.  I have reviewed the patient's chart and labs.  Questions were answered to the patient's satisfaction.     Cortnee Steinmiller Harrell Gave

## 2022-03-07 ENCOUNTER — Encounter (HOSPITAL_COMMUNITY): Payer: Self-pay | Admitting: Cardiology

## 2022-03-15 ENCOUNTER — Ambulatory Visit (HOSPITAL_COMMUNITY)
Admission: RE | Admit: 2022-03-15 | Discharge: 2022-03-15 | Disposition: A | Discharge status: Home / Self Care | Payer: PPO | Source: Ambulatory Visit | Attending: Physician Assistant | Admitting: Physician Assistant

## 2022-03-15 ENCOUNTER — Encounter (HOSPITAL_COMMUNITY): Payer: Self-pay | Admitting: Physician Assistant

## 2022-03-15 VITALS — BP 130/74 | HR 57 | Ht 73.0 in | Wt 299.2 lb

## 2022-03-15 DIAGNOSIS — Z7901 Long term (current) use of anticoagulants: Secondary | ICD-10-CM | POA: Insufficient documentation

## 2022-03-15 DIAGNOSIS — I4510 Unspecified right bundle-branch block: Secondary | ICD-10-CM | POA: Insufficient documentation

## 2022-03-15 DIAGNOSIS — Z79899 Other long term (current) drug therapy: Secondary | ICD-10-CM | POA: Insufficient documentation

## 2022-03-15 DIAGNOSIS — G4733 Obstructive sleep apnea (adult) (pediatric): Secondary | ICD-10-CM | POA: Insufficient documentation

## 2022-03-15 DIAGNOSIS — R001 Bradycardia, unspecified: Secondary | ICD-10-CM | POA: Insufficient documentation

## 2022-03-15 DIAGNOSIS — I4819 Other persistent atrial fibrillation: Secondary | ICD-10-CM | POA: Insufficient documentation

## 2022-03-15 NOTE — Progress Notes (Signed)
Deborah [name]   Primary Care Physician: Gaynelle Arabian, MD Referring Physician: Dr. Gloris Ham Deborah [name] is a 67 y.o. female with a h/o persistent afib that is in the afib clinic for f/u on    ablation one month ago.  She has had 2 ablations in the past and several cardioversion's in the past. She remains on  Propafenone  325 mg bid.  She reports that she has not noted any afib since the procedure. She feels well. She is surprised to find out she is in afib at 84 bpm.  No swallowing or groin issues since the procedure. She  did have a fall yesterday, fell backward from a squatting position,  and is questioning if that could have thrown her out of rhythm.   F/u in afib clinic, 11/17. Ekg shows that pt continues with afib . She has noted taking her BP/HR issues since last visit,  that the pulse indicator light has been showing an irregular pulse so I feel that she is in persistent afib. I discussed a cardioversion and she is in agreement. No missed anticoagulation.   F/u successful cardioversion, 06/03/20. She continues in SR, she feels improved. No concerns voiced today.  Follow up in the AF clinic 09/01/20. Patient continued to have atrial fibrillation post repeat ablation and propafenone was resumed. Patient converted to SR with the medication. She is feeling well today. She denies any bleeding issues on anticoagulation.   Follow up in the AF clinic 02/26/22. She reports that she has been in afib for about two weeks with symptoms of fatigue and SOB with exertion. There were no specific triggers that she could identify. She called Dr Curt Bears office who recommended dofetilide admission.   Follow up in the AF clinic 03/15/22. Patient is s/p DCCV on 03/05/22. She feels well today and is in SR. No bleeding issues on anticoagulation.   Today, she denies symptoms of palpitations, chest pain, orthopnea, PND, lower extremity edema, dizziness, presyncope, syncope, or neurologic sequela. The patient is tolerating  medications without difficulties and is otherwise without complaint today.   Past Medical History:  Diagnosis Date   A-fib ([place])    Anemia    hx    Anxiety    Arthritis    "was in right knee" (01/25/2018)   Dysrhythmia    atrial fib/flutter   GERD (gastroesophageal reflux disease)    occ   HTN (hypertension)    Migraine    "used to have them more frequently before menopause; now only have a couple/yr" (01/25/2018)   Obesity    OSA on CPAP    PAF (paroxysmal atrial fibrillation) ([place])    s/p DCCV 2010   Peripheral vascular disease ([place]) 2016   after flying in leg   Past Surgical History:  Procedure Laterality Date   ATRIAL FIBRILLATION ABLATION N/A 01/25/2018   Procedure: [place];  Surgeon: Constance Haw, MD;  Location: [place] [facility];  Service: Cardiovascular;  Laterality: N/A;   ATRIAL FIBRILLATION ABLATION N/A 04/11/2020   Procedure: ATRIAL FIBRILLATION ABLATION;  Surgeon: Constance Haw, MD;  Location: [facility];  Service: Cardiovascular;  Laterality: N/A;   CARDIAC CATHETERIZATION  2005   CARDIOVERSION N/A 12/09/2017   Procedure: CARDIOVERSION;  Surgeon: Sanda Klein, MD;  Location: [facility];  Service: Cardiovascular;  Laterality: N/A;   CARDIOVERSION N/A 05/09/2018   Procedure: CARDIOVERSION;  Surgeon: Josue Hector, MD;  Location: [facility];  Service: Cardiovascular;  Laterality: N/A;  CARDIOVERSION N/A 01/03/2019   Procedure: CARDIOVERSION;  Surgeon: Acie Fredrickson, Wonda Cheng, MD;  Location: [facility] ENDOSCOPY;  Service: Cardiovascular;  Laterality: N/A;   CARDIOVERSION N/A 05/27/2020   Procedure: CARDIOVERSION;  Surgeon: Freada Bergeron, MD;  Location: [facility] ENDOSCOPY;  Service: Cardiovascular;  Laterality: N/A;   CARDIOVERSION N/A 03/05/2022   Procedure: CARDIOVERSION;  Surgeon: Buford Dresser, MD;  Location: [facility];  Service: Cardiovascular;  Laterality: N/A;   CATARACT EXTRACTION W/ INTRAOCULAR LENS IMPLANT Left  2016   ELECTROPHYSIOLOGIC STUDY N/A 12/04/2015   Procedure: Atrial Fibrillation Ablation;  Surgeon: Will Meredith Leeds, MD;  Location: [place] [facility];  Service: Cardiovascular;  Laterality: N/A;   JOINT REPLACEMENT     TONSILLECTOMY AND ADENOIDECTOMY     TOTAL KNEE ARTHROPLASTY Right 04/23/2016   Procedure: TOTAL KNEE ARTHROPLASTY;  Surgeon: [name], MD;  Location: [facility];  Service: Orthopedics;  Laterality: Right;   TUBAL LIGATION      Current Outpatient Medications  Medication Sig Dispense Refill   acetaminophen (TYLENOL) 500 MG tablet Take 1,000 mg by mouth every 6 (six) hours as needed for moderate pain or headache.     ALPRAZolam (XANAX) 0.5 MG tablet Take 0.25-1 mg by mouth daily as needed for anxiety.     apixaban (ELIQUIS) 5 MG TABS tablet TAKE 1 TABLET BY MOUTH TWICE A DAY 180 tablet 1   calcium carbonate (TUMS - DOSED IN MG ELEMENTAL CALCIUM) 500 MG chewable tablet Chew 2 tablets by mouth daily as needed for indigestion or heartburn.     Cholecalciferol (VITAMIN D3) 125 MCG (5000 UT) TABS Take 5,000 Units by mouth daily.      escitalopram (LEXAPRO) 10 MG tablet Take 10 mg by mouth every evening.   3   metoprolol succinate (TOPROL-XL) 50 MG 24 hr tablet TAKE 1 TABLET BY MOUTH EVERY DAY WITH OR IMMEDIATELY FOLLOWING A MEAL 90 tablet 2   Multiple Vitamins-Minerals (ICAPS AREDS FORMULA PO) Take 1 tablet by mouth in the morning and at bedtime.     Polyethyl Glycol-Propyl Glycol (SYSTANE) 0.4-0.3 % SOLN Place 1 drop into both eyes 3 (three) times daily as needed (dry/irritated eyes.).     propafenone (RYTHMOL SR) 325 MG 12 hr capsule Take 1 capsule (325 mg total) by mouth 2 (two) times daily. 180 capsule 2   vitamin B-12 (CYANOCOBALAMIN) 1000 MCG tablet Take 1,000 mcg by mouth daily.     No current facility-administered medications for this encounter.    Allergies  Allergen Reactions   Benazepril Cough   Adhesive [Tape] Rash and Other (See Comments)    bandaides   Sulfa  Antibiotics Rash    Social History   Socioeconomic History   Marital status: Married    Spouse name: Not on file   Number of children: Not on file   Years of education: Not on file   Highest education level: Not on file  Occupational History   Not on file  Tobacco Use   Smoking status: Never   Smokeless tobacco: Never   Tobacco comments:    Never smoke 03/15/22  Vaping Use   Vaping Use: Never used  Substance and Sexual Activity   Alcohol use: Yes    Alcohol/week: 1.0 standard drink of alcohol    Types: 1 Standard drinks or equivalent per week    Comment: 01/25/2018 "couple drinks/month; if that"   Drug use: Never   Sexual activity: Yes  Other Topics Concern   Not on file  Social History Narrative  Not on file   Social Determinants of Health   Financial Resource Strain: Not on file  Food Insecurity: Not on file  Transportation Needs: Not on file  Physical Activity: Not on file  Stress: Not on file  Social Connections: Not on file  Intimate Partner Violence: Not on file    Family History  Problem Relation Age of Onset   Heart disease Mother    Stomach cancer Paternal Grandfather    Colon cancer Neg Hx     ROS- All systems are reviewed and negative except as per the HPI above  Physical Exam: Vitals:   03/15/22 1101  BP: 130/74  Pulse: (!) 57  Weight: 135.7 kg  Height: '6\' 1"'$  (1.854 m)   Wt Readings from Last 3 Encounters:  03/15/22 135.7 kg  03/05/22 131.5 kg  02/26/22 133.5 kg    Labs: Lab Results  Component Value Date   NA 141 02/26/2022   K 4.3 02/26/2022   CL 107 02/26/2022   CO2 29 02/26/2022   GLUCOSE 86 02/26/2022   BUN 10 02/26/2022   CREATININE 0.81 02/26/2022   CALCIUM 9.4 02/26/2022   MG 2.1 02/26/2022   Lab Results  Component Value Date   INR WILL FOLLOW 09/23/2016   INR 1.0 09/23/2016   No results found for: "CHOL", "HDL", "LDLCALC", "TRIG"   GEN- The patient is a well appearing obese female, alert and oriented x 3 today.    HEENT-head normocephalic, atraumatic, sclera clear, conjunctiva pink, hearing intact, trachea midline. Lungs- Clear to ausculation bilaterally, normal work of breathing Heart- Regular rate and rhythm, no murmurs, rubs or gallops  GI- soft, NT, ND, + BS Extremities- no clubbing, cyanosis, or edema MS- no significant deformity or atrophy Skin- no rash or lesion Psych- euthymic mood, full affect Neuro- strength and sensation are intact   EKG-  SB, RBBB Vent. rate 57 BPM PR interval 200 ms QRS duration 184 ms QT/QTcB 490/476 ms   Epic records reviewed  Assessment and Plan: 1. Persistent afib S/p ablation x 3, most recent 04/11/20 S/p DCCV 03/05/22 Patient in [place] today.  Continue propafenone 325 mg BID. QRS wide but stable compared to previous ECGs.  Patient has an appointment with her PCP and plans to discuss possibly changing to Lexapro to duloxetine which would not interact with dofetilide if we need to change AAD.   Continue metoprolol succinate 50 mg daily   2. Chadsvasc score of 2 Continue eliquis 5 mg BID  3. OSA Encouraged compliance with CPAP therapy.   Follow up in the AF clinic in 6 months.    [facility] [address] [place], [place] [address] [phone]

## 2022-03-17 ENCOUNTER — Other Ambulatory Visit: Payer: Self-pay | Admitting: Cardiology

## 2022-03-17 ENCOUNTER — Encounter: Payer: Self-pay | Admitting: Cardiology

## 2022-03-17 DIAGNOSIS — I480 Paroxysmal atrial fibrillation: Secondary | ICD-10-CM

## 2022-03-17 NOTE — Telephone Encounter (Signed)
Prescription refill request for Eliquis received. Indication:Afib  Last office visit:  03/15/22 Marlene Lard)  Scr: 0.81 (02/26/22) Age: 66 Weight: 135.7kg  Appropriate dose and refill sent to requested pharmacy.

## 2022-03-18 DIAGNOSIS — Z0000 Encounter for general adult medical examination without abnormal findings: Secondary | ICD-10-CM | POA: Diagnosis not present

## 2022-03-18 DIAGNOSIS — I480 Paroxysmal atrial fibrillation: Secondary | ICD-10-CM | POA: Diagnosis not present

## 2022-03-18 DIAGNOSIS — G629 Polyneuropathy, unspecified: Secondary | ICD-10-CM | POA: Diagnosis not present

## 2022-03-18 DIAGNOSIS — I10 Essential (primary) hypertension: Secondary | ICD-10-CM | POA: Diagnosis not present

## 2022-03-18 DIAGNOSIS — F411 Generalized anxiety disorder: Secondary | ICD-10-CM | POA: Diagnosis not present

## 2022-03-18 DIAGNOSIS — E538 Deficiency of other specified B group vitamins: Secondary | ICD-10-CM | POA: Diagnosis not present

## 2022-03-18 DIAGNOSIS — Z1331 Encounter for screening for depression: Secondary | ICD-10-CM | POA: Diagnosis not present

## 2022-03-18 DIAGNOSIS — R7309 Other abnormal glucose: Secondary | ICD-10-CM | POA: Diagnosis not present

## 2022-03-18 DIAGNOSIS — G4733 Obstructive sleep apnea (adult) (pediatric): Secondary | ICD-10-CM | POA: Diagnosis not present

## 2022-03-18 DIAGNOSIS — D6869 Other thrombophilia: Secondary | ICD-10-CM | POA: Diagnosis not present

## 2022-03-18 DIAGNOSIS — N183 Chronic kidney disease, stage 3 unspecified: Secondary | ICD-10-CM | POA: Diagnosis not present

## 2022-03-23 ENCOUNTER — Encounter: Payer: Self-pay | Admitting: Cardiology

## 2022-03-24 NOTE — Telephone Encounter (Signed)
Called pt to discuss. Pt has decided not to do this medication and stick with what she knows is safe. If she would like to readdress this at a later date she will let us know. She appreciates my call.

## 2022-03-31 ENCOUNTER — Other Ambulatory Visit: Payer: Self-pay | Admitting: Cardiology

## 2022-06-14 IMAGING — CT CT HEART MORPH/PULM VEIN W/ CM & W/O CA SCORE
2 of 7 series · 11 of 20 positions shown, 13 images · IV contrast (Omni 300)
Comparison: None.
COMPARISON: None.

Addendum:
EXAM:
OVER-READ INTERPRETATION  CT CHEST

The following report is an over-read performed by radiologist Dr.
Antonija Ivica Telebar [REDACTED] on 04/07/2020. This
over-read does not include interpretation of cardiac or coronary
anatomy or pathology. The coronary calcium score/coronary CTA
interpretation by the cardiologist is attached.
CLINICAL DATA: 64F with atrial fibrillation scheduled for an
ablation.
Cardiac CT/CTA
TECHNIQUE: The patient was scanned on a Siemens Somatom scanner.

[Series 9: 0-95% · axial · 0.39mm/px · z∈[+1289,+1365]mm · 5 of 2290 slices shown]
[im 382/2290  vessel]
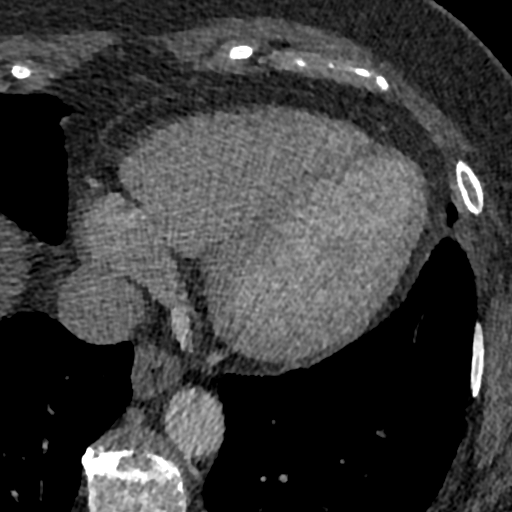
[im 764/2290  vessel]
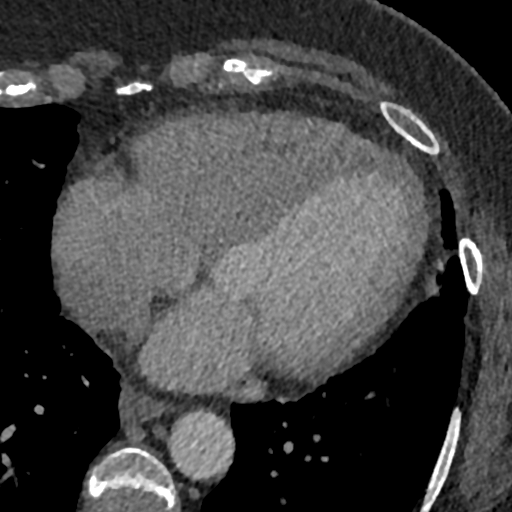
[im 1145/2290  vessel]
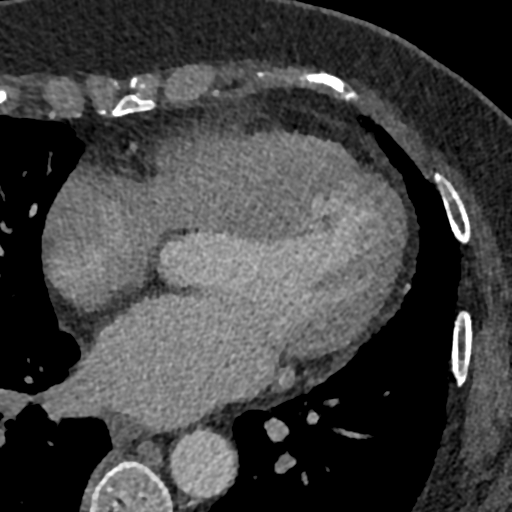
[im 1527/2290  vessel]
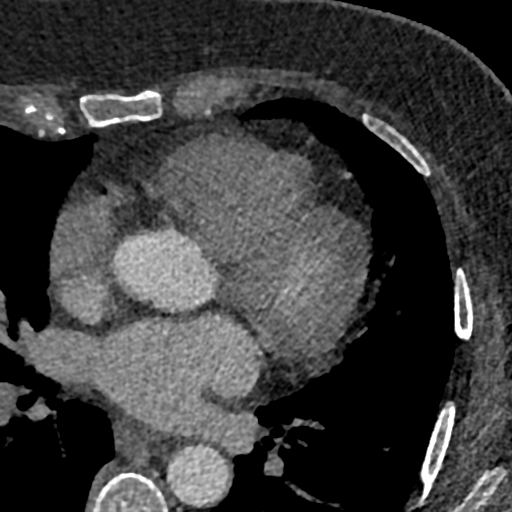
[im 1908/2290  vessel]
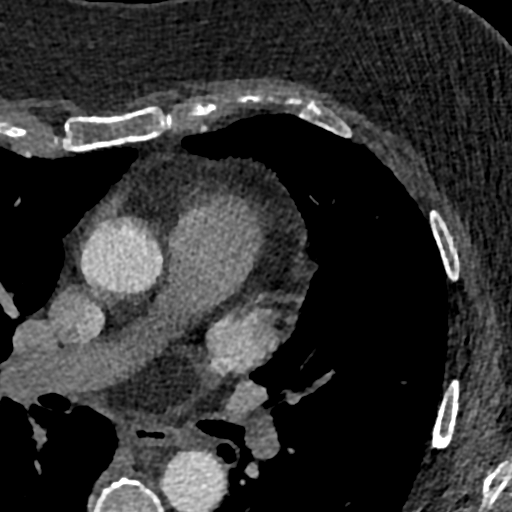

[Series 12: 5-95% · axial · 0.39mm/px · z∈[+1286,+1368]mm · 6 of 2290 slices shown, 8 images]
[im 328/2290  vessel]
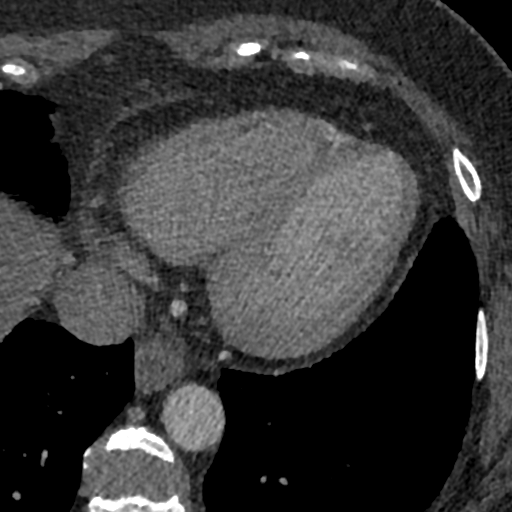
[im 328/2290  lung]
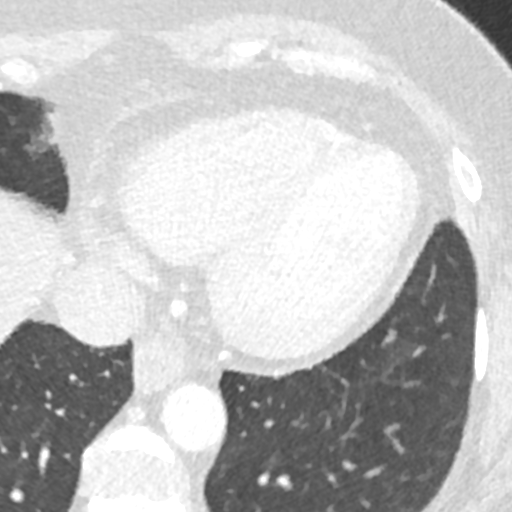
[im 655/2290  vessel]
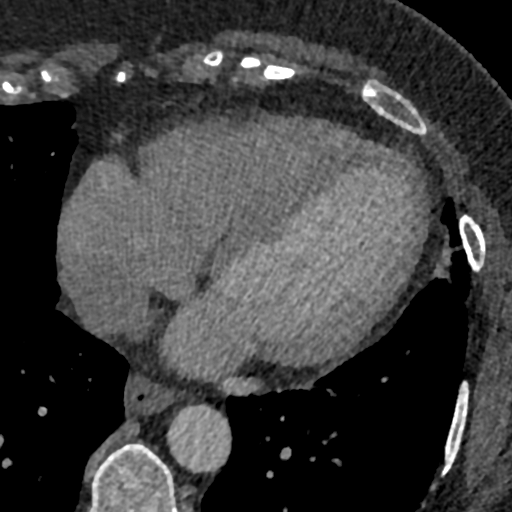
[im 982/2290  vessel]
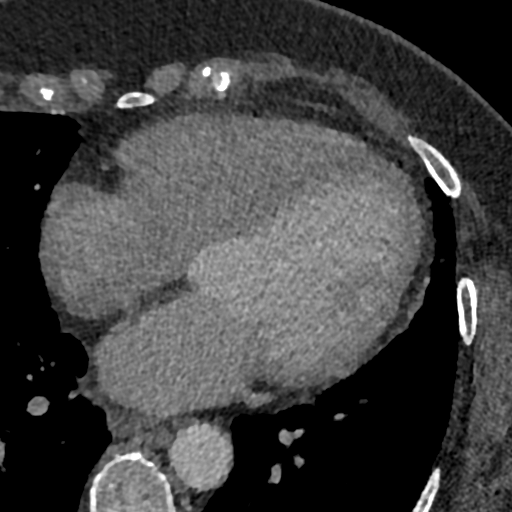
[im 1309/2290  vessel]
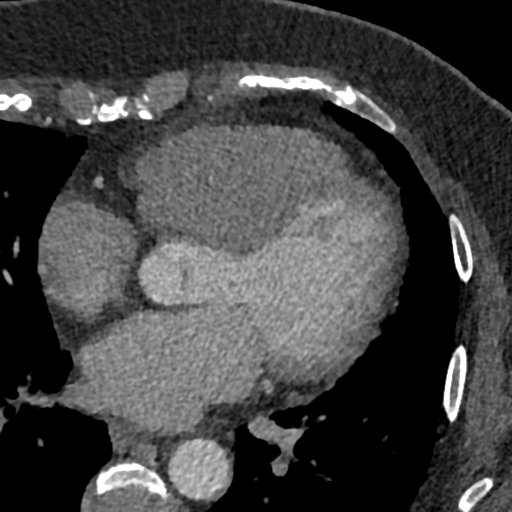
[im 1636/2290  vessel]
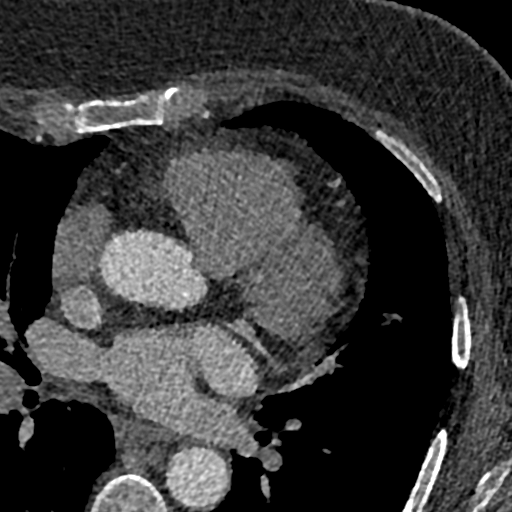
[im 1636/2290  lung]
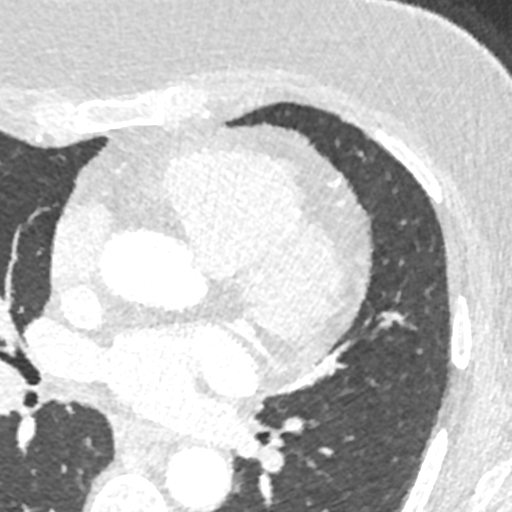
[im 1963/2290  vessel]
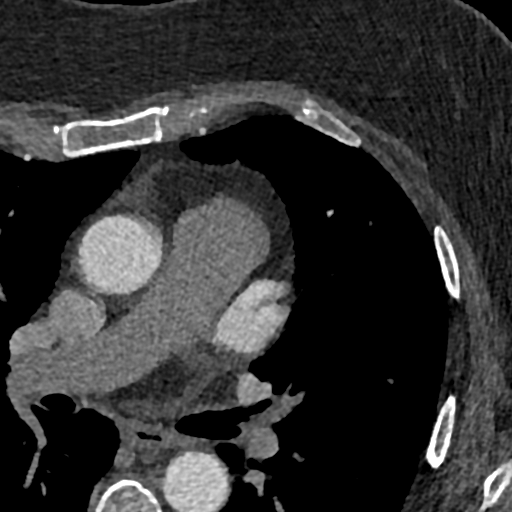

[11 of 20 positions shown; findings below may reference images not displayed]

FINDINGS: Within the visualized portions of the thorax there are no suspicious
appearing pulmonary nodules or masses, there is no acute
consolidative airspace disease, no pleural effusions, no
pneumothorax and no lymphadenopathy. Visualized portions of the
upper abdomen are unremarkable. There are no aggressive appearing
lytic or blastic lesions noted in the visualized portions of the
skeleton.
IMPRESSION: No significant incidental noncardiac findings are noted.
FINDINGS: A 120 kV prospective scan was triggered in the descending thoracic
aorta at 111 HU's. Gantry rotation speed was 280 msecs and
collimation was .9 mm. No beta blockade and no NTG was given. The 3D
data set was reconstructed in 5% intervals of the 60-80 % of the R-R
cycle. Diastolic phases were analyzed on a dedicated work station
using MPR, MIP and VRT modes. The patient received 80 cc of
contrast.

There is normal pulmonary vein drainage into the left atrium (2 on
the right and 2 on the left) with ostial measurements as follows:

RUPV: 19.2 x 17.8 mm

RLPV: 19.4 x 17.0 mm

LUPV: 17.6 x 14.0 mm

LLPV: 17.1 x 13.3 mm

The left atrial appendage is large windsock type with two lobes and
ostial size 28.0 x 21.0 mm and length 36 mm. There is no thrombus in
the left atrial appendage.

The esophagus runs in the right of the left atrial appendage
midline, closest to the right lower pulmonary vein.

Aorta:  Normal caliber.  No dissection or calcifications.

Aortic Valve:  Trileaflet.  No calcifications.

Coronary Arteries: Normal coronary origin. Right dominance. The
study was performed without use of NTG and insufficient for plaque
evaluation. Coronary calcium score 0.
IMPRESSION: 1. There is normal pulmonary vein drainage into the left atrium.

2. The left atrial appendage is large windsock type with two lobes
and ostial size 28.0 x 21.0 mm and length 36 mm. There is no
thrombus in the left atrial appendage.

3. The esophagus runs in the right of the left atrial appendage
midline, closest to the right lower pulmonary vein.

4. Coronary calcium score 0.

*** End of Addendum ***
EXAM:
OVER-READ INTERPRETATION  CT CHEST

The following report is an over-read performed by radiologist Dr.
Antonija Ivica Telebar [REDACTED] on 04/07/2020. This
over-read does not include interpretation of cardiac or coronary
anatomy or pathology. The coronary calcium score/coronary CTA
interpretation by the cardiologist is attached.
FINDINGS: Within the visualized portions of the thorax there are no suspicious
appearing pulmonary nodules or masses, there is no acute
consolidative airspace disease, no pleural effusions, no
pneumothorax and no lymphadenopathy. Visualized portions of the
upper abdomen are unremarkable. There are no aggressive appearing
lytic or blastic lesions noted in the visualized portions of the
skeleton.
IMPRESSION: No significant incidental noncardiac findings are noted.

## 2022-07-22 DIAGNOSIS — G4733 Obstructive sleep apnea (adult) (pediatric): Secondary | ICD-10-CM | POA: Diagnosis not present

## 2022-08-05 ENCOUNTER — Encounter (HOSPITAL_COMMUNITY): Payer: Self-pay | Admitting: *Deleted

## 2022-09-06 ENCOUNTER — Other Ambulatory Visit (HOSPITAL_COMMUNITY): Payer: Self-pay | Admitting: Physician Assistant

## 2022-09-07 DIAGNOSIS — K625 Hemorrhage of anus and rectum: Secondary | ICD-10-CM | POA: Diagnosis not present

## 2022-09-07 DIAGNOSIS — D6869 Other thrombophilia: Secondary | ICD-10-CM | POA: Diagnosis not present

## 2022-09-07 DIAGNOSIS — I480 Paroxysmal atrial fibrillation: Secondary | ICD-10-CM | POA: Diagnosis not present

## 2022-09-07 DIAGNOSIS — K648 Other hemorrhoids: Secondary | ICD-10-CM | POA: Diagnosis not present

## 2022-09-15 ENCOUNTER — Encounter (HOSPITAL_COMMUNITY): Payer: Self-pay | Admitting: Physician Assistant

## 2022-09-15 ENCOUNTER — Ambulatory Visit (HOSPITAL_COMMUNITY)
Admission: RE | Admit: 2022-09-15 | Discharge: 2022-09-15 | Disposition: A | Discharge status: Home / Self Care | Payer: PPO | Source: Ambulatory Visit | Attending: Physician Assistant | Admitting: Physician Assistant

## 2022-09-15 VITALS — BP 124/84 | HR 70 | Ht 73.0 in | Wt 276.2 lb

## 2022-09-15 DIAGNOSIS — Z7901 Long term (current) use of anticoagulants: Secondary | ICD-10-CM | POA: Insufficient documentation

## 2022-09-15 DIAGNOSIS — I4819 Other persistent atrial fibrillation: Secondary | ICD-10-CM | POA: Diagnosis not present

## 2022-09-15 DIAGNOSIS — G4733 Obstructive sleep apnea (adult) (pediatric): Secondary | ICD-10-CM | POA: Diagnosis not present

## 2022-09-15 NOTE — Progress Notes (Addendum)
Primary Care Physician: Gaynelle Arabian, MD Referring Physician: Dr. Gloris Ham DOMINQUE Deborah [name] is a 67 y.o. female with a h/o persistent afib that is in the afib clinic for f/u on    ablation one month ago.  She has had 2 ablations in the past and several cardioversion's in the past. She remains on  Propafenone  325 mg bid.  She reports that she has not noted any afib since the procedure. She feels well. She is surprised to find out she is in afib at 84 bpm.  No swallowing or groin issues since the procedure. She  did have a fall yesterday, fell backward from a squatting position,  and is questioning if that could have thrown her out of rhythm.   F/u in afib clinic, 11/17. Ekg shows that pt continues with afib . She has noted taking her BP/HR issues since last visit,  that the pulse indicator light has been showing an irregular pulse so I feel that she is in persistent afib. I discussed a cardioversion and she is in agreement. No missed anticoagulation.   F/u successful cardioversion, 06/03/20. She continues in SR, she feels improved. No concerns voiced today.  Follow up in the AF clinic 09/01/20. Patient continued to have atrial fibrillation post repeat ablation and propafenone was resumed. Patient converted to SR with the medication. She is feeling well today. She denies any bleeding issues on anticoagulation.   Follow up in the AF clinic 02/26/22. She reports that she has been in afib for about two weeks with symptoms of fatigue and SOB with exertion. There were no specific triggers that she could identify. She called Dr Curt Bears office who recommended dofetilide admission.   Follow up in the AF clinic 03/15/22. Patient is s/p DCCV on 03/05/22. She feels well today and is in SR. No bleeding issues on anticoagulation.   Follow up in the AF clinic 09/15/22. Patient reports that she has done well since her last visit and has not had any afib episodes. No bleeding issues on anticoagulation.   Today, she  denies symptoms of palpitations, chest pain, orthopnea, PND, lower extremity edema, dizziness, presyncope, syncope, or neurologic sequela. The patient is tolerating medications without difficulties and is otherwise without complaint today.   Past Medical History:  Diagnosis Date   A-fib ([place])    Anemia    hx    Anxiety    Arthritis    "was in right knee" (01/25/2018)   Dysrhythmia    atrial fib/flutter   GERD (gastroesophageal reflux disease)    occ   HTN (hypertension)    Migraine    "used to have them more frequently before menopause; now only have a couple/yr" (01/25/2018)   Obesity    OSA on CPAP    PAF (paroxysmal atrial fibrillation) ([place])    s/p DCCV 2010   Peripheral vascular disease ([place]) 2016   after flying in leg   Past Surgical History:  Procedure Laterality Date   ATRIAL FIBRILLATION ABLATION N/A 01/25/2018   Procedure: [place];  Surgeon: Constance Haw, MD;  Location: [facility];  Service: Cardiovascular;  Laterality: N/A;   ATRIAL FIBRILLATION ABLATION N/A 04/11/2020   Procedure: ATRIAL FIBRILLATION ABLATION;  Surgeon: Constance Haw, MD;  Location: [facility];  Service: Cardiovascular;  Laterality: N/A;   CARDIAC CATHETERIZATION  2005   CARDIOVERSION N/A 12/09/2017   Procedure: CARDIOVERSION;  Surgeon: Sanda Klein, MD;  Location: [facility];  Service:  Cardiovascular;  Laterality: N/A;   CARDIOVERSION N/A 05/09/2018   Procedure: CARDIOVERSION;  Surgeon: Josue Hector, MD;  Location: [facility];  Service: Cardiovascular;  Laterality: N/A;   CARDIOVERSION N/A 01/03/2019   Procedure: CARDIOVERSION;  Surgeon: Thayer Headings, MD;  Location: [facility] ENDOSCOPY;  Service: Cardiovascular;  Laterality: N/A;   CARDIOVERSION N/A 05/27/2020   Procedure: CARDIOVERSION;  Surgeon: Freada Bergeron, MD;  Location: [facility] ENDOSCOPY;  Service: Cardiovascular;  Laterality: N/A;   CARDIOVERSION N/A 03/05/2022   Procedure:  CARDIOVERSION;  Surgeon: Buford Dresser, MD;  Location: [facility];  Service: Cardiovascular;  Laterality: N/A;   CATARACT EXTRACTION W/ INTRAOCULAR LENS IMPLANT Left 2016   ELECTROPHYSIOLOGIC STUDY N/A 12/04/2015   Procedure: Atrial Fibrillation Ablation;  Surgeon: Will Meredith Leeds, MD;  Location: [place] CV LAB;  Service: Cardiovascular;  Laterality: N/A;   JOINT REPLACEMENT     TONSILLECTOMY AND ADENOIDECTOMY     TOTAL KNEE ARTHROPLASTY Right 04/23/2016   Procedure: TOTAL KNEE ARTHROPLASTY;  Surgeon: [name], MD;  Location: [facility];  Service: Orthopedics;  Laterality: Right;   TUBAL LIGATION      Current Outpatient Medications  Medication Sig Dispense Refill   acetaminophen (TYLENOL) 500 MG tablet Take 1,000 mg by mouth every 6 (six) hours as needed for moderate pain or headache.     ALPRAZolam (XANAX) 0.5 MG tablet Take 0.25-1 mg by mouth daily as needed for anxiety.     apixaban (ELIQUIS) 5 MG TABS tablet TAKE 1 TABLET BY MOUTH TWICE A DAY 60 tablet 11   calcium carbonate (TUMS - DOSED IN MG ELEMENTAL CALCIUM) 500 MG chewable tablet Chew 2 tablets by mouth daily as needed for indigestion or heartburn.     Cholecalciferol (VITAMIN D3) 125 MCG (5000 UT) TABS Take 5,000 Units by mouth daily.      escitalopram (LEXAPRO) 10 MG tablet Take 10 mg by mouth every evening.   3   metoprolol succinate (TOPROL-XL) 50 MG 24 hr tablet TAKE 1 TABLET BY MOUTH EVERY DAY WITH OR IMMEDIATELY FOLLOWING A MEAL 90 tablet 2   Multiple Vitamins-Minerals (ICAPS AREDS FORMULA PO) Take 1 tablet by mouth in the morning and at bedtime.     Polyethyl Glycol-Propyl Glycol (SYSTANE) 0.4-0.3 % SOLN Place 1 drop into both eyes 3 (three) times daily as needed (dry/irritated eyes.).     propafenone (RYTHMOL SR) 325 MG 12 hr capsule TAKE 1 CAPSULE (325 MG TOTAL) BY MOUTH 2 (TWO) TIMES DAILY. 180 capsule 2   vitamin B-12 (CYANOCOBALAMIN) 1000 MCG tablet Take 1,000 mcg by mouth daily.     No current  facility-administered medications for this encounter.    Allergies  Allergen Reactions   Benazepril Cough   Adhesive [Tape] Rash and Other (See Comments)    bandaides   Sulfa Antibiotics Rash    Social History   Socioeconomic History   Marital status: Married    Spouse name: Not on file   Number of children: Not on file   Years of education: Not on file   Highest education level: Not on file  Occupational History   Not on file  Tobacco Use   Smoking status: Never   Smokeless tobacco: Never   Tobacco comments:    Never smoke 03/15/22  Vaping Use   Vaping Use: Never used  Substance and Sexual Activity   Alcohol use: Yes    Alcohol/week: 1.0 standard drink of alcohol    Types: 1 Standard drinks or equivalent per week  Comment: 01/25/2018 "couple drinks/month; if that"   Drug use: Never   Sexual activity: Yes  Other Topics Concern   Not on file  Social History Narrative   Not on file   Social Determinants of Health   Financial Resource Strain: Not on file  Food Insecurity: Not on file  Transportation Needs: Not on file  Physical Activity: Not on file  Stress: Not on file  Social Connections: Not on file  Intimate Partner Violence: Not on file    Family History  Problem Relation Age of Onset   Heart disease Mother    Stomach cancer Paternal Grandfather    Colon cancer Neg Hx     ROS- All systems are reviewed and negative except as per the HPI above  Physical Exam: Vitals:   09/15/22 1106  BP: 124/84  Pulse: 70  Weight: 125.3 kg  Height: 6\' 1"  (1.854 m)    Wt Readings from Last 3 Encounters:  09/15/22 125.3 kg  03/15/22 135.7 kg  03/05/22 131.5 kg    Labs: Lab Results  Component Value Date   NA 141 02/26/2022   K 4.3 02/26/2022   CL 107 02/26/2022   CO2 29 02/26/2022   GLUCOSE 86 02/26/2022   BUN 10 02/26/2022   CREATININE 0.81 02/26/2022   CALCIUM 9.4 02/26/2022   MG 2.1 02/26/2022   Lab Results  Component Value Date   INR WILL  FOLLOW 09/23/2016   INR 1.0 09/23/2016   No results found for: "CHOL", "HDL", "LDLCALC", "TRIG"   GEN- The patient is a well appearing female, alert and oriented x 3 today.   HEENT-head normocephalic, atraumatic, sclera clear, conjunctiva pink, hearing intact, trachea midline. Lungs- Clear to ausculation bilaterally, normal work of breathing Heart- Regular rate and rhythm, no murmurs, rubs or gallops  GI- soft, NT, ND, + BS Extremities- no clubbing, cyanosis, or edema MS- no significant deformity or atrophy Skin- no rash or lesion Psych- euthymic mood, full affect Neuro- strength and sensation are intact   EKG today demonstrates SR, RBBB Vent. rate 70 BPM PR interval 176 ms QRS duration 172 ms QT/QTcB 450/486 ms   Epic records reviewed   CHA2DS2-VASc Score = 2  The patient's score is based upon: CHF History: 0 HTN History: 0 Diabetes History: 0 Stroke History: 0 Vascular Disease History: 0 Age Score: 1 Gender Score: 1       ASSESSMENT AND PLAN: 1. Persistent Atrial Fibrillation (ICD10:  I48.19) The patient's CHA2DS2-VASc score is 2, indicating a 2.2% annual risk of stroke.   S/p ablation x 3, most recent 04/11/20 Patient appears to be maintaining SR.  Continue propafenone 325 mg BID. Intervals stable. If dofetilide is needed for rhythm control, she will have to be changed from Lexapro to a different antidepressant.  Continue metoprolol succinate 50 mg daily  Continue Eliquis 5 mg BID  2. OSA Encouraged compliance with CPAP therapy.   Follow up with Dr Curt Bears as scheduled. AF clinic in 6 months.    [facility] [address] [place], [place] [address] [phone]

## 2022-09-24 DIAGNOSIS — H353112 Nonexudative age-related macular degeneration, right eye, intermediate dry stage: Secondary | ICD-10-CM | POA: Diagnosis not present

## 2022-09-24 DIAGNOSIS — Z961 Presence of intraocular lens: Secondary | ICD-10-CM | POA: Diagnosis not present

## 2022-09-24 DIAGNOSIS — H2511 Age-related nuclear cataract, right eye: Secondary | ICD-10-CM | POA: Diagnosis not present

## 2022-09-24 DIAGNOSIS — H3581 Retinal edema: Secondary | ICD-10-CM | POA: Diagnosis not present

## 2022-09-24 DIAGNOSIS — H26492 Other secondary cataract, left eye: Secondary | ICD-10-CM | POA: Diagnosis not present

## 2022-09-24 DIAGNOSIS — H35052 Retinal neovascularization, unspecified, left eye: Secondary | ICD-10-CM | POA: Diagnosis not present

## 2022-09-24 DIAGNOSIS — H318 Other specified disorders of choroid: Secondary | ICD-10-CM | POA: Diagnosis not present

## 2022-09-29 ENCOUNTER — Ambulatory Visit: Payer: PPO | Attending: Cardiology | Admitting: Cardiology

## 2022-09-29 ENCOUNTER — Encounter: Payer: Self-pay | Admitting: Cardiology

## 2022-09-29 VITALS — BP 124/78 | HR 63 | Ht 73.0 in | Wt 279.4 lb

## 2022-09-29 DIAGNOSIS — D6869 Other thrombophilia: Secondary | ICD-10-CM | POA: Diagnosis not present

## 2022-09-29 DIAGNOSIS — G4733 Obstructive sleep apnea (adult) (pediatric): Secondary | ICD-10-CM | POA: Diagnosis not present

## 2022-09-29 DIAGNOSIS — I4819 Other persistent atrial fibrillation: Secondary | ICD-10-CM

## 2022-09-29 DIAGNOSIS — I10 Essential (primary) hypertension: Secondary | ICD-10-CM

## 2022-09-29 NOTE — Progress Notes (Signed)
Electrophysiology Office Note   Date:  09/29/2022   ID:  Deborah [name], DOB [birth year], MRN [id number]  PCP:  Gaynelle Arabian, MD  Cardiologist:  Irish Lack Primary Electrophysiologist:  Constance Haw, MD    No chief complaint on file.     History of Present Illness: Deborah [name] is a 67 y.o. female who presents today for electrophysiology evaluation.     She has a history significant for atrial fibrillation.  She is post ablation x 3, most recently 04/11/2020.  She is continued to have episodes of atrial fibrillation and now is on propafenone.  She has a right bundle branch block and thus propafenone dose cannot be increased.  Today, denies symptoms of palpitations, chest pain, shortness of breath, orthopnea, PND, lower extremity edema, claudication, dizziness, presyncope, syncope, bleeding, or neurologic sequela. The patient is tolerating medications without difficulties.  Since being seen she has done well.  She has had no further episodes of atrial fibrillation.  She is quite happy with her control.  Past Medical History:  Diagnosis Date   A-fib    Anemia    hx    Anxiety    Arthritis    "was in right knee" (01/25/2018)   Dysrhythmia    atrial fib/flutter   GERD (gastroesophageal reflux disease)    occ   HTN (hypertension)    Migraine    "used to have them more frequently before menopause; now only have a couple/yr" (01/25/2018)   Obesity    OSA on CPAP    PAF (paroxysmal atrial fibrillation)    s/p DCCV 2010   Peripheral vascular disease 2016   after flying in leg   Past Surgical History:  Procedure Laterality Date   ATRIAL FIBRILLATION ABLATION N/A 01/25/2018   Procedure: [place];  Surgeon: Constance Haw, MD;  Location: [place] [facility];  Service: Cardiovascular;  Laterality: N/A;   ATRIAL FIBRILLATION ABLATION N/A 04/11/2020   Procedure: ATRIAL FIBRILLATION ABLATION;  Surgeon: Constance Haw, MD;  Location: [place] [facility];  Service: Cardiovascular;  Laterality: N/A;   CARDIAC CATHETERIZATION  2005   CARDIOVERSION N/A 12/09/2017   Procedure: CARDIOVERSION;  Surgeon: Sanda Klein, MD;  Location: [place];  Service: Cardiovascular;  Laterality: N/A;   CARDIOVERSION N/A 05/09/2018   Procedure: CARDIOVERSION;  Surgeon: Josue Hector, MD;  Location: [place];  Service: Cardiovascular;  Laterality: N/A;   CARDIOVERSION N/A 01/03/2019   Procedure: CARDIOVERSION;  Surgeon: Thayer Headings, MD;  Location: [facility] ENDOSCOPY;  Service: Cardiovascular;  Laterality: N/A;   CARDIOVERSION N/A 05/27/2020   Procedure: CARDIOVERSION;  Surgeon: Freada Bergeron, MD;  Location: [facility] ENDOSCOPY;  Service: Cardiovascular;  Laterality: N/A;   CARDIOVERSION N/A 03/05/2022   Procedure: CARDIOVERSION;  Surgeon: Buford Dresser, MD;  Location: [place];  Service: Cardiovascular;  Laterality: N/A;   CATARACT EXTRACTION W/ INTRAOCULAR LENS IMPLANT Left 2016   ELECTROPHYSIOLOGIC STUDY N/A 12/04/2015   Procedure: Atrial Fibrillation Ablation;  Surgeon: Jawanza Zambito Meredith Leeds, MD;  Location: [place] [facility];  Service: Cardiovascular;  Laterality: N/A;   JOINT REPLACEMENT     TONSILLECTOMY AND ADENOIDECTOMY     TOTAL KNEE ARTHROPLASTY Right 04/23/2016   Procedure: TOTAL KNEE ARTHROPLASTY;  Surgeon: [name], MD;  Location: [place];  Service: Orthopedics;  Laterality: Right;   TUBAL LIGATION       Current Outpatient Medications  Medication Sig Dispense Refill   acetaminophen (TYLENOL) 500 MG tablet Take 1,000 mg by mouth every  6 (six) hours as needed for moderate pain or headache.     ALPRAZolam (XANAX) 0.5 MG tablet Take 0.25-1 mg by mouth daily as needed for anxiety.     apixaban (ELIQUIS) 5 MG TABS tablet TAKE 1 TABLET BY MOUTH TWICE A DAY 60 tablet 11   calcium carbonate (TUMS - DOSED IN MG ELEMENTAL CALCIUM) 500 MG chewable tablet Chew 2 tablets by mouth daily as needed for indigestion or heartburn.      Cholecalciferol (VITAMIN D3) 125 MCG (5000 UT) TABS Take 5,000 Units by mouth daily.      escitalopram (LEXAPRO) 10 MG tablet Take 10 mg by mouth every evening.   3   metoprolol succinate (TOPROL-XL) 50 MG 24 hr tablet TAKE 1 TABLET BY MOUTH EVERY DAY WITH OR IMMEDIATELY FOLLOWING A MEAL 90 tablet 2   Multiple Vitamins-Minerals (ICAPS AREDS FORMULA PO) Take 1 tablet by mouth in the morning and at bedtime.     Polyethyl Glycol-Propyl Glycol (SYSTANE) 0.4-0.3 % SOLN Place 1 drop into both eyes 3 (three) times daily as needed (dry/irritated eyes.).     propafenone (RYTHMOL SR) 325 MG 12 hr capsule TAKE 1 CAPSULE (325 MG TOTAL) BY MOUTH 2 (TWO) TIMES DAILY. 180 capsule 2   vitamin B-12 (CYANOCOBALAMIN) 1000 MCG tablet Take 1,000 mcg by mouth daily.     No current facility-administered medications for this visit.    Allergies:   Benazepril, Adhesive [tape], and Sulfa antibiotics   Social History:  The patient  reports that she has never smoked. She has never used smokeless tobacco. She reports current alcohol use of about 1.0 standard drink of alcohol per week. She reports that she does not use drugs.   Family History:  The patient's family history includes Heart disease in her mother; Stomach cancer in her paternal grandfather.   ROS:  Please see the history of present illness.   Otherwise, review of systems is positive for none.   All other systems are reviewed and negative.   PHYSICAL EXAM: VS:  BP 124/78   Pulse 63   Ht 6\' 1"  (1.854 m)   Wt 279 lb 6.4 oz (126.7 kg)   SpO2 95%   BMI 36.86 kg/m  , BMI Body mass index is 36.86 kg/m. GEN: Well nourished, well developed, in no acute distress  HEENT: normal  Neck: no JVD, carotid bruits, or masses Cardiac: RRR; no murmurs, rubs, or gallops,no edema  Respiratory:  clear to auscultation bilaterally, normal work of breathing GI: soft, nontender, nondistended, + BS MS: no deformity or atrophy  Skin: warm and dry Neuro:  Strength and  sensation are intact Psych: euthymic mood, full affect  EKG:  EKG is not ordered today. Personal review of the ekg ordered 09/15/22 shows sinus rhythm, right bundle branch block  Recent Labs: 02/26/2022: BUN 10; Creatinine, Ser 0.81; Hemoglobin 13.3; Magnesium 2.1; Platelets 271; Potassium 4.3; Sodium 141    Lipid Panel  No results found for: "CHOL", "TRIG", "HDL", "CHOLHDL", "VLDL", "LDLCALC", "LDLDIRECT"   Wt Readings from Last 3 Encounters:  09/29/22 279 lb 6.4 oz (126.7 kg)  09/15/22 276 lb 3.2 oz (125.3 kg)  03/15/22 299 lb 3.2 oz (135.7 kg)      Other studies Reviewed: Additional studies/ records that were reviewed today include: TTE 11/25/15 - Left ventricle: Systolic function was normal. The estimated   ejection fraction was in the range of 50% to 55%. The study is   not technically sufficient to allow evaluation of LV diastolic  function. - Left atrium: The appendage was moderately dilated. - Atrial septum: No defect or patent foramen ovale was identified.   ASSESSMENT AND PLAN:  1.  Persistent atrial fibrillation: Status post ablation x 3, most recently 04/11/2020.  Currently on propafenone, metoprolol, Eliquis.  Has remained in sinus rhythm.  She does go back into atrial fibrillation, would likely switch her to dofetilide.  She does have a right bundle branch block and thus cannot adjust the dose of her Rythmol.  2.  Hypertension: Currently well-controlled  3.  Obstructive sleep apnea: CPAP compliance encouraged  4.  Secondary hypercoagulable state: Currently on Eliquis for atrial fibrillation   Current medicines are reviewed at length with the patient today.   The patient does not have concerns regarding her medicines.  The following changes were made today: None  Labs/ tests ordered today include:  No orders of the defined types were placed in this encounter.     Disposition:   FU 6 months  Signed, Kimble Delaurentis Meredith Leeds, MD  09/29/2022 8:46 AM     [facility] [address] [place] [address] [phone] (office) [phone] (fax)

## 2022-09-29 NOTE — Patient Instructions (Signed)
Medication Instructions:  Your physician recommends that you continue on your current medications as directed. Please refer to the Current Medication list given to you today.  *If you need a refill on your cardiac medications before your next appointment, please call your pharmacy*  Lab Work: None ordered If you have labs (blood work) drawn today and your tests are completely normal, you will receive your results only by: [place] (if you have MyChart) OR A paper copy in the mail If you have any lab test that is abnormal or we need to change your treatment, we will call you to review the results.  Testing/Procedures: None ordered  Follow-Up: At [facility], you and your health needs are our priority.  As part of our continuing mission to provide you with exceptional heart care, we have created designated Provider Care Teams.  These Care Teams include your primary Cardiologist (physician) and Advanced Practice Providers (APPs -  Physician Assistants and Nurse Practitioners) who all work together to provide you with the care you need, when you need it.  Your next appointment:   6 month(s)  The format for your next appointment:   In Person  Provider:   You may see Will Meredith Leeds, MD or one of the following Advanced Practice Providers on your designated Care Team:   Tommye Standard, [place] Legrand Como "[place]" [place], PA-C [place], [place]

## 2022-10-21 DIAGNOSIS — G4733 Obstructive sleep apnea (adult) (pediatric): Secondary | ICD-10-CM | POA: Diagnosis not present

## 2022-12-01 ENCOUNTER — Emergency Department (HOSPITAL_BASED_OUTPATIENT_CLINIC_OR_DEPARTMENT_OTHER)
Admission: EM | Admit: 2022-12-01 | Discharge: 2022-12-01 | Disposition: A | Discharge status: Home / Self Care | Payer: PPO | Attending: Emergency Medicine | Admitting: Emergency Medicine

## 2022-12-01 ENCOUNTER — Encounter (HOSPITAL_BASED_OUTPATIENT_CLINIC_OR_DEPARTMENT_OTHER): Payer: Self-pay | Admitting: Emergency Medicine

## 2022-12-01 ENCOUNTER — Emergency Department (HOSPITAL_BASED_OUTPATIENT_CLINIC_OR_DEPARTMENT_OTHER): Payer: PPO

## 2022-12-01 ENCOUNTER — Other Ambulatory Visit: Payer: Self-pay

## 2022-12-01 DIAGNOSIS — S199XXA Unspecified injury of neck, initial encounter: Secondary | ICD-10-CM | POA: Diagnosis not present

## 2022-12-01 DIAGNOSIS — M25511 Pain in right shoulder: Secondary | ICD-10-CM | POA: Diagnosis not present

## 2022-12-01 DIAGNOSIS — R519 Headache, unspecified: Secondary | ICD-10-CM | POA: Insufficient documentation

## 2022-12-01 DIAGNOSIS — M542 Cervicalgia: Secondary | ICD-10-CM | POA: Diagnosis not present

## 2022-12-01 DIAGNOSIS — R110 Nausea: Secondary | ICD-10-CM | POA: Insufficient documentation

## 2022-12-01 DIAGNOSIS — Z7901 Long term (current) use of anticoagulants: Secondary | ICD-10-CM

## 2022-12-01 DIAGNOSIS — I4891 Unspecified atrial fibrillation: Secondary | ICD-10-CM | POA: Insufficient documentation

## 2022-12-01 DIAGNOSIS — W01198A Fall on same level from slipping, tripping and stumbling with subsequent striking against other object, initial encounter: Secondary | ICD-10-CM | POA: Insufficient documentation

## 2022-12-01 DIAGNOSIS — W19XXXA Unspecified fall, initial encounter: Secondary | ICD-10-CM

## 2022-12-01 NOTE — ED Provider Notes (Signed)
[place] EMERGENCY DEPARTMENT AT [facility] Provider Note   CSN: [id number] Arrival date & time: 12/01/22  1702     History  Chief Complaint  Patient presents with   Head Injury    Deborah [name] is a 67 y.o. female hx of Afib on Eliquis here for evaluation of mechanical fall. Occurred on Monday. No syncope. Hit head on right side, had nausea initially which has improved.  Took Tylenol today which helped with her headache or still is a mild headache.  She was concerned given her anticoagulation came here for evaluation.  She does have some pain to her right shoulder however has full range of motion.  No numbness or weakness.  No chest pain, shortness of breath, abdominal pain, syncope, blurred vision, pain to lower extremities, back.  Ambulatory PTA.    HPI     Home Medications Prior to Admission medications   Medication Sig Start Date End Date Taking? Authorizing Provider  acetaminophen (TYLENOL) 500 MG tablet Take 1,000 mg by mouth every 6 (six) hours as needed for moderate pain or headache.    [provider]  ALPRAZolam Prudy Feeler) 0.5 MG tablet Take 0.25-1 mg by mouth daily as needed for anxiety.    [provider]  apixaban (ELIQUIS) 5 MG TABS tablet TAKE 1 TABLET BY MOUTH TWICE A DAY 03/17/22   Camnitz, Andree Coss, MD  calcium carbonate (TUMS - DOSED IN MG ELEMENTAL CALCIUM) 500 MG chewable tablet Chew 2 tablets by mouth daily as needed for indigestion or heartburn.    [provider]  Cholecalciferol (VITAMIN D3) 125 MCG (5000 UT) TABS Take 5,000 Units by mouth daily.     [provider]  escitalopram (LEXAPRO) 10 MG tablet Take 10 mg by mouth every evening.  06/17/15   [provider]  metoprolol succinate (TOPROL-XL) 50 MG 24 hr tablet TAKE 1 TABLET BY MOUTH EVERY DAY WITH OR IMMEDIATELY FOLLOWING A MEAL 03/31/22   Camnitz, Andree Coss, MD  Multiple Vitamins-Minerals (ICAPS AREDS FORMULA PO) Take 1 tablet by mouth in the  morning and at bedtime.    [provider]  Polyethyl Glycol-Propyl Glycol (SYSTANE) 0.4-0.3 % SOLN Place 1 drop into both eyes 3 (three) times daily as needed (dry/irritated eyes.).    [provider]  propafenone (RYTHMOL SR) 325 MG 12 hr capsule TAKE 1 CAPSULE (325 MG TOTAL) BY MOUTH 2 (TWO) TIMES DAILY. 09/06/22   Fenton, Clint R, PA  vitamin B-12 (CYANOCOBALAMIN) 1000 MCG tablet Take 1,000 mcg by mouth daily.    [provider]      Allergies    Benazepril, Adhesive [tape], and Sulfa antibiotics    Review of Systems   Review of Systems  Constitutional: Negative.   HENT: Negative.    Respiratory: Negative.    Cardiovascular: Negative.   Gastrointestinal: Negative.   Genitourinary: Negative.   Musculoskeletal:  Positive for neck pain.       Right shoulder pain  Skin: Negative.   Neurological:  Positive for headaches.  All other systems reviewed and are negative.   Physical Exam Updated Vital Signs BP (!) 120/54   Pulse (!) 52   Temp 98 F (36.7 C) (Oral)   Resp 18   SpO2 96%  Physical Exam Vitals and nursing note reviewed.  Constitutional:      General: She is not in acute distress.    Appearance: She is well-developed. She is not ill-appearing, toxic-appearing or diaphoretic.  HENT:  Head: Normocephalic and atraumatic.  Eyes:     Pupils: Pupils are equal, round, and reactive to light.  Neck:     Comments: Tenderness right trapezius.  No midline cervical tenderness, step-off Cardiovascular:     Rate and Rhythm: Normal rate.     Pulses: Normal pulses.     Heart sounds: Normal heart sounds.  Pulmonary:     Effort: Pulmonary effort is normal. No respiratory distress.     Breath sounds: Normal breath sounds.  Abdominal:     General: Bowel sounds are normal. There is no distension.     Palpations: Abdomen is soft.  Musculoskeletal:        General: Normal range of motion.     Cervical back: Normal range of motion.     Comments: No  midline C/T/L tenderness.  Bilateral lower extremities with full range of motion without difficulty.  Overlying midline incision to right knee, no bony tenderness.  Mild tenderness to right anterior lateral shoulder, nontender midshaft, distal humerus.  Able to left arm overhead without difficulty.  Nontender clavicle, scapula, forearm bil  Skin:    General: Skin is warm and dry.     Capillary Refill: Capillary refill takes less than 2 seconds.  Neurological:     General: No focal deficit present.     Mental Status: She is alert.     Motor: No weakness.  Psychiatric:        Mood and Affect: Mood normal.     ED Results / Procedures / Treatments   Labs (all labs ordered are listed, but only abnormal results are displayed) Labs Reviewed - No data to display  EKG None  Radiology DG Shoulder Right  Result Date: 12/01/2022 CLINICAL DATA:  Pain, fall EXAM: RIGHT SHOULDER - 2+ VIEW COMPARISON:  None available FINDINGS: Degenerative changes in the [facility] joint with joint space narrowing and spurring. Glenohumeral joint is maintained. No acute bony abnormality. Specifically, no fracture, subluxation, or dislocation. Soft tissues are intact. IMPRESSION: Degenerative changes in the right AC joint. No acute bony abnormality. Electronically Signed   By: Charlett Nose M.D.   On: 12/01/2022 19:32   CT Head Wo Contrast  Result Date: 12/01/2022 CLINICAL DATA:  Headache, increasing frequency or severity; Neck trauma (Age >= 65y) EXAM: CT HEAD WITHOUT CONTRAST CT CERVICAL SPINE WITHOUT CONTRAST TECHNIQUE: Multidetector CT imaging of the head and cervical spine was performed following the standard protocol without intravenous contrast. Multiplanar CT image reconstructions of the cervical spine were also generated. RADIATION DOSE REDUCTION: This exam was performed according to the departmental dose-optimization program which includes automated exposure control, adjustment of the mA and/or kV according to patient size  and/or use of iterative reconstruction technique. COMPARISON:  None Available. FINDINGS: CT HEAD FINDINGS Brain: No evidence of acute infarction, hemorrhage, hydrocephalus, extra-axial collection or mass lesion/mass effect. Vascular: No hyperdense vessel or unexpected calcification. Skull: Negative for fracture or focal lesion. Focal soft tissue thickening along the right frontal scalp (series 3, image 72) correlate with physical exam to assess for the possibility of an underlying lesion. Sinuses/Orbits: No middle ear or mastoid effusion. Paranasal sinuses are ear. Left lens replacement. Orbits are otherwise unremarkable. Other: None. CT CERVICAL SPINE FINDINGS Alignment: Straightening of the normal cervical lordosis. Skull base and vertebrae: No acute fracture. No primary bone lesion or focal pathologic process. Soft tissues and spinal canal: No prevertebral fluid or swelling. No visible canal hematoma. Disc levels:  No evidence of high-grade spinal stenosis. Upper chest: Negative.  Other: None IMPRESSION: 1. No acute intracranial abnormality. 2. No acute fracture or traumatic subluxation of the cervical spine. 3. Focal skin thickening along the right frontal scalp. Correlate with physical exam to assess for the possibility of an underlying lesion. Electronically Signed   By: Lorenza Cambridge M.D.   On: 12/01/2022 18:14   CT Cervical Spine Wo Contrast  Result Date: 12/01/2022 CLINICAL DATA:  Headache, increasing frequency or severity; Neck trauma (Age >= 65y) EXAM: CT HEAD WITHOUT CONTRAST CT CERVICAL SPINE WITHOUT CONTRAST TECHNIQUE: Multidetector CT imaging of the head and cervical spine was performed following the standard protocol without intravenous contrast. Multiplanar CT image reconstructions of the cervical spine were also generated. RADIATION DOSE REDUCTION: This exam was performed according to the departmental dose-optimization program which includes automated exposure control, adjustment of the mA and/or  kV according to patient size and/or use of iterative reconstruction technique. COMPARISON:  None Available. FINDINGS: CT HEAD FINDINGS Brain: No evidence of acute infarction, hemorrhage, hydrocephalus, extra-axial collection or mass lesion/mass effect. Vascular: No hyperdense vessel or unexpected calcification. Skull: Negative for fracture or focal lesion. Focal soft tissue thickening along the right frontal scalp (series 3, image 72) correlate with physical exam to assess for the possibility of an underlying lesion. Sinuses/Orbits: No middle ear or mastoid effusion. Paranasal sinuses are ear. Left lens replacement. Orbits are otherwise unremarkable. Other: None. CT CERVICAL SPINE FINDINGS Alignment: Straightening of the normal cervical lordosis. Skull base and vertebrae: No acute fracture. No primary bone lesion or focal pathologic process. Soft tissues and spinal canal: No prevertebral fluid or swelling. No visible canal hematoma. Disc levels:  No evidence of high-grade spinal stenosis. Upper chest: Negative. Other: None IMPRESSION: 1. No acute intracranial abnormality. 2. No acute fracture or traumatic subluxation of the cervical spine. 3. Focal skin thickening along the right frontal scalp. Correlate with physical exam to assess for the possibility of an underlying lesion. Electronically Signed   By: Lorenza Cambridge M.D.   On: 12/01/2022 18:14    Procedures Procedures    Medications Ordered in ED Medications - No data to display  ED Course/ Medical Decision Making/ A&P   67 year old chronic anticoagulation for A-fib here for evaluation mechanical fall which occurred 2 days PTA.  Had a mild headache, neck pain and right shoulder pain.  She is been ambulatory since.  She has a nonfocal neuroexam without deficits.  She denies any syncope.  She has some mild tenderness to her right trapezius.  She has full range of motion to her extremities, no midline C/T/L tenderness.  Will plan on imaging and reassess>>  patient does not want anything for pain at this time  Imaging personally viewed and interpreted:  CT head, cervical without any acute abnormality X-ray right shoulder with degenerative changes, no fracture, dislocation  Discussed results with patient, friend in room.  She is ambulatory here.  She does not want anything for pain at this time.  Will have her follow-up outpatient, return for new or worsening symptoms  The patient has been appropriately medically screened and/or stabilized in the ED. I have low suspicion for any other emergent medical condition which would require further screening, evaluation or treatment in the ED or require inpatient management.  Patient is hemodynamically stable and in no acute distress.  Patient able to ambulate in department prior to ED.  Evaluation does not show acute pathology that would require ongoing or additional emergent interventions while in the emergency department or further inpatient treatment.  I  have discussed the diagnosis with the patient and answered all questions.  Pain is been managed while in the emergency department and patient has no further complaints prior to discharge.  Patient is comfortable with plan discussed in room and is stable for discharge at this time.  I have discussed strict return precautions for returning to the emergency department.  Patient was encouraged to follow-up with PCP/specialist refer to at discharge.                             Medical Decision Making Amount and/or Complexity of Data Reviewed External Data Reviewed: labs, radiology and notes. Radiology: ordered and independent interpretation performed. Decision-making details documented in ED Course.  Risk OTC drugs. Decision regarding hospitalization. Diagnosis or treatment significantly limited by social determinants of health.         Final Clinical Impression(s) / ED Diagnoses Final diagnoses:  Fall, initial encounter  Chronic anticoagulation   Acute pain of right shoulder    Rx / DC Orders ED Discharge Orders     None         [place] Nick A, PA-C 12/01/22 2107    Lonell Grandchild, MD 12/02/22 1236

## 2022-12-01 NOTE — ED Triage Notes (Signed)
Trip/slip  Fell and hit head on wall. Happened Monday. Today started with headache today and some nausea. Also c/o some shoulder and neck pain On eliquis

## 2022-12-01 NOTE — Discharge Instructions (Signed)
It was a pleasure taking care of you here in the emergency department  Your workup today was reassuring.  Recommend continuing Tylenol as needed for headache, prescribed lidocaine patches for your right shoulder pain as well as some muscle relaxers.  Make sure to follow-up outpatient with your primary care provider, return for new or worsening symptoms

## 2022-12-02 DIAGNOSIS — W19XXXA Unspecified fall, initial encounter: Secondary | ICD-10-CM | POA: Diagnosis not present

## 2022-12-02 DIAGNOSIS — M25561 Pain in right knee: Secondary | ICD-10-CM | POA: Diagnosis not present

## 2022-12-10 ENCOUNTER — Encounter: Payer: Self-pay | Admitting: Cardiology

## 2022-12-17 NOTE — Telephone Encounter (Signed)
Left message to call back   (If still in afib -- per Camnitz:  stop Rythmol and schedule appt w/ AFib clinic/[facility] to discuss Tikosyn)

## 2022-12-20 NOTE — Telephone Encounter (Signed)
Patient is returning call.  °

## 2022-12-20 NOTE — Telephone Encounter (Signed)
Pt reports she is not sure if she is in afib. She has purchased Kardia mobile and it should arrive tomorrow. She will send in strips once received and we can determine plan of care from there. Pt agreeable to plan.

## 2022-12-27 ENCOUNTER — Ambulatory Visit (HOSPITAL_COMMUNITY)
Admission: RE | Admit: 2022-12-27 | Discharge: 2022-12-27 | Disposition: A | Discharge status: Home / Self Care | Payer: PPO | Source: Ambulatory Visit | Attending: Internal Medicine | Admitting: Internal Medicine

## 2022-12-27 ENCOUNTER — Encounter (HOSPITAL_COMMUNITY): Payer: Self-pay | Admitting: Internal Medicine

## 2022-12-27 VITALS — BP 122/84 | HR 89 | Ht 73.0 in | Wt 280.0 lb

## 2022-12-27 DIAGNOSIS — D6869 Other thrombophilia: Secondary | ICD-10-CM

## 2022-12-27 DIAGNOSIS — Z79899 Other long term (current) drug therapy: Secondary | ICD-10-CM | POA: Insufficient documentation

## 2022-12-27 DIAGNOSIS — G4733 Obstructive sleep apnea (adult) (pediatric): Secondary | ICD-10-CM | POA: Insufficient documentation

## 2022-12-27 DIAGNOSIS — I4819 Other persistent atrial fibrillation: Secondary | ICD-10-CM | POA: Insufficient documentation

## 2022-12-27 DIAGNOSIS — Z7901 Long term (current) use of anticoagulants: Secondary | ICD-10-CM | POA: Insufficient documentation

## 2022-12-27 DIAGNOSIS — I4510 Unspecified right bundle-branch block: Secondary | ICD-10-CM | POA: Insufficient documentation

## 2022-12-27 NOTE — Addendum Note (Signed)
Encounter addended by: Eustace Pen, PA-C on: 12/27/2022 2:31 PM  Actions taken: Clinical Note Signed

## 2022-12-27 NOTE — Progress Notes (Addendum)
Primary Care Physician: Blair Heys, MD Referring Physician: Dr. Rogene Houston Deborah [name] is a 67 y.o. female with a h/o persistent afib that is in the afib clinic for f/u on    ablation one month ago.  She has had 2 ablations in the past and several cardioversion's in the past. She remains on  Propafenone  325 mg bid.  She reports that she has not noted any afib since the procedure. She feels well. She is surprised to find out she is in afib at 84 bpm.  No swallowing or groin issues since the procedure. She  did have a fall yesterday, fell backward from a squatting position,  and is questioning if that could have thrown her out of rhythm.   F/u in afib clinic, 11/17. Ekg shows that pt continues with afib . She has noted taking her BP/HR issues since last visit,  that the pulse indicator light has been showing an irregular pulse so I feel that she is in persistent afib. I discussed a cardioversion and she is in agreement. No missed anticoagulation.   F/u successful cardioversion, 06/03/20. She continues in SR, she feels improved. No concerns voiced today.  Follow up in the AF clinic 09/01/20. Patient continued to have atrial fibrillation post repeat ablation and propafenone was resumed. Patient converted to SR with the medication. She is feeling well today. She denies any bleeding issues on anticoagulation.   Follow up in the AF clinic 02/26/22. She reports that she has been in afib for about two weeks with symptoms of fatigue and SOB with exertion. There were no specific triggers that she could identify. She called Dr Elberta Fortis office who recommended dofetilide admission.   Follow up in the AF clinic 03/15/22. Patient is s/p DCCV on 03/05/22. She feels well today and is in SR. No bleeding issues on anticoagulation.   Follow up in the AF clinic 09/15/22. Patient reports that she has done well since her last visit and has not had any afib episodes. No bleeding issues on anticoagulation.   On follow up  12/27/22, patient is currently in rate controlled Afib. Patient sent Kardiamobile strip to Dr. Elberta Fortis on 6/27 which showed Afib. Dr. Elberta Fortis recommended to stop rhythmol and consider other AAD therapy. She is currently on lexapro 10 mg daily.   Today, she denies symptoms of palpitations, chest pain, orthopnea, PND, lower extremity edema, dizziness, presyncope, syncope, or neurologic sequela. The patient is tolerating medications without difficulties and is otherwise without complaint today.   Past Medical History:  Diagnosis Date   A-fib (HCC)    Anemia    hx    Anxiety    Arthritis    "was in right knee" (01/25/2018)   Dysrhythmia    atrial fib/flutter   GERD (gastroesophageal reflux disease)    occ   HTN (hypertension)    Migraine    "used to have them more frequently before menopause; now only have a couple/yr" (01/25/2018)   Obesity    OSA on CPAP    PAF (paroxysmal atrial fibrillation) (HCC)    s/p DCCV 2010   Peripheral vascular disease (HCC) 2016   after flying in leg   Past Surgical History:  Procedure Laterality Date   ATRIAL FIBRILLATION ABLATION N/A 01/25/2018   Procedure: ATRIAL FIBRILLATION ABLATION;  Surgeon: Regan Lemming, MD;  Location: [facility] INVASIVE CV LAB;  Service: Cardiovascular;  Laterality: N/A;   ATRIAL FIBRILLATION ABLATION N/A 04/11/2020   Procedure: ATRIAL FIBRILLATION ABLATION;  Surgeon: Regan Lemming, MD;  Location: [facility] INVASIVE CV LAB;  Service: Cardiovascular;  Laterality: N/A;   CARDIAC CATHETERIZATION  2005   CARDIOVERSION N/A 12/09/2017   Procedure: CARDIOVERSION;  Surgeon: Thurmon Fair, MD;  Location: [facility] ENDOSCOPY;  Service: Cardiovascular;  Laterality: N/A;   CARDIOVERSION N/A 05/09/2018   Procedure: CARDIOVERSION;  Surgeon: Wendall Stade, MD;  Location: [facility] ENDOSCOPY;  Service: Cardiovascular;  Laterality: N/A;   CARDIOVERSION N/A 01/03/2019   Procedure: CARDIOVERSION;  Surgeon: Vesta Mixer, MD;  Location: [facility] ENDOSCOPY;  Service:  Cardiovascular;  Laterality: N/A;   CARDIOVERSION N/A 05/27/2020   Procedure: CARDIOVERSION;  Surgeon: Meriam Sprague, MD;  Location: [facility] ENDOSCOPY;  Service: Cardiovascular;  Laterality: N/A;   CARDIOVERSION N/A 03/05/2022   Procedure: CARDIOVERSION;  Surgeon: Jodelle Red, MD;  Location: [facility] ENDOSCOPY;  Service: Cardiovascular;  Laterality: N/A;   CATARACT EXTRACTION W/ INTRAOCULAR LENS IMPLANT Left 2016   ELECTROPHYSIOLOGIC STUDY N/A 12/04/2015   Procedure: Atrial Fibrillation Ablation;  Surgeon: Will Jorja Loa, MD;  Location: [facility] INVASIVE CV LAB;  Service: Cardiovascular;  Laterality: N/A;   JOINT REPLACEMENT     TONSILLECTOMY AND ADENOIDECTOMY     TOTAL KNEE ARTHROPLASTY Right 04/23/2016   Procedure: TOTAL KNEE ARTHROPLASTY;  Surgeon: Frederico Hamman, MD;  Location: [facility] OR;  Service: Orthopedics;  Laterality: Right;   TUBAL LIGATION      Current Outpatient Medications  Medication Sig Dispense Refill   acetaminophen (TYLENOL) 500 MG tablet Take 1,000 mg by mouth every 6 (six) hours as needed for moderate pain or headache.     ALPRAZolam (XANAX) 0.5 MG tablet Take 0.25-1 mg by mouth daily as needed for anxiety.     apixaban (ELIQUIS) 5 MG TABS tablet TAKE 1 TABLET BY MOUTH TWICE A DAY 60 tablet 11   calcium carbonate (TUMS - DOSED IN MG ELEMENTAL CALCIUM) 500 MG chewable tablet Chew 2 tablets by mouth daily as needed for indigestion or heartburn.     Cholecalciferol (VITAMIN D3) 125 MCG (5000 UT) TABS Take 5,000 Units by mouth daily.      escitalopram (LEXAPRO) 10 MG tablet Take 10 mg by mouth every evening.   3   metoprolol succinate (TOPROL-XL) 50 MG 24 hr tablet TAKE 1 TABLET BY MOUTH EVERY DAY WITH OR IMMEDIATELY FOLLOWING A MEAL 90 tablet 2   Multiple Vitamins-Minerals (ICAPS AREDS FORMULA PO) Take 1 tablet by mouth in the morning and at bedtime.     Polyethyl Glycol-Propyl Glycol (SYSTANE) 0.4-0.3 % SOLN Place 1 drop into both eyes 3 (three) times daily as needed  (dry/irritated eyes.).     vitamin B-12 (CYANOCOBALAMIN) 1000 MCG tablet Take 1,000 mcg by mouth daily.     propafenone (RYTHMOL SR) 325 MG 12 hr capsule TAKE 1 CAPSULE (325 MG TOTAL) BY MOUTH 2 (TWO) TIMES DAILY. (Patient not taking: Reported on 12/27/2022) 180 capsule 2   No current facility-administered medications for this encounter.    Allergies  Allergen Reactions   Benazepril Cough   Adhesive [Tape] Rash and Other (See Comments)    bandaides   Sulfa Antibiotics Rash    ROS- All systems are reviewed and negative except as per the HPI above  Physical Exam: Vitals:   12/27/22 1343  BP: 122/84  Pulse: 89  Weight: 127 kg  Height: 6\' 1"  (1.854 m)     Wt Readings from Last 3 Encounters:  12/27/22 127 kg  09/29/22 126.7 kg  09/15/22 125.3 kg    Labs: Lab Results  Component Value Date   NA 141 02/26/2022   K 4.3 02/26/2022   CL 107 02/26/2022   CO2 29 02/26/2022   GLUCOSE 86 02/26/2022   BUN 10 02/26/2022   CREATININE 0.81 02/26/2022   CALCIUM 9.4 02/26/2022   MG 2.1 02/26/2022   Lab Results  Component Value Date   INR WILL FOLLOW 09/23/2016   INR 1.0 09/23/2016   No results found for: "CHOL", "HDL", "LDLCALC", "TRIG"   GEN- The patient is well appearing, alert and oriented x 3 today.   Head- normocephalic, atraumatic Eyes-  Sclera clear, conjunctiva pink Ears- hearing intact Lungs- Clear to ausculation bilaterally, normal work of breathing Heart- Irregular rate and rhythm, no murmurs, rubs or gallops, PMI not laterally displaced Extremities- no clubbing, cyanosis, or edema MS- no significant deformity or atrophy Skin- no rash or lesion Psych- euthymic mood, full affect Neuro- strength and sensation are intact   EKG today demonstrates Afib RBBB HR 89 PR * ms QRS 150 ms QT/Qtc 406/493 ms  ECHO 11/25/2015: Study Conclusions   - Left ventricle: Systolic function was normal. The estimated    ejection fraction was in the range of 50% to 55%. The  study is    not technically sufficient to allow evaluation of LV diastolic    function.  - Left atrium: The appendage was moderately dilated.  - Atrial septum: No defect or patent foramen ovale was identified.    Epic records reviewed   CHA2DS2-VASc Score = 2  The patient's score is based upon: CHF History: 0 HTN History: 0 Diabetes History: 0 Stroke History: 0 Vascular Disease History: 0 Age Score: 1 Gender Score: 1      ASSESSMENT AND PLAN: 1. Persistent Atrial Fibrillation (ICD10:  I48.19) The patient's CHA2DS2-VASc score is 2, indicating a 2.2% annual risk of stroke.   S/p ablation x 3, most recent 04/11/20 Afib since end of June 2024 - Dr. Elberta Fortis recommended stopping propafenone and consider other AAD.  We discussed Tikosyn and amiodarone as options for treatment, with amiodarone overall being less favored due to age. After discussion, she will call insurance for pricing on Tikosyn. Advised lexapro would have to be transitioned to Cymbalta or Effexor which do not affect QT interval.   Continue Eliquis 5 mg BID  Qtc 430 ms in NSR (corrected due to RBBB)   2. OSA Encouraged compliance with CPAP therapy.   She will call to confirm decision and be scheduled for Tikosyn admission.   Lake Bells, PA-C Afib Clinic [facility] [address] [place], [place] [address] [phone]

## 2022-12-28 ENCOUNTER — Other Ambulatory Visit: Payer: Self-pay | Admitting: Cardiology

## 2023-01-20 DIAGNOSIS — G4733 Obstructive sleep apnea (adult) (pediatric): Secondary | ICD-10-CM | POA: Diagnosis not present

## 2023-03-01 ENCOUNTER — Encounter: Payer: Self-pay | Admitting: Cardiology

## 2023-03-04 NOTE — Telephone Encounter (Signed)
Left message informing pt I would discuss w/ MD next week and let her know -- aware that I don't think it will be an issue to only see him if she prefers, unless it is an acute need which may lead to f/u w/ APP/afib clinic.

## 2023-03-07 ENCOUNTER — Other Ambulatory Visit: Payer: Self-pay | Admitting: Cardiology

## 2023-03-07 DIAGNOSIS — I480 Paroxysmal atrial fibrillation: Secondary | ICD-10-CM

## 2023-03-08 NOTE — Telephone Encounter (Signed)
Prescription refill request for Eliquis received. Indication:afib Last office visit:7/24 Scr:0.81  9/23 Age: 68 Weight:127  kg  Prescription refilled

## 2023-03-11 NOTE — Telephone Encounter (Signed)
Late Entry: Spoke w/ pt yesterday. Will discuss Tikosyn w/ Dr. Elberta Fortis next week.

## 2023-03-22 DIAGNOSIS — D6869 Other thrombophilia: Secondary | ICD-10-CM | POA: Diagnosis not present

## 2023-03-22 DIAGNOSIS — E538 Deficiency of other specified B group vitamins: Secondary | ICD-10-CM | POA: Diagnosis not present

## 2023-03-22 DIAGNOSIS — G629 Polyneuropathy, unspecified: Secondary | ICD-10-CM | POA: Diagnosis not present

## 2023-03-22 DIAGNOSIS — R7309 Other abnormal glucose: Secondary | ICD-10-CM | POA: Diagnosis not present

## 2023-03-22 DIAGNOSIS — Z9181 History of falling: Secondary | ICD-10-CM | POA: Diagnosis not present

## 2023-03-22 DIAGNOSIS — N183 Chronic kidney disease, stage 3 unspecified: Secondary | ICD-10-CM | POA: Diagnosis not present

## 2023-03-22 DIAGNOSIS — Z23 Encounter for immunization: Secondary | ICD-10-CM | POA: Diagnosis not present

## 2023-03-22 DIAGNOSIS — Z0000 Encounter for general adult medical examination without abnormal findings: Secondary | ICD-10-CM | POA: Diagnosis not present

## 2023-03-22 DIAGNOSIS — G4733 Obstructive sleep apnea (adult) (pediatric): Secondary | ICD-10-CM | POA: Diagnosis not present

## 2023-03-22 DIAGNOSIS — Z1331 Encounter for screening for depression: Secondary | ICD-10-CM | POA: Diagnosis not present

## 2023-03-22 DIAGNOSIS — I480 Paroxysmal atrial fibrillation: Secondary | ICD-10-CM | POA: Diagnosis not present

## 2023-03-22 DIAGNOSIS — I10 Essential (primary) hypertension: Secondary | ICD-10-CM | POA: Diagnosis not present

## 2023-04-28 ENCOUNTER — Other Ambulatory Visit (HOSPITAL_COMMUNITY): Payer: Self-pay

## 2023-04-28 ENCOUNTER — Telehealth (HOSPITAL_COMMUNITY): Payer: Self-pay | Admitting: Pharmacy Technician

## 2023-04-28 NOTE — Telephone Encounter (Signed)
Apologized to pt for not following up sooner.  She tells me that she is still in afib, but was also still thinking about things. She has decided to proceed with Tikosyn adx. Aware I will send a note to afib clinic to arrange. Aware I will resend Tikosyn adx information via mychart. Patient verbalized understanding and agreeable to plan.

## 2023-04-28 NOTE — Telephone Encounter (Signed)
Patient Product/process development scientist completed.    The patient is insured through [employer]/ [employer]. Patient has Medicare and is not eligible for a copay card, but may be able to apply for patient assistance, if available.    Ran test claim for dofetilide (Tikosyn) 500 mcg and the current 30 day co-pay is $12.71.   This test claim was processed through [facility]- copay amounts may vary at other pharmacies due to pharmacy/plan contracts, or as the patient moves through the different stages of their insurance plan.     Roland Earl, CPHT Pharmacy Technician III Certified Patient Advocate [facility] Patient Advocate Team Direct Number: [phone]  Fax: [phone]

## 2023-04-29 ENCOUNTER — Telehealth: Payer: Self-pay | Admitting: Pharmacist

## 2023-04-29 ENCOUNTER — Other Ambulatory Visit (HOSPITAL_COMMUNITY): Payer: Self-pay | Admitting: *Deleted

## 2023-04-29 NOTE — Telephone Encounter (Signed)
Medication list reviewed in anticipation of upcoming Tikosyn initiation. Patient is taking two contraindicated or QTc prolonging medications.   Concurrent use of ESCITALOPRAM and Tikosyn  may result in an increased risk of QT interval prolongation. Patient should reach out to PCP for alternative such as Duloxetine.   Concurrent use of Propafenone and Tikosyn may result in an increased risk of cardiotoxicity (QT prolongation, torsades de pointes, cardiac arrest). Please verify patient has discontinued propafenone.  Patient is anticoagulated on Eliquis on the appropriate dose. Please ensure that patient has not missed any anticoagulation doses in the 3 weeks prior to Tikosyn initiation.   Patient will need to be counseled to avoid use of Benadryl while on Tikosyn and in the 2-3 days prior to Tikosyn initiation.

## 2023-04-29 NOTE — Telephone Encounter (Signed)
-----   Message from Nurse Stacy C sent at 04/28/2023  2:11 PM EDT ----- Regarding: tikosyn Pt for tikosyn please review meds thanks stacy

## 2023-05-02 NOTE — Telephone Encounter (Signed)
Pt to contact pcp regarding need to change lexapro to different agent. Will notify office once transitioned to schedule admission.  Pt is off Propafenone as of July pt states.

## 2023-05-03 DIAGNOSIS — F331 Major depressive disorder, recurrent, moderate: Secondary | ICD-10-CM | POA: Diagnosis not present

## 2023-05-04 NOTE — Telephone Encounter (Signed)
Patient stopping lexapro per PCP and starting FLUoxetine HCl 10 MG daily.

## 2023-05-13 ENCOUNTER — Encounter (HOSPITAL_COMMUNITY): Payer: Self-pay

## 2023-05-16 DIAGNOSIS — F331 Major depressive disorder, recurrent, moderate: Secondary | ICD-10-CM | POA: Diagnosis not present

## 2023-05-17 ENCOUNTER — Inpatient Hospital Stay (HOSPITAL_COMMUNITY)
Admission: EM | Admit: 2023-05-17 | Discharge: 2023-05-20 | DRG: 310 | Disposition: A | Discharge status: Home / Self Care | Payer: PPO | Source: Ambulatory Visit | Attending: Cardiology | Admitting: Cardiology

## 2023-05-17 ENCOUNTER — Ambulatory Visit (HOSPITAL_BASED_OUTPATIENT_CLINIC_OR_DEPARTMENT_OTHER)
Admission: RE | Admit: 2023-05-17 | Discharge: 2023-05-17 | Disposition: A | Discharge status: Home / Self Care | Payer: PPO | Source: Ambulatory Visit | Attending: Physician Assistant | Admitting: Physician Assistant

## 2023-05-17 VITALS — BP 122/80 | HR 77 | Ht 73.0 in | Wt 280.4 lb

## 2023-05-17 DIAGNOSIS — I4510 Unspecified right bundle-branch block: Secondary | ICD-10-CM | POA: Diagnosis not present

## 2023-05-17 DIAGNOSIS — W19XXXA Unspecified fall, initial encounter: Secondary | ICD-10-CM | POA: Diagnosis present

## 2023-05-17 DIAGNOSIS — R001 Bradycardia, unspecified: Secondary | ICD-10-CM | POA: Diagnosis not present

## 2023-05-17 DIAGNOSIS — Z79899 Other long term (current) drug therapy: Secondary | ICD-10-CM | POA: Diagnosis not present

## 2023-05-17 DIAGNOSIS — Z48812 Encounter for surgical aftercare following surgery on the circulatory system: Secondary | ICD-10-CM | POA: Insufficient documentation

## 2023-05-17 DIAGNOSIS — I4819 Other persistent atrial fibrillation: Secondary | ICD-10-CM | POA: Diagnosis not present

## 2023-05-17 DIAGNOSIS — Z7901 Long term (current) use of anticoagulants: Secondary | ICD-10-CM | POA: Insufficient documentation

## 2023-05-17 DIAGNOSIS — G4733 Obstructive sleep apnea (adult) (pediatric): Secondary | ICD-10-CM | POA: Insufficient documentation

## 2023-05-17 DIAGNOSIS — I10 Essential (primary) hypertension: Secondary | ICD-10-CM | POA: Diagnosis not present

## 2023-05-17 DIAGNOSIS — F419 Anxiety disorder, unspecified: Secondary | ICD-10-CM | POA: Diagnosis present

## 2023-05-17 LAB — BASIC METABOLIC PANEL
Anion gap: 8 (ref 5–15)
BUN: 12 mg/dL (ref 8–23)
CO2: 25 mmol/L (ref 22–32)
Calcium: 9.3 mg/dL (ref 8.9–10.3)
Chloride: 106 mmol/L (ref 98–111)
Creatinine, Ser: 0.95 mg/dL (ref 0.44–1.00)
GFR, Estimated: >60 mL/min (ref >60)
Glucose, Bld: 97 mg/dL (ref 70–99)
Potassium: 4.4 mmol/L (ref 3.5–5.1)
Sodium: 139 mmol/L (ref 135–145)

## 2023-05-17 LAB — MAGNESIUM: Magnesium: 2.2 mg/dL (ref 1.7–2.4)

## 2023-05-17 LAB — HIV ANTIBODY (ROUTINE TESTING W REFLEX): HIV Screen 4th Generation wRfx: NONREACTIVE

## 2023-05-17 MED ORDER — SODIUM CHLORIDE 0.9% FLUSH
3.0000 mL | Freq: Two times a day (BID) | INTRAVENOUS | Status: DC
  Administered 2023-05-17 – 2023-05-20 (×5): 3 mL via INTRAVENOUS

## 2023-05-17 MED ORDER — VITAMIN D 25 MCG (1000 UNIT) PO TABS
5000.0000 [IU] | ORAL_TABLET | Freq: Every day | ORAL | Status: DC
  Administered 2023-05-18 – 2023-05-20 (×3): 5000 [IU] via ORAL
  Filled 2023-05-17 (×3): qty 5

## 2023-05-17 MED ORDER — ACETAMINOPHEN 500 MG PO TABS
1000.0000 mg | ORAL_TABLET | Freq: Four times a day (QID) | ORAL | Status: DC | PRN
  Administered 2023-05-17 – 2023-05-19 (×2): 1000 mg via ORAL
  Filled 2023-05-17 (×2): qty 2

## 2023-05-17 MED ORDER — VITAMIN B-12 1000 MCG PO TABS
1000.0000 ug | ORAL_TABLET | Freq: Every day | ORAL | Status: DC
  Administered 2023-05-18 – 2023-05-20 (×3): 1000 ug via ORAL
  Filled 2023-05-17 (×3): qty 1

## 2023-05-17 MED ORDER — ALPRAZOLAM 0.25 MG PO TABS: 0.2500 mg | ORAL_TABLET | Freq: Every day | ORAL | Status: DC | PRN

## 2023-05-17 MED ORDER — SODIUM CHLORIDE 0.9 % IV SOLN: 250.0000 mL | INTRAVENOUS | Status: DC | PRN

## 2023-05-17 MED ORDER — PROSIGHT PO TABS: ORAL_TABLET | Freq: Every day | ORAL | Status: DC

## 2023-05-17 MED ORDER — DOFETILIDE 500 MCG PO CAPS
500.0000 ug | ORAL_CAPSULE | Freq: Two times a day (BID) | ORAL | Status: DC
  Administered 2023-05-17 – 2023-05-20 (×6): 500 ug via ORAL
  Filled 2023-05-17 (×6): qty 1

## 2023-05-17 MED ORDER — POLYVINYL ALCOHOL 1.4 % OP SOLN: 1.0000 [drp] | Freq: Three times a day (TID) | OPHTHALMIC | Status: DC | PRN

## 2023-05-17 MED ORDER — METOPROLOL SUCCINATE ER 50 MG PO TB24
50.0000 mg | ORAL_TABLET | Freq: Every day | ORAL | Status: DC
  Administered 2023-05-18: 50 mg via ORAL
  Filled 2023-05-17 (×2): qty 1

## 2023-05-17 MED ORDER — SODIUM CHLORIDE 0.9% FLUSH: 3.0000 mL | INTRAVENOUS | Status: DC | PRN

## 2023-05-17 MED ORDER — APIXABAN 5 MG PO TABS
5.0000 mg | ORAL_TABLET | Freq: Two times a day (BID) | ORAL | Status: DC
  Administered 2023-05-17 – 2023-05-20 (×6): 5 mg via ORAL
  Filled 2023-05-17 (×6): qty 1

## 2023-05-17 MED ORDER — BUPROPION HCL ER (XL) 150 MG PO TB24
150.0000 mg | ORAL_TABLET | Freq: Every day | ORAL | Status: DC
  Administered 2023-05-18 – 2023-05-20 (×3): 150 mg via ORAL
  Filled 2023-05-17 (×3): qty 1

## 2023-05-17 NOTE — Progress Notes (Signed)
Primary Care Physician: Blair Heys, MD (Inactive) Referring Physician: Dr. Rogene Houston Deborah [name] is a 67 y.o. female with a h/o persistent afib that is in the afib clinic for f/u on    ablation one month ago.  She has had 2 ablations in the past and several cardioversion's in the past. She remains on  Propafenone  325 mg bid.  She reports that she has not noted any afib since the procedure. She feels well. She is surprised to find out she is in afib at 84 bpm.  No swallowing or groin issues since the procedure. She  did have a fall yesterday, fell backward from a squatting position,  and is questioning if that could have thrown her out of rhythm.   F/u in afib clinic, 11/17. Ekg shows that pt continues with afib . She has noted taking her BP/HR issues since last visit,  that the pulse indicator light has been showing an irregular pulse so I feel that she is in persistent afib. I discussed a cardioversion and she is in agreement. No missed anticoagulation.   F/u successful cardioversion, 06/03/20. She continues in SR, she feels improved. No concerns voiced today.  Follow up in the AF clinic 09/01/20. Patient continued to have atrial fibrillation post repeat ablation and propafenone was resumed. Patient converted to SR with the medication. She is feeling well today. She denies any bleeding issues on anticoagulation.   Follow up in the AF clinic 02/26/22. She reports that she has been in afib for about two weeks with symptoms of fatigue and SOB with exertion. There were no specific triggers that she could identify. She called Dr Elberta Fortis office who recommended dofetilide admission.   Follow up in the AF clinic 03/15/22. Patient is s/p DCCV on 03/05/22. She feels well today and is in SR. No bleeding issues on anticoagulation.   Follow up in the AF clinic 09/15/22. Patient reports that she has done well since her last visit and has not had any afib episodes. No bleeding issues on anticoagulation.    On follow up 12/27/22, patient is currently in rate controlled Afib. Patient sent Kardiamobile strip to Dr. Elberta Fortis on 6/27 which showed Afib. Dr. Elberta Fortis recommended to stop rhythmol and consider other AAD therapy. She is currently on lexapro 10 mg daily.   Follow up in the AF clinic 05/17/23. Patient presents for dofetilide admission. She remains in afib today with symptoms of fatigue. She denies any missed doses of anticoagulation in the past 3 weeks.    Today, she denies symptoms of palpitations, chest pain, orthopnea, PND, lower extremity edema, dizziness, presyncope, syncope, or neurologic sequela. The patient is tolerating medications without difficulties and is otherwise without complaint today.   Past Medical History:  Diagnosis Date   A-fib (HCC)    Anemia    hx    Anxiety    Arthritis    "was in right knee" (01/25/2018)   Dysrhythmia    atrial fib/flutter   GERD (gastroesophageal reflux disease)    occ   HTN (hypertension)    Migraine    "used to have them more frequently before menopause; now only have a couple/yr" (01/25/2018)   Obesity    OSA on CPAP    PAF (paroxysmal atrial fibrillation) (HCC)    s/p DCCV 2010   Peripheral vascular disease (HCC) 2016   after flying in leg    No current facility-administered medications for this encounter.   No current outpatient medications  on file.   Facility-Administered Medications Ordered in Other Encounters  Medication Dose Route Frequency Provider Last Rate Last Admin   apixaban (ELIQUIS) tablet 5 mg  5 mg Oral BID Camnitz, Will Daphine Deutscher, MD       dofetilide (TIKOSYN) capsule 500 mcg  500 mcg Oral BID Camnitz, Will Daphine Deutscher, MD       sodium chloride flush (NS) 0.9 % injection 3 mL  3 mL Intravenous Q12H Camnitz, Will Daphine Deutscher, MD   3 mL at 05/17/23 1235   sodium chloride flush (NS) 0.9 % injection 3 mL  3 mL Intravenous PRN Camnitz, Andree Coss, MD        ROS- All systems are reviewed and negative except as per the HPI  above  Physical Exam: Vitals:   05/17/23 1114  BP: 122/80  Pulse: 77  Weight: 127.2 kg  Height: 6\' 1"  (1.854 m)     Wt Readings from Last 3 Encounters:  05/17/23 126.2 kg  05/17/23 127.2 kg  12/27/22 127 kg    GEN: Well nourished, well developed in no acute distress NECK: No JVD; No carotid bruits CARDIAC: Irregularly irregular rate and rhythm, no murmurs, rubs, gallops RESPIRATORY:  Clear to auscultation without rales, wheezing or rhonchi  ABDOMEN: Soft, non-tender, non-distended EXTREMITIES:  No edema; No deformity    EKG today demonstrates Afib, RBBB Vent. rate 77 BPM PR interval * ms QRS duration 150 ms QT/QTcB 420/475 ms   ECHO 11/25/2015: Study Conclusions  - Left ventricle: Systolic function was normal. The estimated    ejection fraction was in the range of 50% to 55%. The study is    not technically sufficient to allow evaluation of LV diastolic    function.  - Left atrium: The appendage was moderately dilated.  - Atrial septum: No defect or patent foramen ovale was identified.    Epic records reviewed   CHA2DS2-VASc Score = 2  The patient's score is based upon: CHF History: 0 HTN History: 0 Diabetes History: 0 Stroke History: 0 Vascular Disease History: 0 Age Score: 1 Gender Score: 1      ASSESSMENT AND PLAN: Persistent Atrial Fibrillation (ICD10:  I48.19) The patient's CHA2DS2-VASc score is 2, indicating a 2.2% annual risk of stroke.   S/p ablation x 3, most recent 04/11/20 Previously failed propafenone Patient presents for dofetilide admission. Continue Eliquis 5 mg BID, states no missed doses in the last 3 weeks. No recent benadryl use PharmD has screened medications, she has been changed from excitalopram to bupropion.  Labs today show creatinine at 0.95, K+ 4.4 and mag 2.2, CrCl calculated at 114 mL/min Continue Toprol 50 mg daily  OSA  Encouraged nightly CPAP   To be admitted later today once a bed becomes available.    Jorja Loa PA-C Afib Clinic [facility] [address] [place], [place] [address] [phone]

## 2023-05-17 NOTE — H&P (Signed)
Primary Care Physician: Blair Heys, MD (Inactive) Referring Physician: Dr. Rogene Houston Deborah [name] is a 67 y.o. female with a h/o persistent afib that is in the afib clinic for f/u on    ablation one month ago.  She has had 2 ablations in the past and several cardioversion's in the past. She remains on  Propafenone  325 mg bid.  She reports that she has not noted any afib since the procedure. She feels well. She is surprised to find out she is in afib at 84 bpm.  No swallowing or groin issues since the procedure. She  did have a fall yesterday, fell backward from a squatting position,  and is questioning if that could have thrown her out of rhythm.   F/u in afib clinic, 11/17. Ekg shows that pt continues with afib . She has noted taking her BP/HR issues since last visit,  that the pulse indicator light has been showing an irregular pulse so I feel that she is in persistent afib. I discussed a cardioversion and she is in agreement. No missed anticoagulation.   F/u successful cardioversion, 06/03/20. She continues in SR, she feels improved. No concerns voiced today.  Follow up in the AF clinic 09/01/20. Patient continued to have atrial fibrillation post repeat ablation and propafenone was resumed. Patient converted to SR with the medication. She is feeling well today. She denies any bleeding issues on anticoagulation.   Follow up in the AF clinic 02/26/22. She reports that she has been in afib for about two weeks with symptoms of fatigue and SOB with exertion. There were no specific triggers that she could identify. She called Dr Elberta Fortis office who recommended dofetilide admission.   Follow up in the AF clinic 03/15/22. Patient is s/p DCCV on 03/05/22. She feels well today and is in SR. No bleeding issues on anticoagulation.   Follow up in the AF clinic 09/15/22. Patient reports that she has done well since her last visit and has not had any afib episodes. No bleeding issues on anticoagulation.    On follow up 12/27/22, patient is currently in rate controlled Afib. Patient sent Kardiamobile strip to Dr. Elberta Fortis on 6/27 which showed Afib. Dr. Elberta Fortis recommended to stop rhythmol and consider other AAD therapy. She is currently on lexapro 10 mg daily.   Follow up in the AF clinic 05/17/23. Patient presents for dofetilide admission. She remains in afib today with symptoms of fatigue. She denies any missed doses of anticoagulation in the past 3 weeks.    Today, she denies symptoms of palpitations, chest pain, orthopnea, PND, lower extremity edema, dizziness, presyncope, syncope, or neurologic sequela. The patient is tolerating medications without difficulties and is otherwise without complaint today.   Past Medical History:  Diagnosis Date   A-fib (HCC)    Anemia    hx    Anxiety    Arthritis    "was in right knee" (01/25/2018)   Dysrhythmia    atrial fib/flutter   GERD (gastroesophageal reflux disease)    occ   HTN (hypertension)    Migraine    "used to have them more frequently before menopause; now only have a couple/yr" (01/25/2018)   Obesity    OSA on CPAP    PAF (paroxysmal atrial fibrillation) (HCC)    s/p DCCV 2010   Peripheral vascular disease (HCC) 2016   after flying in leg    Current Facility-Administered Medications  Medication Dose Route Frequency Provider Last Rate Last Admin  apixaban (ELIQUIS) tablet 5 mg  5 mg Oral BID Camnitz, Will Daphine Deutscher, MD       dofetilide (TIKOSYN) capsule 500 mcg  500 mcg Oral BID Camnitz, Will Daphine Deutscher, MD       sodium chloride flush (NS) 0.9 % injection 3 mL  3 mL Intravenous Q12H Camnitz, Will Daphine Deutscher, MD   3 mL at 05/17/23 1235   sodium chloride flush (NS) 0.9 % injection 3 mL  3 mL Intravenous PRN Camnitz, Andree Coss, MD        ROS- All systems are reviewed and negative except as per the HPI above  Physical Exam: Vitals:   05/17/23 1203  BP: 127/75  Pulse: 84  Resp: 18  Temp: 98 F (36.7 C)  TempSrc: Oral  SpO2: 98%   Weight: 126.2 kg  Height: 6\' 1"  (1.854 m)     Wt Readings from Last 3 Encounters:  05/17/23 126.2 kg  05/17/23 127.2 kg  12/27/22 127 kg    GEN: Well nourished, well developed in no acute distress NECK: No JVD; No carotid bruits CARDIAC: Irregularly irregular rate and rhythm, no murmurs, rubs, gallops RESPIRATORY:  Clear to auscultation without rales, wheezing or rhonchi  ABDOMEN: Soft, non-tender, non-distended EXTREMITIES:  No edema; No deformity    EKG today demonstrates Afib, RBBB Vent. rate 77 BPM PR interval * ms QRS duration 150 ms QT/QTcB 420/475 ms   ECHO 11/25/2015: Study Conclusions  - Left ventricle: Systolic function was normal. The estimated    ejection fraction was in the range of 50% to 55%. The study is    not technically sufficient to allow evaluation of LV diastolic    function.  - Left atrium: The appendage was moderately dilated.  - Atrial septum: No defect or patent foramen ovale was identified.    Epic records reviewed   CHA2DS2-VASc Score = 2  The patient's score is based upon: CHF History: 0 HTN History: 0 Diabetes History: 0 Stroke History: 0 Vascular Disease History: 0 Age Score: 1 Gender Score: 1      ASSESSMENT AND PLAN: Persistent Atrial Fibrillation (ICD10:  I48.19) The patient's CHA2DS2-VASc score is 2, indicating a 2.2% annual risk of stroke.   S/p ablation x 3, most recent 04/11/20 Previously failed propafenone Patient presents for dofetilide admission. Continue Eliquis 5 mg BID, states no missed doses in the last 3 weeks. No recent benadryl use PharmD has screened medications, she has been changed from excitalopram to bupropion.  Labs today show creatinine at 0.95, K+ 4.4 and mag 2.2, CrCl calculated at 114 mL/min Continue Toprol 50 mg daily  OSA  Encouraged nightly CPAP   ADDEND: Above AFib clinic note to serve as H&P Patient confirms she has been off propafenone since June Also confirms no missed Eliquis  doses Had no follow up questions regarding her hospitalization/what to expect while here Calc crCl using actual weight is 113, scheduled for dose appropriately EKG is reviewed with Dr. Lalla Brothers QTc is ok, particularly given her broad QRS/RBBB  Francis Dowse, PA-C

## 2023-05-17 NOTE — Progress Notes (Signed)
Post Tikosyn EKG shows AFIb RBBB HR 89 Qtc 523

## 2023-05-17 NOTE — TOC Initial Note (Signed)
Transition of Care [facility]) - Initial/Assessment Note    Patient Details  Name: Deborah [name] MRN: [id number] Date of Birth: Oct 10, 1955  [facility]) CM/SW Contact:    Deborah Sabal, RN Phone Number: 05/17/2023, 4:03 PM  Clinical Narrative:                  Deborah [name] w patient over the phone she is agreeable to first fill through [facility] pharmacy. Test claim shows it will be $12.71, patient informed.  Her pharmacy of choice for refill to be sent to is  [facility] [place] Address: [address], [place], [place] [address] Phone: [phone]   Expected Discharge Plan: Home/Self Care Barriers to Discharge: Continued Medical Work up   Patient Goals and CMS Choice Patient states their goals for this hospitalization and ongoing recovery are:: to go home   Choice offered to / list presented to : NA      Expected Discharge Plan and Services   Discharge Planning Services: CM Consult, Medication Assistance                                          Prior Living Arrangements/Services                       Activities of Daily Living      Permission Sought/Granted                  Emotional Assessment              Admission diagnosis:  Persistent atrial fibrillation (HCC) [I48.19] Patient Active Problem List   Diagnosis Date Noted   Hypercoagulable state due to persistent atrial fibrillation (HCC) 02/26/2022   Intermediate stage nonexudative age-related macular degeneration of right eye 10/28/2020   Persistent atrial fibrillation (HCC) 01/25/2018   Nuclear sclerosis, right 11/22/2017   Primary localized osteoarthritis of right knee 04/23/2016   Left posterior capsular opacification 10/28/2015   Nuclear sclerotic cataract of right eye 06/03/2015   Pseudophakia of left eye 02/28/2015   Paroxysmal atrial fibrillation (HCC) 02/10/2015   Obstructive sleep apnea syndrome 02/10/2015   Nuclear cataract of both eyes 11/05/2014   HTN (hypertension)     Obesity    Retinal edema 12/19/2012   High risk medication use 03/31/2012   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

## 2023-05-17 NOTE — Plan of Care (Signed)

## 2023-05-17 NOTE — Progress Notes (Signed)
Pharmacy: Dofetilide (Tikosyn) - Initial Consult Assessment and Electrolyte Replacement  Pharmacy consulted to assist in monitoring and replacing electrolytes in this 67 y.o. female admitted on 05/17/2023 undergoing dofetilide initiation.   Assessment:  Patient Exclusion Criteria: If any screening criteria checked as "Yes", then  patient  should NOT receive dofetilide until criteria item is corrected.  If "Yes" please indicate correction plan.  YES  NO Patient  Exclusion Criteria Correction Plan   [x]   []   Baseline QTc interval is greater than or equal to 440 msec. IF above YES box checked dofetilide contraindicated unless patient has ICD; then may proceed if QTc 500-550 msec or with known ventricular conduction abnormalities may proceed with QTc 550-600 msec. QTc = 475 per EKG 11/19 Continue Tikosyn and continue to follow QTc   []   [x]   Patient is known or suspected to have a digoxin level greater than 2 ng/ml: No results found for: "DIGOXIN"     []   [x]   Creatinine clearance less than 20 ml/min (calculated using Cockcroft-Gault, actual body weight and serum creatinine): Estimated Creatinine Clearance: 85.6 mL/min (by C-G formula based on SCr of 0.95 mg/dL).     []   [x]  Patient has received drugs known to prolong the QT intervals within the last 48 hours (phenothiazines, tricyclics or tetracyclic antidepressants, erythromycin, H-1 antihistamines, cisapride, fluoroquinolones, azithromycin, ondansetron).   Updated information on QT prolonging agents is available to be searched on the following database:QT prolonging agents     []   [x]   Patient received a dose of a thiazide diuretic in the last 48 hours [including hydrochlorothiazide (Oretic) alone or in any combination including triamterene (Dyazide, Maxzide)].    []   [x]  Patient received a medication known to increase dofetilide plasma concentrations prior to initial dofetilide dose:  Trimethoprim (Primsol, Proloprim) in the  last 36 hours Verapamil (Calan, Verelan) in the last 36 hours or a sustained release dose in the last 72 hours Megestrol (Megace) in the last 5 days  Cimetidine (Tagamet) in the last 6 hours Ketoconazole (Nizoral) in the last 24 hours Itraconazole (Sporanox) in the last 48 hours  Prochlorperazine (Compazine) in the last 36 hours     []   [x]   Patient is known to have a history of torsades de pointes; congenital or acquired long QT syndromes.    []   [x]   Patient has received a Class 1 antiarrhythmic with less than 2 half-lives since last dose. (Disopyramide, Quinidine, Procainamide, Lidocaine, Mexiletine, Flecainide, Propafenone)    []   [x]   Patient has received amiodarone therapy in the past 3 months or amiodarone level is greater than 0.3 ng/ml.    Labs:    Component Value Date/Time   K 4.4 05/17/2023 1151   MG 2.2 05/17/2023 1151     Plan: Select One Calculated CrCl  Dose q12h  [x]  > 60 ml/min 500 mcg  []  40-60 ml/min 250 mcg  []  20-40 ml/min 125 mcg   [x]   Physician selected initial dose within range recommended for patients level of renal function - will monitor for response.  []   Physician selected initial dose outside of range recommended for patients level of renal function - will discuss if the dose should be altered at this time.   Patient has been appropriately anticoagulated with apixaban.  Potassium: K >/= 4: Appropriate to initiate Tikosyn, no replacement needed    Magnesium: Mg >2: Appropriate to initiate Tikosyn, no replacement needed     Thank you for allowing pharmacy to participate in this patient's  care   Harland German, PharmD Clinical Pharmacist **Pharmacist phone directory can now be found on [email or web address] ([email or web address]).  Listed under [facility].  e

## 2023-05-17 NOTE — Plan of Care (Signed)
  Problem: Health Behavior/Discharge Planning: Goal: Ability to manage health-related needs will improve Outcome: Completed/Met   Problem: Nutrition: Goal: Adequate nutrition will be maintained Outcome: Completed/Met   Problem: Elimination: Goal: Will not experience complications related to bowel motility Outcome: Completed/Met Goal: Will not experience complications related to urinary retention Outcome: Completed/Met   Problem: Pain Management: Goal: General experience of comfort will improve Outcome: Completed/Met

## 2023-05-18 ENCOUNTER — Other Ambulatory Visit: Payer: Self-pay

## 2023-05-18 ENCOUNTER — Encounter (HOSPITAL_COMMUNITY): Payer: Self-pay | Admitting: Cardiology

## 2023-05-18 DIAGNOSIS — I4819 Other persistent atrial fibrillation: Secondary | ICD-10-CM | POA: Diagnosis not present

## 2023-05-18 LAB — BASIC METABOLIC PANEL
Anion gap: 7 (ref 5–15)
BUN: 12 mg/dL (ref 8–23)
CO2: 27 mmol/L (ref 22–32)
Calcium: 9 mg/dL (ref 8.9–10.3)
Chloride: 108 mmol/L (ref 98–111)
Creatinine, Ser: 0.98 mg/dL (ref 0.44–1.00)
GFR, Estimated: >60 mL/min (ref >60)
Glucose, Bld: 104 mg/dL — ABNORMAL HIGH (ref 70–99)
Potassium: 3.9 mmol/L (ref 3.5–5.1)
Sodium: 142 mmol/L (ref 135–145)

## 2023-05-18 LAB — MAGNESIUM: Magnesium: 2.1 mg/dL (ref 1.7–2.4)

## 2023-05-18 MED ORDER — SODIUM CHLORIDE 0.9 % IV SOLN: INTRAVENOUS | Status: AC

## 2023-05-18 MED ORDER — POTASSIUM CHLORIDE CRYS ER 20 MEQ PO TBCR
20.0000 meq | EXTENDED_RELEASE_TABLET | Freq: Once | ORAL | Status: AC
  Administered 2023-05-18: 20 meq via ORAL
  Filled 2023-05-18: qty 1

## 2023-05-18 NOTE — Progress Notes (Signed)
Pharmacy: Dofetilide (Tikosyn) - Follow Up Assessment and Electrolyte Replacement  Pharmacy consulted to assist in monitoring and replacing electrolytes in this 67 y.o. female admitted on 05/17/2023 undergoing dofetilide initiation. First dofetilide dose: 05/17/23  Labs:    Component Value Date/Time   K 3.9 05/18/2023 0515   MG 2.1 05/18/2023 0515     Plan: Potassium: K 3.8-3.9:  Give KCl 20 mEq po x1   Magnesium: Mg > 2: No additional supplementation needed  Thank you for allowing pharmacy to participate in this patient's care   Wilmer Floor, PharmD PGY2 Cardiology Pharmacy Resident

## 2023-05-18 NOTE — Progress Notes (Signed)
Post dose EKG is reviewed Stable QTc, shorter then machine read given broad RBBB/QRS OK to continue Tikosyn dose/load tonight  Francis Dowse, PA-C

## 2023-05-18 NOTE — Progress Notes (Addendum)
Rounding Note    Patient Name: Deborah [name] Date of Encounter: 05/18/2023  [facility] EP: Dr. Elberta Fortis   Subjective   Doing well, tolerating drug  Inpatient Medications    Scheduled Meds:  apixaban  5 mg Oral BID   buPROPion  150 mg Oral Daily   cholecalciferol  5,000 Units Oral Daily   cyanocobalamin  1,000 mcg Oral Daily   dofetilide  500 mcg Oral BID   metoprolol succinate  50 mg Oral Daily   multivitamin   Oral Daily   sodium chloride flush  3 mL Intravenous Q12H   Continuous Infusions:  PRN Meds: acetaminophen, ALPRAZolam, polyvinyl alcohol, sodium chloride flush   Vital Signs    Vitals:   05/17/23 1949 05/17/23 2329 05/18/23 0614 05/18/23 0734  BP: 122/88 123/66 122/69 116/86  Pulse: 85 82 79 80  Resp: 18 20 18 18   Temp: 98.9 F (37.2 C) 98.1 F (36.7 C) 98.1 F (36.7 C) 97.7 F (36.5 C)  TempSrc: Oral Oral Oral Oral  SpO2: 92% 92% 94% 98%  Weight:      Height:       No intake or output data in the 24 hours ending 05/18/23 0759    05/17/2023   12:03 PM 05/17/2023   11:14 AM 12/27/2022    1:43 PM  Last 3 Weights  Weight (lbs) 278 lb 4.8 oz 280 lb 6.4 oz 280 lb  Weight (kg) 126.236 kg 127.189 kg 127.007 kg      Telemetry    AFib 80's, infrequent PVCs, care couplet - Personally Reviewed  ECG    AFib 89bp, RBBB ( ), QTc manually measured , QTc given RBBB is good, - Personally Reviewed  Physical Exam   GEN: No acute distress.   Neck: No JVD Cardiac: irreg-irreg, no murmurs, rubs, or gallops.  Respiratory: CTA b/l. GI: Soft, nontender, non-distended  MS: No edema; No deformity. Neuro:  Nonfocal  Psych: Normal affect   Labs    High Sensitivity Troponin:  No results for input(s): "TROPONINIHS" in the last 720 hours.   Chemistry Recent Labs  Lab 05/17/23 1151 05/18/23 0515  NA 139 142  K 4.4 3.9  CL 106 108  CO2 25 27  GLUCOSE 97 104*  BUN 12 12  CREATININE 0.95 0.98  CALCIUM 9.3 9.0  MG 2.2 2.1   GFRNONAA >60 >60  ANIONGAP 8 7    Lipids No results for input(s): "CHOL", "TRIG", "HDL", "LABVLDL", "LDLCALC", "CHOLHDL" in the last 168 hours.  HematologyNo results for input(s): "WBC", "RBC", "HGB", "HCT", "MCV", "MCH", "MCHC", "RDW", "PLT" in the last 168 hours. Thyroid No results for input(s): "TSH", "FREET4" in the last 168 hours.  BNPNo results for input(s): "BNP", "PROBNP" in the last 168 hours.  DDimer No results for input(s): "DDIMER" in the last 168 hours.   Radiology    No results found.  Cardiac Studies   11/25/2015: TTE Study Conclusions  - Left ventricle: Systolic function was normal. The estimated    ejection fraction was in the range of 50% to 55%. The study is    not technically sufficient to allow evaluation of LV diastolic    function.  - Left atrium: The appendage was moderately dilated.  - Atrial septum: No defect or patent foramen ovale was identified.   Patient Profile     67 y.o. female w/PMHx of morbid  obesity, HTN, OSA (w/CPAP), AFib admitted for Tikosyn load  Assessment & Plan    Persistent  AFib CHA2DS2Vasc is 3, on Eliquis, appropriately dosed Tikosyn load is in progress K+ 3.9 Mag 2.1 Creat 0.98 (stable) QTc is stable/acceptable given broad QRS/RBBB  DCCV tomorrow if not in SR, pt is aware and agreeable Informed Consent   Shared Decision Making/Informed Consent The risks (stroke, cardiac arrhythmias rarely resulting in the need for a temporary or permanent pacemaker, skin irritation or burns and complications associated with conscious sedation including aspiration, arrhythmia, respiratory failure and death), benefits (restoration of normal sinus rhythm) and alternatives of a direct current cardioversion were explained in detail to Deborah [name] and she agrees to proceed.     HTN Home meds  OSA HS CPAP    For questions or updates, please contact [facility] Please consult [email or web address] for contact info under         Signed, Sheilah Pigeon, PA-C  05/18/2023, 7:59 AM    I have seen and examined this patient with Francis Dowse.  Agree with above, note added to reflect my findings.  Feeling well without complaint.  GEN: Well nourished, well developed, in no acute distress  HEENT: normal  Neck: no JVD, carotid bruits, or masses Cardiac: Irregular; no murmurs, rubs, or gallops,no edema  Respiratory:  clear to auscultation bilaterally, normal work of breathing GI: soft, nontender, nondistended, + BS MS: no deformity or atrophy  Skin: warm and dry Neuro:  Strength and sensation are intact Psych: euthymic mood, full affect   Persistent atrial fibrillation: Patient has had multiple ablations and cardioversions.  She remains in atrial fibrillation.  Currently on dofetilide load.  QTc is remained stable.  Deborah [name] continue to load with likely cardioversion tomorrow.  Deborah Vandyne M. Goldie Dimmer MD 05/18/2023 10:17 AM

## 2023-05-19 ENCOUNTER — Encounter (HOSPITAL_COMMUNITY)
Admission: EM | Disposition: A | Discharge status: Home / Self Care | Payer: Self-pay | Source: Ambulatory Visit | Attending: Cardiology

## 2023-05-19 ENCOUNTER — Ambulatory Visit (HOSPITAL_COMMUNITY): Admission: RE | Admit: 2023-05-19 | Payer: PPO | Source: Home / Self Care | Admitting: Cardiology

## 2023-05-19 DIAGNOSIS — I4819 Other persistent atrial fibrillation: Secondary | ICD-10-CM | POA: Diagnosis not present

## 2023-05-19 LAB — BASIC METABOLIC PANEL
Anion gap: 7 (ref 5–15)
BUN: 16 mg/dL (ref 8–23)
CO2: 27 mmol/L (ref 22–32)
Calcium: 9.1 mg/dL (ref 8.9–10.3)
Chloride: 107 mmol/L (ref 98–111)
Creatinine, Ser: 1.09 mg/dL — ABNORMAL HIGH (ref 0.44–1.00)
GFR, Estimated: 55 mL/min — ABNORMAL LOW (ref >60)
Glucose, Bld: 97 mg/dL (ref 70–99)
Potassium: 4.5 mmol/L (ref 3.5–5.1)
Sodium: 141 mmol/L (ref 135–145)

## 2023-05-19 LAB — CBC
HCT: 38.7 % (ref 36.0–46.0)
Hemoglobin: 12.2 g/dL (ref 12.0–15.0)
MCH: 27.5 pg (ref 26.0–34.0)
MCHC: 31.5 g/dL (ref 30.0–36.0)
MCV: 87.4 fL (ref 80.0–100.0)
Platelets: 224 10*3/uL (ref 150–400)
RBC: 4.43 MIL/uL (ref 3.87–5.11)
RDW: 15.5 % (ref 11.5–15.5)
WBC: 6 10*3/uL (ref 4.0–10.5)
nRBC: 0 % (ref 0.0–0.2)

## 2023-05-19 LAB — MAGNESIUM: Magnesium: 2.2 mg/dL (ref 1.7–2.4)

## 2023-05-19 SURGERY — CARDIOVERSION (CATH LAB)
Anesthesia: General

## 2023-05-19 MED ORDER — DOCUSATE SODIUM 100 MG PO CAPS
100.0000 mg | ORAL_CAPSULE | Freq: Two times a day (BID) | ORAL | Status: DC | PRN
  Administered 2023-05-19: 100 mg via ORAL
  Filled 2023-05-19: qty 1

## 2023-05-19 NOTE — Plan of Care (Signed)

## 2023-05-19 NOTE — Progress Notes (Signed)
Pharmacy: Dofetilide (Tikosyn) - Follow Up Assessment and Electrolyte Replacement  Pharmacy consulted to assist in monitoring and replacing electrolytes in this 67 y.o. female admitted on 05/17/2023 undergoing dofetilide initiation. First dofetilide dose: 05/17/23  Labs:    Component Value Date/Time   K 4.5 05/19/2023 0442   MG 2.2 05/19/2023 0442     Plan: Potassium: K >4: No additional supplementation needed  Magnesium: Mg > 2: No additional supplementation needed  Thank you for allowing pharmacy to participate in this patient's care   Wilmer Floor, PharmD PGY2 Cardiology Pharmacy Resident

## 2023-05-19 NOTE — Progress Notes (Addendum)
Rounding Note    Patient Name: Deborah [name] Date of Encounter: 05/19/2023  [facility] Cardiologist: Dr. Elberta Fortis  Subjective   Feels well, happy to hear she is in normal rhythm  Inpatient Medications    Scheduled Meds:  apixaban  5 mg Oral BID   buPROPion  150 mg Oral Daily   cholecalciferol  5,000 Units Oral Daily   cyanocobalamin  1,000 mcg Oral Daily   dofetilide  500 mcg Oral BID   metoprolol succinate  50 mg Oral Daily   sodium chloride flush  3 mL Intravenous Q12H   Continuous Infusions:  sodium chloride     PRN Meds: acetaminophen, ALPRAZolam, docusate sodium, polyvinyl alcohol, sodium chloride flush   Vital Signs    Vitals:   05/18/23 1234 05/18/23 1536 05/18/23 1951 05/19/23 0410  BP: 130/75 128/72 126/77 123/69  Pulse: 66 72 61 (!) 56  Resp: 18 18 18 18   Temp: 98.5 F (36.9 C) 98.1 F (36.7 C) 98.2 F (36.8 C) 97.8 F (36.6 C)  TempSrc: Oral Oral Oral Oral  SpO2: 93% 98% 98% 94%  Weight:      Height:       No intake or output data in the 24 hours ending 05/19/23 0723    05/17/2023   12:03 PM 05/17/2023   11:14 AM 12/27/2022    1:43 PM  Last 3 Weights  Weight (lbs) 278 lb 4.8 oz 280 lb 6.4 oz 280 lb  Weight (kg) 126.236 kg 127.189 kg 127.007 kg      Telemetry    AFib >> SR 50's, rare PVCs - Personally Reviewed  ECG    SB 59bpm, RBBB, QTc - Personally Reviewed w/Dr. Elberta Fortis  Physical Exam   GEN: No acute distress.   Neck: No JVD Cardiac: RRR, no murmurs, rubs, or gallops.  Respiratory: CTA b/l. GI: Soft, nontender, non-distended  MS: No edema; No deformity. Neuro:  Nonfocal  Psych: Normal affect   Labs    High Sensitivity Troponin:  No results for input(s): "TROPONINIHS" in the last 720 hours.   Chemistry Recent Labs  Lab 05/17/23 1151 05/18/23 0515 05/19/23 0442  NA 139 142 141  K 4.4 3.9 4.5  CL 106 108 107  CO2 25 27 27   GLUCOSE 97 104* 97  BUN 12 12 16   CREATININE 0.95 0.98 1.09*  CALCIUM 9.3  9.0 9.1  MG 2.2 2.1 2.2  GFRNONAA >60 >60 55*  ANIONGAP 8 7 7     Lipids No results for input(s): "CHOL", "TRIG", "HDL", "LABVLDL", "LDLCALC", "CHOLHDL" in the last 168 hours.  Hematology Recent Labs  Lab 05/19/23 0442  WBC 6.0  RBC 4.43  HGB 12.2  HCT 38.7  MCV 87.4  MCH 27.5  MCHC 31.5  RDW 15.5  PLT 224   Thyroid No results for input(s): "TSH", "FREET4" in the last 168 hours.  BNPNo results for input(s): "BNP", "PROBNP" in the last 168 hours.  DDimer No results for input(s): "DDIMER" in the last 168 hours.   Radiology    No results found.  Cardiac Studies   ECHO 11/25/2015: Study Conclusions  - Left ventricle: Systolic function was normal. The estimated    ejection fraction was in the range of 50% to 55%. The study is    not technically sufficient to allow evaluation of LV diastolic    function.  - Left atrium: The appendage was moderately dilated.  - Atrial septum: No defect or patent foramen ovale was identified.  Patient Profile     67 y.o. female w/PMHx of HTN, OSA (w/CPAP), AFib (w/prior PVI ablations) admitted for Tikosyn load  (Previously failed propafenone)  Assessment & Plan    Persistent AFib CHA2DS2Vasc is 3, on Eliquis, appropriately dosed Tikosyn load is in progress K+ 4.3 Mag 2.2 Creat 1.09 QTc stable  Converted with drug, diet resumed Anticipate d/c tomorrow  HTN Home meds  OSA CPAP at HS    For questions or updates, please contact [facility] Please consult [email or web address] for contact info under        Signed, Deborah Pigeon, PA-C  05/19/2023, 7:23 AM    I have seen and examined this patient with Deborah [name].  Agree with above, note added to reflect my findings.  Feeling well without complaint.  GEN: Well nourished, well developed, in no acute distress  HEENT: normal  Neck: no JVD, carotid bruits, or masses Cardiac: RRR; no murmurs, rubs, or gallops,no edema  Respiratory:  clear to auscultation bilaterally,  normal work of breathing GI: soft, nontender, nondistended, + BS MS: no deformity or atrophy  Skin: warm and dry Neuro:  Strength and sensation are intact Psych: euthymic mood, full affect   Persistent atrial fibrillation: undergoing tikosyn load. QTc stable. Has converted to sinus rhythm. Deborah [name] continue at current dose. Cancel DCCV. Plan likely home tomorrow.   Deborah Aydelotte M. Wilbern Pennypacker MD 05/19/2023 7:42 AM

## 2023-05-19 NOTE — Progress Notes (Signed)
Post dose EKG is reviewed QTc stable, accounting for QRS duration QTc Continue Tikosyn  Metoprolol dose held today 2/2 bradycardia, sinus rates may improve, follow in the morning for betablocker decision  Francis Dowse, [facility]

## 2023-05-20 ENCOUNTER — Other Ambulatory Visit (HOSPITAL_COMMUNITY): Payer: Self-pay

## 2023-05-20 DIAGNOSIS — I4819 Other persistent atrial fibrillation: Secondary | ICD-10-CM | POA: Diagnosis not present

## 2023-05-20 LAB — BASIC METABOLIC PANEL
Anion gap: 7 (ref 5–15)
BUN: 17 mg/dL (ref 8–23)
CO2: 26 mmol/L (ref 22–32)
Calcium: 9.4 mg/dL (ref 8.9–10.3)
Chloride: 107 mmol/L (ref 98–111)
Creatinine, Ser: 1.13 mg/dL — ABNORMAL HIGH (ref 0.44–1.00)
GFR, Estimated: 53 mL/min — ABNORMAL LOW (ref >60)
Glucose, Bld: 103 mg/dL — ABNORMAL HIGH (ref 70–99)
Potassium: 4.1 mmol/L (ref 3.5–5.1)
Sodium: 140 mmol/L (ref 135–145)

## 2023-05-20 LAB — CBC
HCT: 44 % (ref 36.0–46.0)
Hemoglobin: 13.5 g/dL (ref 12.0–15.0)
MCH: 26.7 pg (ref 26.0–34.0)
MCHC: 30.7 g/dL (ref 30.0–36.0)
MCV: 87.1 fL (ref 80.0–100.0)
Platelets: 264 10*3/uL (ref 150–400)
RBC: 5.05 MIL/uL (ref 3.87–5.11)
RDW: 15.7 % — ABNORMAL HIGH (ref 11.5–15.5)
WBC: 7.5 10*3/uL (ref 4.0–10.5)
nRBC: 0 % (ref 0.0–0.2)

## 2023-05-20 LAB — MAGNESIUM: Magnesium: 2.3 mg/dL (ref 1.7–2.4)

## 2023-05-20 MED ORDER — DOFETILIDE 500 MCG PO CAPS
500.0000 ug | ORAL_CAPSULE | Freq: Two times a day (BID) | ORAL | 1 refills | Status: DC
  Filled 2023-05-20: qty 60, 30d supply, fill #0

## 2023-05-20 NOTE — Progress Notes (Signed)
Pharmacy: Dofetilide (Tikosyn) - Follow Up Assessment and Electrolyte Replacement  Pharmacy consulted to assist in monitoring and replacing electrolytes in this 67 y.o. female admitted on 05/17/2023 undergoing dofetilide initiation. First dofetilide dose: 05/17/23  Labs:    Component Value Date/Time   K 4.1 05/20/2023 0545   MG 2.3 05/20/2023 0545     Plan: Potassium: K >/= 4: No additional supplementation needed  Magnesium: Mg > 2: No additional supplementation needed   As patient has required on average 5 mEq of potassium replacement every day, we do not recommend discharging patient with additional potassium replacement.  Thank you for allowing pharmacy to participate in this patient's care   Wilmer Floor, PharmD PGY2 Cardiology Pharmacy Resident  05/20/2023  11:13 AM

## 2023-05-20 NOTE — Progress Notes (Signed)
Post dose EKG reviewed with Dr. Elberta Fortis, he has seen the patient,  QTc stable, anticipate discharge this afternoon  Francis Dowse, PA-C

## 2023-05-20 NOTE — Progress Notes (Signed)
Pt received d/c instructions from [employer], and copy of instructions given to pt. [facility] filling script for pt, pharmacy states it will be approximately 30 minutes for script to be filled. Pt's ride on his way and will be here shortly.   Annice Needy, RN SWOT

## 2023-05-20 NOTE — Discharge Summary (Cosign Needed)
ELECTROPHYSIOLOGY PROCEDURE DISCHARGE SUMMARY    Patient ID: Deborah [name],  MRN: [id number], DOB/AGE: [birth year] 67 y.o.  Admit date: 05/17/2023 Discharge date: 05/20/2023  Primary Care Physician: Blair Heys, MD (Inactive) Electrophysiologist: Dr. Elberta Fortis  Primary Discharge Diagnosis:  1.  persistent atrial fibrillation status post Tikosyn loading this admission      CHA2DS2Vasc is 3, on eliquis  Secondary Discharge Diagnosis:  HTN OSA  Allergies  Allergen Reactions   Benazepril Cough   Adhesive [Tape] Rash and Other (See Comments)    bandaides   Sulfa Antibiotics Rash     Procedures This Admission:  1.  Tikosyn loading   Brief HPI: Deborah [name] is a 67 y.o. female with a past medical history as noted above.  She is followed by EP in the outpatient setting for treatment options of atrial fibrillation.  Risks, benefits, and alternatives to Tikosyn were reviewed with the patient who wished to proceed.    Hospital Course:  The patient was admitted and Tikosyn was initiated.  Renal function and electrolytes were followed during the hospitalization.  The patient's QTc remained stable.  She converted with drug and did not require DCCV, monitored on telemetry throughout her stay> post conversion with SB/SR 50's mostly -60's and her home metoprolol stopped.  On the day of discharge, she feels well, was examined by Dr Elberta Fortis who considered the patient stable for discharge to home.  Follow-up has been arranged with the AFib clinic in 1 week and with the EP team in 4 weeks.   Tikosyn teaching was completed No  new or electrolyte replacement for home   Physical Exam: Vitals:   05/19/23 1941 05/20/23 0542 05/20/23 0715 05/20/23 1145  BP: 133/79 135/74 136/83 137/84  Pulse: 60 60 65   Resp: 20 20 16 15   Temp: 98 F (36.7 C) 98.2 F (36.8 C) 98.3 F (36.8 C) 98.3 F (36.8 C)  TempSrc: Oral Oral Oral Oral  SpO2: 100% 99% 95% 100%  Weight:      Height:          GEN- The patient is well appearing, alert and oriented x 3 today.   HEENT: normocephalic, atraumatic; sclera clear, conjunctiva pink; hearing intact; oropharynx clear; neck supple, no JVP Lymph- no cervical lymphadenopathy Lungs- CTA b/l, normal work of breathing.  No wheezes, rales, rhonchi Heart- RRR, no murmurs, rubs or gallops, PMI not laterally displaced GI- soft, non-tender, non-distended Extremities- no clubbing, cyanosis, or edema MS- no significant deformity or atrophy Skin- warm and dry, no rash or lesion Psych- euthymic mood, full affect Neuro- strength and sensation are intact   Labs:   Lab Results  Component Value Date   WBC 7.5 05/20/2023   HGB 13.5 05/20/2023   HCT 44.0 05/20/2023   MCV 87.1 05/20/2023   PLT 264 05/20/2023    Recent Labs  Lab 05/20/23 0545  NA 140  K 4.1  CL 107  CO2 26  BUN 17  CREATININE 1.13*  CALCIUM 9.4  GLUCOSE 103*     Discharge Medications:  Allergies as of 05/20/2023       Reactions   Benazepril Cough   Adhesive [tape] Rash, Other (See Comments)   bandaides   Sulfa Antibiotics Rash        Medication List     STOP taking these medications    FLUoxetine 10 MG capsule Commonly known as: PROZAC   metoprolol succinate 50 MG 24 hr tablet Commonly known as: TOPROL-XL  propafenone 325 MG 12 hr capsule Commonly known as: RYTHMOL SR       TAKE these medications    acetaminophen 500 MG tablet Commonly known as: TYLENOL Take 1,000 mg by mouth every 6 (six) hours as needed for moderate pain or headache.   buPROPion 150 MG 24 hr tablet Commonly known as: WELLBUTRIN XL Take 150 mg by mouth daily.   cyanocobalamin 1000 MCG tablet Commonly known as: VITAMIN B12 Take 1,000 mcg by mouth daily.   dofetilide 500 MCG capsule Commonly known as: TIKOSYN Take 1 capsule (500 mcg total) by mouth 2 (two) times daily.   Eliquis 5 MG Tabs tablet Generic drug: apixaban TAKE 1 TABLET BY MOUTH TWICE A DAY   ICAPS  AREDS FORMULA PO Take 1 tablet by mouth in the morning and at bedtime.   Systane 0.4-0.3 % Soln Generic drug: Polyethyl Glycol-Propyl Glycol Place 3 drops into both eyes as needed (dry/irritated eyes.).   Vitamin D3 125 MCG (5000 UT) Tabs Take 5,000 Units by mouth daily.   Voltaren 1 % Gel Generic drug: diclofenac Sodium Apply 1 Application topically as needed (knee pain).   Xanax 0.5 MG tablet Generic drug: ALPRAZolam Take 0.25 mg by mouth as needed for anxiety.        Disposition:  Discharge Instructions     Diet - low sodium heart healthy   Complete by: As directed    Increase activity slowly   Complete by: As directed         Duration of Discharge Encounter: Greater than 30 minutes including physician time.  Norma Fredrickson, PA-C 05/20/2023 12:38 PM  I have seen and examined this patient with Francis Dowse.  Agree with above, note added to reflect my findings.  Patient admitted to the hospital for dofetilide load.  She was admitted in atrial fibrillation.  She converted to sinus rhythm chemically.  She remained in sinus rhythm with stable QTc.  Plan for discharge today with follow-up in clinic.  GEN: Well nourished, well developed, in no acute distress  HEENT: normal  Neck: no JVD, carotid bruits, or masses Cardiac: RRR; no murmurs, rubs, or gallops,no edema  Respiratory:  clear to auscultation bilaterally, normal work of breathing GI: soft, nontender, nondistended, + BS MS: no deformity or atrophy  Skin: warm and dry Neuro:  Strength and sensation are intact Psych: euthymic mood, full affect    Will M. Camnitz MD 05/25/2023 7:34 AM

## 2023-05-20 NOTE — Plan of Care (Signed)
  Problem: Education: Goal: Knowledge of General Education information will improve Description: Including pain rating scale, medication(s)/side effects and non-pharmacologic comfort measures Outcome: Progressing   Problem: Clinical Measurements: Goal: Ability to maintain clinical measurements within normal limits will improve Outcome: Progressing Goal: Will remain free from infection Outcome: Progressing Goal: Diagnostic test results will improve Outcome: Progressing Goal: Respiratory complications will improve Outcome: Progressing Goal: Cardiovascular complication will be avoided Outcome: Progressing   Problem: Activity: Goal: Risk for activity intolerance will decrease Outcome: Progressing   Problem: Coping: Goal: Level of anxiety will decrease Outcome: Progressing   Problem: Safety: Goal: Ability to remain free from injury will improve Outcome: Progressing   Problem: Skin Integrity: Goal: Risk for impaired skin integrity will decrease Outcome: Progressing   Problem: Education: Goal: Knowledge of disease or condition will improve Outcome: Progressing Goal: Understanding of medication regimen will improve Outcome: Progressing Goal: Individualized Educational Video(s) Outcome: Progressing   Problem: Activity: Goal: Ability to tolerate increased activity will improve Outcome: Progressing   Problem: Cardiac: Goal: Ability to achieve and maintain adequate cardiopulmonary perfusion will improve Outcome: Progressing   Problem: Health Behavior/Discharge Planning: Goal: Ability to safely manage health-related needs after discharge will improve Outcome: Progressing

## 2023-05-20 NOTE — Care Management (Deleted)
  Transition of Care [facility]) Screening Note   Patient Details  Name: Deborah [name] Date of Birth: 15-Feb-1956   Transition of [facility]) CM/SW Contact:    Lockie Pares, RN Phone Number: 05/20/2023, 9:19 AM    Transition of Care Department [facility]) has reviewed patient and no TOC needs have been identified at this time. We will continue to monitor patient advancement through interdisciplinary progression rounds. If new patient transition needs arise, please place a TOC consult.

## 2023-05-23 DIAGNOSIS — G4733 Obstructive sleep apnea (adult) (pediatric): Secondary | ICD-10-CM | POA: Diagnosis not present

## 2023-05-25 DIAGNOSIS — G4733 Obstructive sleep apnea (adult) (pediatric): Secondary | ICD-10-CM | POA: Diagnosis not present

## 2023-05-30 ENCOUNTER — Ambulatory Visit (HOSPITAL_COMMUNITY)
Admit: 2023-05-30 | Discharge: 2023-05-30 | Disposition: A | Discharge status: Home / Self Care | Payer: PPO | Source: Ambulatory Visit | Attending: Internal Medicine | Admitting: Internal Medicine

## 2023-05-30 VITALS — BP 150/92 | HR 71 | Ht 73.0 in | Wt 275.0 lb

## 2023-05-30 DIAGNOSIS — I4891 Unspecified atrial fibrillation: Secondary | ICD-10-CM

## 2023-05-30 DIAGNOSIS — Z7901 Long term (current) use of anticoagulants: Secondary | ICD-10-CM | POA: Insufficient documentation

## 2023-05-30 DIAGNOSIS — I4819 Other persistent atrial fibrillation: Secondary | ICD-10-CM | POA: Diagnosis not present

## 2023-05-30 DIAGNOSIS — Z5181 Encounter for therapeutic drug level monitoring: Secondary | ICD-10-CM

## 2023-05-30 DIAGNOSIS — R9431 Abnormal electrocardiogram [ECG] [EKG]: Secondary | ICD-10-CM | POA: Diagnosis not present

## 2023-05-30 DIAGNOSIS — G4733 Obstructive sleep apnea (adult) (pediatric): Secondary | ICD-10-CM | POA: Diagnosis not present

## 2023-05-30 DIAGNOSIS — Z79899 Other long term (current) drug therapy: Secondary | ICD-10-CM | POA: Diagnosis not present

## 2023-05-30 LAB — MAGNESIUM: Magnesium: 2.5 mg/dL — ABNORMAL HIGH (ref 1.7–2.4)

## 2023-05-30 LAB — BASIC METABOLIC PANEL
Anion gap: 8 (ref 5–15)
BUN: 13 mg/dL (ref 8–23)
CO2: 26 mmol/L (ref 22–32)
Calcium: 9.7 mg/dL (ref 8.9–10.3)
Chloride: 107 mmol/L (ref 98–111)
Creatinine, Ser: 1.23 mg/dL — ABNORMAL HIGH (ref 0.44–1.00)
GFR, Estimated: 48 mL/min — ABNORMAL LOW (ref >60)
Glucose, Bld: 99 mg/dL (ref 70–99)
Potassium: 4.5 mmol/L (ref 3.5–5.1)
Sodium: 141 mmol/L (ref 135–145)

## 2023-05-30 MED ORDER — DOFETILIDE 500 MCG PO CAPS: 500.0000 ug | ORAL_CAPSULE | Freq: Two times a day (BID) | ORAL | 4 refills | Status: DC

## 2023-05-30 NOTE — Progress Notes (Signed)
Primary Care Physician: Blair Heys, MD (Inactive) Referring Physician: Dr. Rogene Houston Deborah [name] is a 67 y.o. female with a h/o persistent afib that is in the afib clinic for f/u on    ablation one month ago.  She has had 2 ablations in the past and several cardioversion's in the past. She remains on  Propafenone  325 mg bid.  She reports that she has not noted any afib since the procedure. She feels well. She is surprised to find out she is in afib at 84 bpm.  No swallowing or groin issues since the procedure. She  did have a fall yesterday, fell backward from a squatting position,  and is questioning if that could have thrown her out of rhythm.   F/u in afib clinic, 11/17. Ekg shows that pt continues with afib . She has noted taking her BP/HR issues since last visit,  that the pulse indicator light has been showing an irregular pulse so I feel that she is in persistent afib. I discussed a cardioversion and she is in agreement. No missed anticoagulation.   F/u successful cardioversion, 06/03/20. She continues in SR, she feels improved. No concerns voiced today.  Follow up in the AF clinic 09/01/20. Patient continued to have atrial fibrillation post repeat ablation and propafenone was resumed. Patient converted to SR with the medication. She is feeling well today. She denies any bleeding issues on anticoagulation.   Follow up in the AF clinic 02/26/22. She reports that she has been in afib for about two weeks with symptoms of fatigue and SOB with exertion. There were no specific triggers that she could identify. She called Dr Elberta Fortis office who recommended dofetilide admission.   Follow up in the AF clinic 03/15/22. Patient is s/p DCCV on 03/05/22. She feels well today and is in SR. No bleeding issues on anticoagulation.   Follow up in the AF clinic 09/15/22. Patient reports that she has done well since her last visit and has not had any afib episodes. No bleeding issues on anticoagulation.    On follow up 12/27/22, patient is currently in rate controlled Afib. Patient sent Kardiamobile strip to Dr. Elberta Fortis on 6/27 which showed Afib. Dr. Elberta Fortis recommended to stop rhythmol and consider other AAD therapy. She is currently on lexapro 10 mg daily.   Follow up in the AF clinic 05/17/23. Patient presents for dofetilide admission. She remains in afib today with symptoms of fatigue. She denies any missed doses of anticoagulation in the past 3 weeks.   On follow up 05/30/23, she is currently in NSR. S/p Tikosyn admission 11/19-22/24. She chemically converted to NSR and did not require cardioversion. Qtc remained stable and discharged on Tikosyn 500 mcg BID. Metoprolol stopped due to bradycardia. She has had no episodes of Afib since hospital discharge. She does not check her BP at home. No missed doses of Tikosyn or Eliquis.   Today, she denies symptoms of palpitations, chest pain, orthopnea, PND, lower extremity edema, dizziness, presyncope, syncope, or neurologic sequela. The patient is tolerating medications without difficulties and is otherwise without complaint today.   Past Medical History:  Diagnosis Date   A-fib (HCC)    Anemia    hx    Anxiety    Arthritis    "was in right knee" (01/25/2018)   Dysrhythmia    atrial fib/flutter   GERD (gastroesophageal reflux disease)    occ   HTN (hypertension)    Migraine    "used to  have them more frequently before menopause; now only have a couple/yr" (01/25/2018)   Obesity    OSA on CPAP    PAF (paroxysmal atrial fibrillation) (HCC)    s/p DCCV 2010   Peripheral vascular disease (HCC) 2016   after flying in leg    Current Outpatient Medications  Medication Sig Dispense Refill   acetaminophen (TYLENOL) 500 MG tablet Take 1,000 mg by mouth as needed for moderate pain (pain score 4-6) or headache.     ALPRAZolam (XANAX) 0.5 MG tablet Take 0.25 mg by mouth as needed for anxiety.     buPROPion (WELLBUTRIN XL) 150 MG 24 hr tablet Take 150  mg by mouth daily.     Cholecalciferol (VITAMIN D3) 125 MCG (5000 UT) TABS Take 5,000 Units by mouth daily.      diclofenac Sodium (VOLTAREN) 1 % GEL Apply 1 Application topically as needed (knee pain).     dofetilide (TIKOSYN) 500 MCG capsule Take 1 capsule (500 mcg total) by mouth 2 (two) times daily. 60 capsule 1   ELIQUIS 5 MG TABS tablet TAKE 1 TABLET BY MOUTH TWICE A DAY 60 tablet 11   Multiple Vitamins-Minerals (ICAPS AREDS FORMULA PO) Take 1 tablet by mouth in the morning and at bedtime.     Polyethyl Glycol-Propyl Glycol (SYSTANE) 0.4-0.3 % SOLN Place 3 drops into both eyes as needed (dry/irritated eyes.).     vitamin B-12 (CYANOCOBALAMIN) 1000 MCG tablet Take 1,000 mcg by mouth daily.     No current facility-administered medications for this encounter.    ROS- All systems are reviewed and negative except as per the HPI above  Physical Exam: Vitals:   05/30/23 1543  BP: (!) 150/92  Pulse: 71  Weight: 124.7 kg  Height: 6\' 1"  (1.854 m)      Wt Readings from Last 3 Encounters:  05/30/23 124.7 kg  05/17/23 126.2 kg  05/17/23 127.2 kg    GEN- The patient is well appearing, alert and oriented x 3 today.   Neck - no JVD or carotid bruit noted Lungs- Clear to ausculation bilaterally, normal work of breathing Heart- Regular rate and rhythm, no murmurs, rubs or gallops, PMI not laterally displaced Extremities- no clubbing, cyanosis, or edema Skin - no rash or ecchymosis noted   EKG today demonstrates Vent. rate 71 BPM PR interval 154 ms QRS duration 154 ms QT/QTcB 474/515 ms P-R-T axes 48 79 22 Normal sinus rhythm Right bundle branch block Abnormal ECG When compared with ECG of 20-May-2023 12:06, PREVIOUS ECG IS PRESENT   ECHO 11/25/2015: Study Conclusions  - Left ventricle: Systolic function was normal. The estimated    ejection fraction was in the range of 50% to 55%. The study is    not technically sufficient to allow evaluation of LV diastolic    function.   - Left atrium: The appendage was moderately dilated.  - Atrial septum: No defect or patent foramen ovale was identified.    Epic records reviewed   CHA2DS2-VASc Score = 2  The patient's score is based upon: CHF History: 0 HTN History: 0 Diabetes History: 0 Stroke History: 0 Vascular Disease History: 0 Age Score: 1 Gender Score: 1      ASSESSMENT AND PLAN: Persistent Atrial Fibrillation (ICD10:  I48.19) The patient's CHA2DS2-VASc score is 2, indicating a 2.2% annual risk of stroke.   S/p ablation x 3, most recent 04/11/20 Previously failed propafenone S/p Tikosyn admission 11/19-22/24.  She is currently in NSR.  Qtc stable. Interval 479 ms  corrected for RBBB. Continue Tikosyn 500 mcg BID. Bmet and mag drawn today. Continue Eliquis 5 mg BID.   BP elevated today; advised patient to check BP at home and call if readings are sustained 150/90s to restart metoprolol.    OSA  Encouraged nightly CPAP   Follow up in 1 month for Tikosyn surveillance as scheduled, then 3 months with me.    Justin Mend, PA-C Afib Clinic [facility] [address] [place], [place] [address] [phone]

## 2023-06-17 ENCOUNTER — Ambulatory Visit: Payer: PPO | Admitting: Student

## 2023-06-19 NOTE — Progress Notes (Unsigned)
Electrophysiology Office Note:   Date:  06/20/2023  ID:  SELVA NOONER, DOB [birth year], MRN [id number]  Primary Cardiologist: None Primary Heart Failure: None Electrophysiologist: Will Jorja Loa, MD      History of Present Illness:   Deborah [name] is a 67 y.o. female with h/o persistent AF, HTN, OSA seen today for routine electrophysiology followup.   Recent admission 04/2023 for Tikosyn loading. Home metoprolol was stopped during that admit due to bradycardia. She converted on medication / did not require DCCV.   Since last being seen in our clinic the patient reports she has been doing well. Notes she has a house full of Christmas guests and she is exhausted.  She went to bed last night and her alarm went off through [employer] and her family did not tell her & she missed her evening dose of Tikosyn / Eliquis. She occasionally will notice a palpitation but nothing consistent or bothersome.  Reports her blood pressure has been elevated at home.    She denies chest pain, palpitations, dyspnea, PND, orthopnea, nausea, vomiting, dizziness, syncope, edema, weight gain, or early satiety.   Review of systems complete and found to be negative unless listed in HPI.   EP Information / Studies Reviewed:    EKG is ordered today. Personal review as below.  EKG Interpretation Date/Time:  Monday June 20 2023 10:30:58 EST Ventricular Rate:  80 PR Interval:  158 QRS Duration:  148 QT Interval:  436 QTC Calculation: 502 R Axis:   70  Text Interpretation: Sinus rhythm with occasional Premature ventricular complexes Right bundle branch block Confirmed by Canary Brim ([id number]) on 06/20/2023 11:13:55 AM   Studies:  ECHO 10/2015 > LVEF 50-55%   Arrhythmia / AAD Atrial Fibrillation  AF Ablation 12/04/2015, 01/25/2018, 04/11/2020 DCCV 06/03/20  Propafenone > failed  DCCV 03/05/2022  Tikosyn > initiated 04/2023  Risk Assessment/Calculations:    CHA2DS2-VASc Score = 2   This indicates a 2.2%  annual risk of stroke. The patient's score is based upon: CHF History: 0 HTN History: 0 Diabetes History: 0 Stroke History: 0 Vascular Disease History: 0 Age Score: 1 Gender Score: 1     HYPERTENSION CONTROL Vitals:   06/20/23 1027 06/20/23 1116  BP: (!) 148/106 (!) 160/100    The patient's blood pressure is elevated above target today.  In order to address the patient's elevated BP: A new medication was prescribed today.; Blood pressure will be monitored at home to determine if medication changes need to be made.           Physical Exam:   VS:  BP (!) 160/100   Pulse 79   Resp 16   Ht 6\' 1"  (1.854 m)   Wt 276 lb (125.2 kg)   SpO2 96%   BMI 36.41 kg/m    Wt Readings from Last 3 Encounters:  06/20/23 276 lb (125.2 kg)  05/30/23 275 lb (124.7 kg)  05/17/23 278 lb 4.8 oz (126.2 kg)     GEN: Well nourished, well developed in no acute distress NECK: No JVD; No carotid bruits CARDIAC: Regular rate and rhythm, no murmurs, rubs, gallops RESPIRATORY:  Clear to auscultation without rales, wheezing or rhonchi  ABDOMEN: Soft, non-tender, non-distended EXTREMITIES:  No edema; No deformity   ASSESSMENT AND PLAN:    Persistent Atrial Fibrillation  High Risk Drug Monitoring  CHA2DS2-VASc 2, s/p ablation x3, failed propafenone.  -EKG with NSR (corrected for RBBB), stable QTc   -continue Tikosyn 500 mcg BID  -Tikosyn  labs > BMP, Mg+   -continue OAC for stroke prophylaxis   Secondary Hypercoagulable State  -continue Eliquis 5mg  BID, dose reviewed and appropriate by age/wt   Hypertension  -elevated at AF Clinic  -start losartan 50mg  daily (no drug-drug interaction with Tikosyn per Epocrates) -pt instructed to check BP for one week and send via MyChart after starting her losartan -hx of cough on ACE > discussed if she develops cough to let clinic know and would need to change agent  OSA -CPAP compliance encouarged, reviewed relationship of AF / OSA with patient     Follow up with EP APP in 3 months  Signed, Canary Brim, MSN, APRN, NP-C, AGACNP-BC [facility] - Electrophysiology  06/20/2023, 11:18 AM

## 2023-06-20 ENCOUNTER — Encounter: Payer: Self-pay | Admitting: Pulmonary Disease

## 2023-06-20 ENCOUNTER — Ambulatory Visit: Payer: PPO | Attending: Pulmonary Disease | Admitting: Pulmonary Disease

## 2023-06-20 VITALS — BP 160/100 | HR 79 | Resp 16 | Ht 73.0 in | Wt 276.0 lb

## 2023-06-20 DIAGNOSIS — I10 Essential (primary) hypertension: Secondary | ICD-10-CM

## 2023-06-20 DIAGNOSIS — Z79899 Other long term (current) drug therapy: Secondary | ICD-10-CM | POA: Diagnosis not present

## 2023-06-20 DIAGNOSIS — D6869 Other thrombophilia: Secondary | ICD-10-CM | POA: Diagnosis not present

## 2023-06-20 DIAGNOSIS — I4819 Other persistent atrial fibrillation: Secondary | ICD-10-CM

## 2023-06-20 DIAGNOSIS — G4733 Obstructive sleep apnea (adult) (pediatric): Secondary | ICD-10-CM | POA: Diagnosis not present

## 2023-06-20 MED ORDER — LOSARTAN POTASSIUM 50 MG PO TABS: 50.0000 mg | ORAL_TABLET | Freq: Every day | ORAL | 3 refills | Status: AC

## 2023-06-20 NOTE — Patient Instructions (Addendum)
Medication Instructions:  Start losartan 50 mg daily *If you need a refill on your cardiac medications before your next appointment, please call your pharmacy*  Lab Work: BMET, MAG-TODAY If you have labs (blood work) drawn today and your tests are completely normal, you will receive your results only by: MyChart Message (if you have MyChart) OR A paper copy in the mail If you have any lab test that is abnormal or we need to change your treatment, we will call you to review the results.  Follow-Up: At [facility], you and your health needs are our priority.  As part of our continuing mission to provide you with exceptional heart care, we have created designated Provider Care Teams.  These Care Teams include your primary Cardiologist (physician) and Advanced Practice Providers (APPs -  Physician Assistants and Nurse Practitioners) who all work together to provide you with the care you need, when you need it.  Your next appointment:   3 month(s)  Provider:   Canary Brim, NP    1.Check your blood pressure daily at home for 1 week and send [place] the readings through mychart at the end of the week. 2.Let us know if you have cough with the losartan.

## 2023-06-21 LAB — BASIC METABOLIC PANEL
BUN/Creatinine Ratio: 14 (ref 12–28)
BUN: 15 mg/dL (ref 8–27)
CO2: 26 mmol/L (ref 20–29)
Calcium: 9.6 mg/dL (ref 8.7–10.3)
Chloride: 105 mmol/L (ref 96–106)
Creatinine, Ser: 1.06 mg/dL — ABNORMAL HIGH (ref 0.57–1.00)
Glucose: 93 mg/dL (ref 70–99)
Potassium: 5 mmol/L (ref 3.5–5.2)
Sodium: 142 mmol/L (ref 134–144)
eGFR: 57 mL/min/{1.73_m2} — ABNORMAL LOW (ref >59)

## 2023-06-21 LAB — MAGNESIUM: Magnesium: 2.2 mg/dL (ref 1.6–2.3)

## 2023-06-27 DIAGNOSIS — Z01419 Encounter for gynecological examination (general) (routine) without abnormal findings: Secondary | ICD-10-CM | POA: Diagnosis not present

## 2023-06-27 DIAGNOSIS — Z1231 Encounter for screening mammogram for malignant neoplasm of breast: Secondary | ICD-10-CM | POA: Diagnosis not present

## 2023-06-27 DIAGNOSIS — Z1331 Encounter for screening for depression: Secondary | ICD-10-CM | POA: Diagnosis not present

## 2023-07-01 ENCOUNTER — Encounter: Payer: Self-pay | Admitting: Physician Assistant

## 2023-07-01 ENCOUNTER — Other Ambulatory Visit (INDEPENDENT_AMBULATORY_CARE_PROVIDER_SITE_OTHER): Payer: PPO

## 2023-07-01 ENCOUNTER — Ambulatory Visit (INDEPENDENT_AMBULATORY_CARE_PROVIDER_SITE_OTHER): Payer: PPO | Admitting: Physician Assistant

## 2023-07-01 DIAGNOSIS — M1712 Unilateral primary osteoarthritis, left knee: Secondary | ICD-10-CM

## 2023-07-01 MED ORDER — LIDOCAINE HCL 1 % IJ SOLN
2.0000 mL | INTRAMUSCULAR | Status: AC | PRN
  Administered 2023-07-01: 2 mL

## 2023-07-01 MED ORDER — METHYLPREDNISOLONE ACETATE 40 MG/ML IJ SUSP
40.0000 mg | INTRAMUSCULAR | Status: AC | PRN
  Administered 2023-07-01: 40 mg via INTRA_ARTICULAR

## 2023-07-01 MED ORDER — BUPIVACAINE HCL 0.25 % IJ SOLN
2.0000 mL | INTRAMUSCULAR | Status: AC | PRN
  Administered 2023-07-01: 2 mL via INTRA_ARTICULAR

## 2023-07-01 NOTE — Telephone Encounter (Signed)
 Deborah [name],   I checked the usual "knee injection" medications (steroid or lubricant agent) against Tikosyn and there are no interactions.  If it is something outside of those, we would need to look at them.  Thanks!   Merry Proud

## 2023-07-01 NOTE — Progress Notes (Addendum)
 Office Visit Note   Patient: Deborah [name]           Date of Birth: 1955-11-01           MRN: [id number] Visit Date: 07/01/2023              Requested by: No referring provider defined for this encounter. PCP: Hugh Charleston, MD (Inactive)   Assessment & Plan: Visit Diagnoses:  1. Unilateral primary osteoarthritis, left knee     Plan: Impression is left knee osteoarthritis.  We have discussed various treatment options to include intra-articular cortisone injection versus viscosupplementation injection versus total knee arthroplasty.  She would like to proceed with cortisone injection, however she is on a new heart medication and would like to check with her cardiologist first to make sure this is okay.SABRA  She will follow-up as needed.  Follow-Up Instructions: Return if symptoms worsen or fail to improve.   Orders:  Orders Placed This Encounter  Procedures   Large Joint Inj: L knee   XR KNEE 3 VIEW LEFT   Meds ordered this encounter  Medications   bupivacaine  (MARCAINE ) 0.25 % (with pres) injection 2 mL   lidocaine  (XYLOCAINE ) 1 % (with pres) injection 2 mL   methylPREDNISolone  acetate (DEPO-MEDROL ) injection 40 mg      Procedures: Large Joint Inj: L knee on 07/01/2023 10:37 AM Indications: pain Details: 22 G needle, anterolateral approach Medications: 2 mL lidocaine  1 %; 2 mL bupivacaine  0.25 %; 40 mg methylPREDNISolone  acetate 40 MG/ML      Clinical Data: No additional findings.   Subjective: Chief Complaint  Patient presents with   Left Knee - Pain    HPI patient is a very pleasant 68 year old female who comes in today with left knee pain.  Symptoms have been ongoing for decades but have worsened over the past few months.  She denies any injury or change in activity.  All of her pain is around to the anterior knee around her kneecap.  She denies any locking or catching.  Symptoms are aggravated with activity such as stair climbing.  She does get relief when she  is lying down.  She has been taking Tylenol  and using topical Voltaren without relief.  No previous cortisone injection to the left knee.  She is status post right total knee replacement.  Doing well but notes this was a hard and long recovery.  Review of Systems as detailed in HPI.  All others reviewed and are negative.   Objective: Vital Signs: There were no vitals taken for this visit.  Physical Exam well-developed well-nourished female no acute distress.  Alert and oriented x 3.  Ortho Exam left knee exam: Range of motion 5 to 100 degrees.  Medial joint line tenderness.  Moderate patellofemoral crepitus.  She is neurovascular intact distally.  Specialty Comments:  No specialty comments available.  Imaging: XR KNEE 3 VIEW LEFT Result Date: 07/01/2023 X-rays demonstrate advanced patellofemoral degenerative changes with moderate medial and lateral degenerative changes    PMFS History: Patient Active Problem List   Diagnosis Date Noted   Encounter for monitoring dofetilide  therapy 05/30/2023   Hypercoagulable state due to persistent atrial fibrillation (HCC) 02/26/2022   Intermediate stage nonexudative age-related macular degeneration of right eye 10/28/2020   Persistent atrial fibrillation (HCC) 01/25/2018   Nuclear sclerosis, right 11/22/2017   Primary localized osteoarthritis of right knee 04/23/2016   Left posterior capsular opacification 10/28/2015   Nuclear sclerotic cataract of right eye 06/03/2015   Pseudophakia  of left eye 02/28/2015   Paroxysmal atrial fibrillation (HCC) 02/10/2015   Obstructive sleep apnea syndrome 02/10/2015   Nuclear cataract of both eyes 11/05/2014   HTN (hypertension)    Obesity    Retinal edema 12/19/2012   High risk medication use 03/31/2012   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   Subretinal neovascularization of macula, left 09/24/2011   Past Medical History:  Diagnosis Date   A-fib (HCC)     Anemia    hx    Anxiety    Arthritis    was in right knee (01/25/2018)   Dysrhythmia    atrial fib/flutter   GERD (gastroesophageal reflux disease)    occ   HTN (hypertension)    Migraine    used to have them more frequently before menopause; now only have a couple/yr (01/25/2018)   Obesity    OSA on CPAP    PAF (paroxysmal atrial fibrillation) (HCC)    s/p DCCV 2010   Peripheral vascular disease (HCC) 2016   after flying in leg    Family History  Problem Relation Age of Onset   Heart disease Mother    Stomach cancer Paternal Grandfather    Colon cancer Neg Hx     Past Surgical History:  Procedure Laterality Date   ATRIAL FIBRILLATION ABLATION N/A 01/25/2018   Procedure: ATRIAL FIBRILLATION ABLATION;  Surgeon: Inocencio Soyla Lunger, MD;  Location: [facility];  Service: Cardiovascular;  Laterality: N/A;   ATRIAL FIBRILLATION ABLATION N/A 04/11/2020   Procedure: ATRIAL FIBRILLATION ABLATION;  Surgeon: Inocencio Soyla Lunger, MD;  Location: [facility];  Service: Cardiovascular;  Laterality: N/A;   CARDIAC CATHETERIZATION  2005   CARDIOVERSION N/A 12/09/2017   Procedure: CARDIOVERSION;  Surgeon: Francyne Headland, MD;  Location: [facility] ENDOSCOPY;  Service: Cardiovascular;  Laterality: N/A;   CARDIOVERSION N/A 05/09/2018   Procedure: CARDIOVERSION;  Surgeon: Delford Maude BROCKS, MD;  Location: [facility] ENDOSCOPY;  Service: Cardiovascular;  Laterality: N/A;   CARDIOVERSION N/A 01/03/2019   Procedure: CARDIOVERSION;  Surgeon: Alveta Aleene PARAS, MD;  Location: [facility] ENDOSCOPY;  Service: Cardiovascular;  Laterality: N/A;   CARDIOVERSION N/A 05/27/2020   Procedure: CARDIOVERSION;  Surgeon: Hobart Powell BRAVO, MD;  Location: [facility] ENDOSCOPY;  Service: Cardiovascular;  Laterality: N/A;   CARDIOVERSION N/A 03/05/2022   Procedure: CARDIOVERSION;  Surgeon: Lonni Slain, MD;  Location: [facility] ENDOSCOPY;  Service: Cardiovascular;  Laterality: N/A;   CATARACT EXTRACTION W/ INTRAOCULAR LENS  IMPLANT Left 2016   ELECTROPHYSIOLOGIC STUDY N/A 12/04/2015   Procedure: Atrial Fibrillation Ablation;  Surgeon: Will Lunger Inocencio, MD;  Location: [facility];  Service: Cardiovascular;  Laterality: N/A;   JOINT REPLACEMENT     TONSILLECTOMY AND ADENOIDECTOMY     TOTAL KNEE ARTHROPLASTY Right 04/23/2016   Procedure: TOTAL KNEE ARTHROPLASTY;  Surgeon: Toribio Silos, MD;  Location: [facility] OR;  Service: Orthopedics;  Laterality: Right;   TUBAL LIGATION     Social History   Occupational History   Not on file  Tobacco Use   Smoking status: Never   Smokeless tobacco: Never   Tobacco comments:    Never smoke 03/15/22  Vaping Use   Vaping status: Never Used  Substance and Sexual Activity   Alcohol  use: Yes    Alcohol /week: 1.0 standard drink of alcohol     Types: 1 Standard drinks or equivalent per week    Comment: 01/25/2018 couple drinks/month; if that   Drug use: Never   Sexual activity: Yes

## 2023-07-07 ENCOUNTER — Ambulatory Visit: Payer: PPO | Admitting: Physician Assistant

## 2023-07-07 ENCOUNTER — Encounter: Payer: Self-pay | Admitting: Physician Assistant

## 2023-07-07 ENCOUNTER — Ambulatory Visit: Payer: PPO | Admitting: Orthopedic Surgery

## 2023-07-07 DIAGNOSIS — M1712 Unilateral primary osteoarthritis, left knee: Secondary | ICD-10-CM

## 2023-07-07 MED ORDER — BUPIVACAINE HCL 0.25 % IJ SOLN
2.0000 mL | INTRAMUSCULAR | Status: AC | PRN
  Administered 2023-07-07: 2 mL via INTRA_ARTICULAR

## 2023-07-07 MED ORDER — METHYLPREDNISOLONE ACETATE 40 MG/ML IJ SUSP
40.0000 mg | INTRAMUSCULAR | Status: AC | PRN
  Administered 2023-07-07: 40 mg via INTRA_ARTICULAR

## 2023-07-07 MED ORDER — LIDOCAINE HCL 1 % IJ SOLN
2.0000 mL | INTRAMUSCULAR | Status: AC | PRN
  Administered 2023-07-07: 2 mL

## 2023-07-07 NOTE — Progress Notes (Signed)
   Office Visit Note   Patient: Deborah [name]           Date of Birth: 26-Nov-1955           MRN: [id number] Visit Date: 07/07/2023              Requested by: No referring provider defined for this encounter. PCP: Hugh Charleston, MD (Inactive)   Assessment & Plan: Visit Diagnoses:  1. Unilateral primary osteoarthritis, left knee       Follow-Up Instructions: Return if symptoms worsen or fail to improve.   Orders:  Orders Placed This Encounter  Procedures   Large Joint Inj: L knee   No orders of the defined types were placed in this encounter.     Procedures: Large Joint Inj: L knee on 07/07/2023 10:23 AM Indications: pain Details: 22 G needle, anterolateral approach Medications: 2 mL lidocaine  1 %; 2 mL bupivacaine  0.25 %; 40 mg methylPREDNISolone  acetate 40 MG/ML      Clinical Data: No additional findings.   Subjective: Chief Complaint  Patient presents with   Left Knee - Pain    HPI patient is a pleasant 68 year old female who I saw last week with left knee OA.  We had discussed cortisone injection at that time but she wanted to check with her cardiologist to see if this was okay as she had recently started new medication.  She is here today because she was given the okay to proceed with cortisone injection.  Left knee was injected with cortisone today.  She tolerated this well.  Follow-up as needed.

## 2023-08-05 DIAGNOSIS — H524 Presbyopia: Secondary | ICD-10-CM | POA: Diagnosis not present

## 2023-08-05 DIAGNOSIS — H5213 Myopia, bilateral: Secondary | ICD-10-CM | POA: Diagnosis not present

## 2023-08-05 DIAGNOSIS — H31012 Macula scars of posterior pole (postinflammatory) (post-traumatic), left eye: Secondary | ICD-10-CM | POA: Diagnosis not present

## 2023-08-05 DIAGNOSIS — H353112 Nonexudative age-related macular degeneration, right eye, intermediate dry stage: Secondary | ICD-10-CM | POA: Diagnosis not present

## 2023-08-05 DIAGNOSIS — H2513 Age-related nuclear cataract, bilateral: Secondary | ICD-10-CM | POA: Diagnosis not present

## 2023-08-28 ENCOUNTER — Other Ambulatory Visit (HOSPITAL_COMMUNITY): Payer: Self-pay | Admitting: Internal Medicine

## 2023-09-19 NOTE — Progress Notes (Unsigned)
  Electrophysiology Office Note:   Date:  09/20/2023  ID:  LORELY BUBB, DOB [birth year], MRN [id number]  Primary Cardiologist: None Electrophysiologist: Will Jorja Loa, MD      History of Present Illness:   Deborah [name] is a 68 y.o. female with h/o persistent AF, HTN, and OSA seen today for routine electrophysiology followup.   Since last being seen in our clinic the patient reports doing well overall. Currently,  she denies chest pain, palpitations, dyspnea, PND, orthopnea, nausea, vomiting, dizziness, syncope, edema, weight gain, or early satiety.   Review of systems complete and found to be negative unless listed in HPI.   EP Information / Studies Reviewed:    EKG is ordered today. Personal review as below.  EKG Interpretation Date/Time:  Tuesday September 20 2023 12:08:56 EDT Ventricular Rate:  74 PR Interval:  148 QRS Duration:  158 QT Interval:  468 QTC Calculation: 519 R Axis:   78  Text Interpretation: Sinus rhythm with frequent Premature ventricular complexes Right bundle branch block When compared with ECG of 20-Jun-2023 10:30, No significant change was found Confirmed by Maxine Glenn [id number]) on 09/20/2023 12:48:27 PM    Arrhythmia/Device History S/p AF ablation 11/2015, 12/2017, and 03/2020   Physical Exam:   VS:  BP 128/78   Pulse 74   Ht 5\' 11"  (1.803 m)   Wt 278 lb 3.2 oz (126.2 kg)   SpO2 98%   BMI 38.80 kg/m    Wt Readings from Last 3 Encounters:  09/20/23 278 lb 3.2 oz (126.2 kg)  06/20/23 276 lb (125.2 kg)  05/30/23 275 lb (124.7 kg)     GEN: No acute distress NECK: No JVD; No carotid bruits CARDIAC: Regular rate and rhythm, no murmurs, rubs, gallops RESPIRATORY:  Clear to auscultation without rales, wheezing or rhonchi  ABDOMEN: Soft, non-tender, non-distended EXTREMITIES:  No edema; No deformity   ASSESSMENT AND PLAN:    Persistent Atrial fibrillation High risk drug monitoring - Tikosyn S/p multiple ablations EKG today shows NSR with  multifocal PVCs and stable intervals when corrected for QRS.  Continue tikosyn 500 mcg BID Labs today  Secondary hypercoagulable state Pt on Eliquis as above   HTN Stable on current regimen   Upcoming cardiac clearance She will eventually need knee surgery.  No specific concerns from a cardiac perspective. Denies SOB or CP.  Would not need further cardiac visit as long as she doesn't develop new symptoms.    Follow up with EP APP in 3 months  Signed, Graciella Freer, PA-C

## 2023-09-20 ENCOUNTER — Encounter: Payer: Self-pay | Admitting: Student

## 2023-09-20 ENCOUNTER — Ambulatory Visit: Payer: PPO | Attending: Pulmonary Disease | Admitting: Student

## 2023-09-20 VITALS — BP 128/78 | HR 74 | Ht 71.0 in | Wt 278.2 lb

## 2023-09-20 DIAGNOSIS — I4819 Other persistent atrial fibrillation: Secondary | ICD-10-CM

## 2023-09-20 DIAGNOSIS — D6869 Other thrombophilia: Secondary | ICD-10-CM

## 2023-09-20 DIAGNOSIS — I10 Essential (primary) hypertension: Secondary | ICD-10-CM | POA: Diagnosis not present

## 2023-09-20 DIAGNOSIS — G4733 Obstructive sleep apnea (adult) (pediatric): Secondary | ICD-10-CM | POA: Diagnosis not present

## 2023-09-20 NOTE — Patient Instructions (Signed)
 Medication Instructions:  Your physician recommends that you continue on your current medications as directed. Please refer to the Current Medication list given to you today.  *If you need a refill on your cardiac medications before your next appointment, please call your pharmacy*  Lab Work: BMET, MAG-TODAY If you have labs (blood work) drawn today and your tests are completely normal, you will receive your results only by: MyChart Message (if you have MyChart) OR A paper copy in the mail If you have any lab test that is abnormal or we need to change your treatment, we will call you to review the results.  Follow-Up: At [facility], you and your health needs are our priority.  As part of our continuing mission to provide you with exceptional heart care, we have created designated Provider Care Teams.  These Care Teams include your primary Cardiologist (physician) and Advanced Practice Providers (APPs -  Physician Assistants and Nurse Practitioners) who all work together to provide you with the care you need, when you need it.   Your next appointment:   3-4 month(s)  Provider:   You will see one of the following Advanced Practice Providers on your designated Care Team:   Francis Dowse, Charlott Holler [address]" [place], [place] Sherie Don, NP Canary Brim, NP

## 2023-09-21 LAB — BASIC METABOLIC PANEL
BUN/Creatinine Ratio: 13 (ref 12–28)
BUN: 13 mg/dL (ref 8–27)
CO2: 24 mmol/L (ref 20–29)
Calcium: 10.1 mg/dL (ref 8.7–10.3)
Chloride: 100 mmol/L (ref 96–106)
Creatinine, Ser: 1.03 mg/dL — ABNORMAL HIGH (ref 0.57–1.00)
Glucose: 73 mg/dL (ref 70–99)
Potassium: 4.9 mmol/L (ref 3.5–5.2)
Sodium: 140 mmol/L (ref 134–144)
eGFR: 59 mL/min/{1.73_m2} — ABNORMAL LOW (ref >59)

## 2023-09-21 LAB — MAGNESIUM: Magnesium: 2.3 mg/dL (ref 1.6–2.3)

## 2023-09-22 ENCOUNTER — Ambulatory Visit (HOSPITAL_COMMUNITY): Payer: PPO | Admitting: Internal Medicine

## 2023-09-27 DIAGNOSIS — Z7901 Long term (current) use of anticoagulants: Secondary | ICD-10-CM | POA: Diagnosis not present

## 2023-09-27 DIAGNOSIS — H318 Other specified disorders of choroid: Secondary | ICD-10-CM | POA: Diagnosis not present

## 2023-09-27 DIAGNOSIS — H35052 Retinal neovascularization, unspecified, left eye: Secondary | ICD-10-CM | POA: Diagnosis not present

## 2023-09-27 DIAGNOSIS — I4891 Unspecified atrial fibrillation: Secondary | ICD-10-CM | POA: Diagnosis not present

## 2023-09-27 DIAGNOSIS — H353112 Nonexudative age-related macular degeneration, right eye, intermediate dry stage: Secondary | ICD-10-CM | POA: Diagnosis not present

## 2023-09-27 DIAGNOSIS — H26492 Other secondary cataract, left eye: Secondary | ICD-10-CM | POA: Diagnosis not present

## 2023-09-27 DIAGNOSIS — I4892 Unspecified atrial flutter: Secondary | ICD-10-CM | POA: Diagnosis not present

## 2023-09-27 DIAGNOSIS — H3581 Retinal edema: Secondary | ICD-10-CM | POA: Diagnosis not present

## 2023-09-27 DIAGNOSIS — Z961 Presence of intraocular lens: Secondary | ICD-10-CM | POA: Diagnosis not present

## 2023-09-27 DIAGNOSIS — H2511 Age-related nuclear cataract, right eye: Secondary | ICD-10-CM | POA: Diagnosis not present

## 2023-10-24 ENCOUNTER — Ambulatory Visit (HOSPITAL_COMMUNITY): Payer: PPO | Admitting: Internal Medicine

## 2023-10-26 DIAGNOSIS — M1712 Unilateral primary osteoarthritis, left knee: Secondary | ICD-10-CM | POA: Diagnosis not present

## 2023-11-07 DIAGNOSIS — E559 Vitamin D deficiency, unspecified: Secondary | ICD-10-CM | POA: Diagnosis not present

## 2023-11-07 DIAGNOSIS — I480 Paroxysmal atrial fibrillation: Secondary | ICD-10-CM | POA: Diagnosis not present

## 2023-11-07 DIAGNOSIS — G4733 Obstructive sleep apnea (adult) (pediatric): Secondary | ICD-10-CM | POA: Diagnosis not present

## 2023-11-07 DIAGNOSIS — Z01818 Encounter for other preprocedural examination: Secondary | ICD-10-CM | POA: Diagnosis not present

## 2023-11-07 DIAGNOSIS — E782 Mixed hyperlipidemia: Secondary | ICD-10-CM | POA: Diagnosis not present

## 2023-11-07 DIAGNOSIS — G629 Polyneuropathy, unspecified: Secondary | ICD-10-CM | POA: Diagnosis not present

## 2023-11-07 DIAGNOSIS — I10 Essential (primary) hypertension: Secondary | ICD-10-CM | POA: Diagnosis not present

## 2023-11-07 DIAGNOSIS — D6869 Other thrombophilia: Secondary | ICD-10-CM | POA: Diagnosis not present

## 2023-11-07 DIAGNOSIS — E538 Deficiency of other specified B group vitamins: Secondary | ICD-10-CM | POA: Diagnosis not present

## 2023-11-07 DIAGNOSIS — R7303 Prediabetes: Secondary | ICD-10-CM | POA: Diagnosis not present

## 2023-11-07 DIAGNOSIS — F331 Major depressive disorder, recurrent, moderate: Secondary | ICD-10-CM | POA: Diagnosis not present

## 2023-12-19 DIAGNOSIS — M1712 Unilateral primary osteoarthritis, left knee: Secondary | ICD-10-CM | POA: Diagnosis not present

## 2023-12-19 DIAGNOSIS — M25562 Pain in left knee: Secondary | ICD-10-CM | POA: Diagnosis not present

## 2023-12-22 DIAGNOSIS — G4733 Obstructive sleep apnea (adult) (pediatric): Secondary | ICD-10-CM | POA: Diagnosis not present

## 2023-12-22 NOTE — Progress Notes (Signed)
  Electrophysiology Office Note:   Date:  12/23/2023  ID:  ELYANAH FARINO, DOB December 10, 1955, MRN [id number]  Primary Cardiologist: None Electrophysiologist: Will Gladis Norton, MD      History of Present Illness:   Deborah [name] is a 68 y.o. female with h/o persistent AF, HTN, and OSA seen today for routine electrophysiology followup.   Since last being seen in our clinic the patient reports doing well from a cardiac perspective. No breakthrough AF of which she is aware. Otherwise, she denies chest pain, palpitations, dyspnea, PND, orthopnea, nausea, vomiting, dizziness, syncope, edema, weight gain, or early satiety. Pending knee surgery.   Review of systems complete and found to be negative unless listed in HPI.   EP Information / Studies Reviewed:    EKG is ordered today. Personal review as below.  EKG Interpretation Date/Time:  Friday December 23 2023 10:46:06 EDT Ventricular Rate:  70 PR Interval:  142 QRS Duration:  146 QT Interval:  452 QTC Calculation: 488 R Axis:   72  Text Interpretation: Sinus rhythm with occasional Premature ventricular complexes and Premature atrial complexes Right bundle branch block QT OK when measured manually Confirmed by Lesia Sharper [phone]) on 12/23/2023 10:55:18 AM    Arrhythmia/Device History S/p AF ablation 11/2015, 12/2017, and 03/2020   Physical Exam:   VS:  BP (!) 142/74   Pulse 70   Ht 6' 1 (1.854 m)   Wt 275 lb (124.7 kg)   BMI 36.28 kg/m    Wt Readings from Last 3 Encounters:  12/23/23 275 lb (124.7 kg)  09/20/23 278 lb 3.2 oz (126.2 kg)  06/20/23 276 lb (125.2 kg)     GEN: No acute distress NECK: No JVD; No carotid bruits CARDIAC: Regular rate and rhythm, no murmurs, rubs, gallops RESPIRATORY:  Clear to auscultation without rales, wheezing or rhonchi  ABDOMEN: Soft, non-tender, non-distended EXTREMITIES:  No edema; No deformity   ASSESSMENT AND PLAN:    Persistent AF High risk medication monitoring - Tikosyn  S/p multiple  ablations EKG today shows NSR with stable intervals Continue tikosyn  500 mcg BID Labs today  Secondary hypercoagulable state Pt on Eliquis  as above  HTN Stable on current regimen  Upcoming cardiac clearance She is scheduled for knee surgery in July No concerns from a cardiac perspective.  The patient is at low risk to proceed without further work up.  If the patient has new chest pain or SOB prior to surgery, they should be revaluated.  She can hold Eliquis  but should avoid holding Tikosyn   Follow up with EP Team in 3 months  Signed, Sharper Prentice Lesia, PA-C

## 2023-12-23 ENCOUNTER — Ambulatory Visit: Attending: Student | Admitting: Student

## 2023-12-23 ENCOUNTER — Encounter: Payer: Self-pay | Admitting: Student

## 2023-12-23 VITALS — BP 142/74 | HR 70 | Ht 73.0 in | Wt 275.0 lb

## 2023-12-23 DIAGNOSIS — I4819 Other persistent atrial fibrillation: Secondary | ICD-10-CM

## 2023-12-23 DIAGNOSIS — G4733 Obstructive sleep apnea (adult) (pediatric): Secondary | ICD-10-CM

## 2023-12-23 DIAGNOSIS — I10 Essential (primary) hypertension: Secondary | ICD-10-CM | POA: Diagnosis not present

## 2023-12-23 DIAGNOSIS — Z79899 Other long term (current) drug therapy: Secondary | ICD-10-CM | POA: Diagnosis not present

## 2023-12-23 DIAGNOSIS — D6869 Other thrombophilia: Secondary | ICD-10-CM

## 2023-12-23 NOTE — Patient Instructions (Signed)
 Medication Instructions:  Your physician recommends that you continue on your current medications as directed. Please refer to the Current Medication list given to you today.  *If you need a refill on your cardiac medications before your next appointment, please call your pharmacy*  Lab Work: BMET, MAG-TODAY If you have labs (blood work) drawn today and your tests are completely normal, you will receive your results only by: MyChart Message (if you have MyChart) OR A paper copy in the mail If you have any lab test that is abnormal or we need to change your treatment, we will call you to review the results.  Follow-Up: At [facility], you and your health needs are our priority.  As part of our continuing mission to provide you with exceptional heart care, our providers are all part of one team.  This team includes your primary Cardiologist (physician) and Advanced Practice Providers or APPs (Physician Assistants and Nurse Practitioners) who all work together to provide you with the care you need, when you need it.  Your next appointment:   3 month(s)  Provider:   You may see Will Gladis Norton, MD or one of the following Advanced Practice Providers on your designated Care Team:   Charlies Arthur, PA-C Michael Andy Tillery, PA-C Suzann Riddle, NP Daphne Barrack, NP

## 2023-12-24 ENCOUNTER — Ambulatory Visit: Payer: Self-pay | Admitting: Student

## 2023-12-24 LAB — BASIC METABOLIC PANEL WITH GFR
BUN/Creatinine Ratio: 12 (ref 12–28)
BUN: 12 mg/dL (ref 8–27)
CO2: 23 mmol/L (ref 20–29)
Calcium: 9.6 mg/dL (ref 8.7–10.3)
Chloride: 104 mmol/L (ref 96–106)
Creatinine, Ser: 1.04 mg/dL — ABNORMAL HIGH (ref 0.57–1.00)
Glucose: 85 mg/dL (ref 70–99)
Potassium: 4.7 mmol/L (ref 3.5–5.2)
Sodium: 142 mmol/L (ref 134–144)
eGFR: 58 mL/min/{1.73_m2} — ABNORMAL LOW (ref >59)

## 2023-12-24 LAB — MAGNESIUM: Magnesium: 2.3 mg/dL (ref 1.6–2.3)

## 2024-01-10 NOTE — Progress Notes (Signed)
 Date of COVID positive in last 90 days:  PCP - Milo Costa, DO Cardiologist - Soyla Norton, MD  Cardiac clearance in Epic dated 12-23-23 in Epic  Chest x-ray -  EKG - 12-23-23 Epic Stress Test -  ECHO - 11-25-15 Epic Cardiac Cath -  Pacemaker/ICD device last checked: Spinal Cord Stimulator: Afib ablation - 04-11-20 Epic Cardiac CT - 04-07-20 Epic  Bowel Prep -   Sleep Study - Yes, +sleep apnea CPAP -   Fasting Blood Sugar -  Checks Blood Sugar _____ times a day  Last dose of GLP1 agonist-  N/A GLP1 instructions:  Do not take after     Last dose of SGLT-2 inhibitors-  N/A SGLT-2 instructions:  Do not take after    Blood Thinner Instructions:  Eliquis  Last dose:   Time: Aspirin Instructions: Last Dose:  Activity level:  Can go up a flight of stairs and perform activities of daily living without stopping and without symptoms of chest pain or shortness of breath.  Able to exercise without symptoms  Unable to go up a flight of stairs without symptoms of     Anesthesia review: Afib, HTN, OSA  Patient denies shortness of breath, fever, cough and chest pain at PAT appointment  Patient verbalized understanding of instructions that were given to them at the PAT appointment. Patient was also instructed that they will need to review over the PAT instructions again at home before surgery.

## 2024-01-10 NOTE — Patient Instructions (Addendum)
 SURGICAL WAITING ROOM VISITATION Patients having surgery or a procedure may have no more than 2 support people in the waiting area - these visitors may rotate.    Children under the age of 57 must have an adult with them who is not the patient.  If the patient needs to stay at the hospital during part of their recovery, the visitor guidelines for inpatient rooms apply. Pre-op nurse will coordinate an appropriate time for 1 support person to accompany patient in pre-op.  This support person may not rotate.    Please refer to the [facility] website for the visitor guidelines for Inpatients (after your surgery is over and you are in a regular room).       Your procedure is scheduled on: 01-17-24   Report to [facility] Main Entrance    Report to admitting at 9:00 AM   Call this number if you have problems the morning of surgery [phone]   Do not eat food :After Midnight.   After Midnight you may have the following liquids until 8:20 AM DAY OF SURGERY  Water Non-Citrus Juices (without pulp, NO RED-Apple, White grape, White cranberry) Black Coffee (NO MILK/CREAM OR CREAMERS, sugar ok)  Clear Tea (NO MILK/CREAM OR CREAMERS, sugar ok) regular and decaf                             Plain Jell-O (NO RED)                                           Fruit ices (not with fruit pulp, NO RED)                                     Popsicles (NO RED)                                                               Sports drinks like Gatorade (NO RED)                   The day of surgery:  Drink ONE (1) Pre-Surgery Clear Ensure by 8:20 AM the morning of surgery. Drink in one sitting. Do not sip.  This drink was given to you during your hospital  pre-op appointment visit. Nothing else to drink after completing the Pre-Surgery Clear Ensure.          If you have questions, please contact your surgeon's office.   FOLLOW  ANY ADDITIONAL PRE OP INSTRUCTIONS YOU RECEIVED FROM YOUR SURGEON'S  OFFICE!!!     Oral Hygiene is also important to reduce your risk of infection.                                    Remember - BRUSH YOUR TEETH THE MORNING OF SURGERY WITH YOUR REGULAR TOOTHPASTE   Do NOT smoke after Midnight   Take these medicines the morning of surgery with A SIP OF WATER:    Dofetilide    If  needed Tylenol , Alprazolam    Hold Eliquis  - 72 hours prior to surgery ( do not take after 01-13-24)  Stop all vitamins and herbal supplements 7 days before surgery  Bring CPAP mask and tubing day of surgery.                              You may not have any metal on your body including hair pins, jewelry, and body piercing             Do not wear make-up, lotions, powders, perfumes or deodorant  Do not wear nail polish including gel and S&S, artificial/acrylic nails, or any other type of covering on natural nails including finger and toenails. If you have artificial nails, gel coating, etc. that needs to be removed by a nail salon please have this removed prior to surgery or surgery may need to be canceled/ delayed if the surgeon/ anesthesia feels like they are unable to be safely monitored.   Do not shave  48 hours prior to surgery.    Do not bring valuables to the hospital. [facility] IS NOT RESPONSIBLE   FOR VALUABLES.   Contacts, dentures or bridgework may not be worn into surgery.   Bring small overnight bag day of surgery.   DO NOT BRING YOUR HOME MEDICATIONS TO THE HOSPITAL. PHARMACY WILL DISPENSE MEDICATIONS LISTED ON YOUR MEDICATION LIST TO YOU DURING YOUR ADMISSION IN THE HOSPITAL!   Special Instructions: Bring a copy of your healthcare power of attorney and living will documents the day of surgery if you haven't scanned them before.              Please read over the following fact sheets you were given: IF YOU HAVE QUESTIONS ABOUT YOUR PRE-OP INSTRUCTIONS PLEASE CALL [phone] Gwen  If you received a COVID test during your pre-op visit  it is requested that  you wear a mask when out in public, stay away from anyone that may not be feeling well and notify your surgeon if you develop symptoms. If you test positive for Covid or have been in contact with anyone that has tested positive in the last 10 days please notify you surgeon.    Pre-operative 5 CHG Bath Instructions   You can play a key role in reducing the risk of infection after surgery. Your skin needs to be as free of germs as possible. You can reduce the number of germs on your skin by washing with CHG (chlorhexidine  gluconate) soap before surgery. CHG is an antiseptic soap that kills germs and continues to kill germs even after washing.   DO NOT use if you have an allergy to chlorhexidine /CHG or antibacterial soaps. If your skin becomes reddened or irritated, stop using the CHG and notify one of our RNs at [phone].   Please shower with the CHG soap starting 4 days before surgery using the following schedule:     Please keep in mind the following:  DO NOT shave, including legs and underarms, starting the day of your first shower.   You may shave your face at any point before/day of surgery.  Place clean sheets on your bed the day you start using CHG soap. Use a clean washcloth (not used since being washed) for each shower. DO NOT sleep with pets once you start using the CHG.   CHG Shower Instructions:  If you choose to wash your hair and private area, wash first with  your normal shampoo/soap.  After you use shampoo/soap, rinse your hair and body thoroughly to remove shampoo/soap residue.  Turn the water OFF and apply about 3 tablespoons (45 ml) of CHG soap to a CLEAN washcloth.  Apply CHG soap ONLY FROM YOUR NECK DOWN TO YOUR TOES (washing for 3-5 minutes)  DO NOT use CHG soap on face, private areas, open wounds, or sores.  Pay special attention to the area where your surgery is being performed.  If you are having back surgery, having someone wash your back for you may be  helpful. Wait 2 minutes after CHG soap is applied, then you may rinse off the CHG soap.  Pat dry with a clean towel  Put on clean clothes/pajamas   If you choose to wear lotion, please use ONLY the CHG-compatible lotions on the back of this paper.     Additional instructions for the day of surgery: DO NOT APPLY any lotions, deodorants, cologne, or perfumes.   Put on clean/comfortable clothes.  Brush your teeth.  Ask your nurse before applying any prescription medications to the skin.      CHG Compatible Lotions   Aveeno Moisturizing lotion  Cetaphil Moisturizing Cream  Cetaphil Moisturizing Lotion  Clairol Herbal Essence Moisturizing Lotion, Dry Skin  Clairol Herbal Essence Moisturizing Lotion, Extra Dry Skin  Clairol Herbal Essence Moisturizing Lotion, Normal Skin  Curel Age Defying Therapeutic Moisturizing Lotion with Alpha Hydroxy  Curel Extreme Care Body Lotion  Curel Soothing Hands Moisturizing Hand Lotion  Curel Therapeutic Moisturizing Cream, Fragrance-Free  Curel Therapeutic Moisturizing Lotion, Fragrance-Free  Curel Therapeutic Moisturizing Lotion, Original Formula  Eucerin Daily Replenishing Lotion  Eucerin Dry Skin Therapy Plus Alpha Hydroxy Crme  Eucerin Dry Skin Therapy Plus Alpha Hydroxy Lotion  Eucerin Original Crme  Eucerin Original Lotion  Eucerin Plus Crme Eucerin Plus Lotion  Eucerin TriLipid Replenishing Lotion  Keri Anti-Bacterial Hand Lotion  Keri Deep Conditioning Original Lotion Dry Skin Formula Softly Scented  Keri Deep Conditioning Original Lotion, Fragrance Free Sensitive Skin Formula  Keri Lotion Fast Absorbing Fragrance Free Sensitive Skin Formula  Keri Lotion Fast Absorbing Softly Scented Dry Skin Formula  Keri Original Lotion  Keri Skin Renewal Lotion Keri Silky Smooth Lotion  Keri Silky Smooth Sensitive Skin Lotion  Nivea Body Creamy Conditioning Oil  Nivea Body Extra Enriched Lotion  Nivea Body Original Lotion  Nivea Body Sheer  Moisturizing Lotion Nivea Crme  Nivea Skin Firming Lotion  NutraDerm 30 Skin Lotion  NutraDerm Skin Lotion  NutraDerm Therapeutic Skin Cream  NutraDerm Therapeutic Skin Lotion  ProShield Protective Hand Cream  Provon moisturizing lotion   PATIENT SIGNATURE_________________________________  NURSE SIGNATURE__________________________________  ________________________________________________________________________    Deborah [name]  An incentive spirometer is a tool that can help keep your lungs clear and active. This tool measures how well you are filling your lungs with each breath. Taking long deep breaths may help reverse or decrease the chance of developing breathing (pulmonary) problems (especially infection) following: A long period of time when you are unable to move or be active. BEFORE THE PROCEDURE  If the spirometer includes an indicator to show your best effort, your nurse or respiratory therapist will set it to a desired goal. If possible, sit up straight or lean slightly forward. Try not to slouch. Hold the incentive spirometer in an upright position. INSTRUCTIONS FOR USE  Sit on the edge of your bed if possible, or sit up as far as you can in bed or on a chair. Hold the incentive spirometer  in an upright position. Breathe out normally. Place the mouthpiece in your mouth and seal your lips tightly around it. Breathe in slowly and as deeply as possible, raising the piston or the ball toward the top of the column. Hold your breath for 3-5 seconds or for as long as possible. Allow the piston or ball to fall to the bottom of the column. Remove the mouthpiece from your mouth and breathe out normally. Rest for a few seconds and repeat Steps 1 through 7 at least 10 times every 1-2 hours when you are awake. Take your time and take a few normal breaths between deep breaths. The spirometer may include an indicator to show your best effort. Use the indicator as a goal to work  toward during each repetition. After each set of 10 deep breaths, practice coughing to be sure your lungs are clear. If you have an incision (the cut made at the time of surgery), support your incision when coughing by placing a pillow or rolled up towels firmly against it. Once you are able to get out of bed, walk around indoors and cough well. You may stop using the incentive spirometer when instructed by your caregiver.  RISKS AND COMPLICATIONS Take your time so you do not get dizzy or light-headed. If you are in pain, you may need to take or ask for pain medication before doing incentive spirometry. It is harder to take a deep breath if you are having pain. AFTER USE Rest and breathe slowly and easily. It can be helpful to keep track of a log of your progress. Your caregiver can provide you with a simple table to help with this. If you are using the spirometer at home, follow these instructions: SEEK MEDICAL CARE IF:  You are having difficultly using the spirometer. You have trouble using the spirometer as often as instructed. Your pain medication is not giving enough relief while using the spirometer. You develop fever of 100.5 F (38.1 C) or higher. SEEK IMMEDIATE MEDICAL CARE IF:  You cough up bloody sputum that had not been present before. You develop fever of 102 F (38.9 C) or greater. You develop worsening pain at or near the incision site. MAKE SURE YOU:  Understand these instructions. Will watch your condition. Will get help right away if you are not doing well or get worse. Document Released: 10/25/2006 Document Revised: 09/06/2011 Document Reviewed: 12/26/2006 [facility] Patient Information 2014 [place], [place].   ________________________________________________________________________

## 2024-01-11 ENCOUNTER — Other Ambulatory Visit: Payer: Self-pay

## 2024-01-11 ENCOUNTER — Encounter (HOSPITAL_COMMUNITY)
Admission: RE | Admit: 2024-01-11 | Discharge: 2024-01-11 | Disposition: A | Discharge status: Home / Self Care | Source: Ambulatory Visit | Attending: Orthopedic Surgery | Admitting: Orthopedic Surgery

## 2024-01-11 ENCOUNTER — Encounter (HOSPITAL_COMMUNITY): Payer: Self-pay

## 2024-01-11 VITALS — BP 129/93 | HR 68 | Temp 98.6°F | Resp 16 | Ht 73.0 in | Wt 271.2 lb

## 2024-01-11 DIAGNOSIS — G4733 Obstructive sleep apnea (adult) (pediatric): Secondary | ICD-10-CM | POA: Diagnosis not present

## 2024-01-11 DIAGNOSIS — Z7901 Long term (current) use of anticoagulants: Secondary | ICD-10-CM | POA: Insufficient documentation

## 2024-01-11 DIAGNOSIS — D649 Anemia, unspecified: Secondary | ICD-10-CM | POA: Diagnosis not present

## 2024-01-11 DIAGNOSIS — Z01812 Encounter for preprocedural laboratory examination: Secondary | ICD-10-CM | POA: Insufficient documentation

## 2024-01-11 DIAGNOSIS — I4819 Other persistent atrial fibrillation: Secondary | ICD-10-CM | POA: Insufficient documentation

## 2024-01-11 DIAGNOSIS — M1712 Unilateral primary osteoarthritis, left knee: Secondary | ICD-10-CM | POA: Insufficient documentation

## 2024-01-11 DIAGNOSIS — I10 Essential (primary) hypertension: Secondary | ICD-10-CM | POA: Insufficient documentation

## 2024-01-11 DIAGNOSIS — I739 Peripheral vascular disease, unspecified: Secondary | ICD-10-CM | POA: Diagnosis not present

## 2024-01-11 DIAGNOSIS — Z01818 Encounter for other preprocedural examination: Secondary | ICD-10-CM

## 2024-01-11 HISTORY — DX: Unspecified malignant neoplasm of skin, unspecified: C44.90

## 2024-01-11 LAB — CBC
HCT: 45.3 % (ref 36.0–46.0)
Hemoglobin: 13.8 g/dL (ref 12.0–15.0)
MCH: 28.2 pg (ref 26.0–34.0)
MCHC: 30.5 g/dL (ref 30.0–36.0)
MCV: 92.4 fL (ref 80.0–100.0)
Platelets: 288 K/uL (ref 150–400)
RBC: 4.9 MIL/uL (ref 3.87–5.11)
RDW: 14.4 % (ref 11.5–15.5)
WBC: 6.7 K/uL (ref 4.0–10.5)
nRBC: 0 % (ref 0.0–0.2)

## 2024-01-11 LAB — SURGICAL PCR SCREEN
MRSA, PCR: NEGATIVE
Staphylococcus aureus: NEGATIVE

## 2024-01-12 NOTE — Progress Notes (Signed)
 Anesthesia Chart Review   Case: [id number] Date/Time: 01/17/24 1109   Procedure: ARTHROPLASTY, KNEE, TOTAL (Left: Knee)   Anesthesia type: Spinal   Diagnosis: Primary osteoarthritis of left knee [M17.12]   Pre-op diagnosis: Left knee osteoarthritis   Location: [facility] ROOM 10 / [facility]   Surgeons: Ernie Cough, MD       DISCUSSION:68 y.o. never smoker with h/o HTN, OSA on CPAP, persistent AF, PVD, left knee OA scheduled for above procedure 01/17/24 with Dr. Cough Ernie.   Pt see by cardiology 12/23/2023. Pt followed for a-fib, s/p multiple ablations. On Tikosyn  500mcg BID, Eliquis . Pt not aware of breakthrough AF.  Denies cv sx. HTN stable. Per note, She is scheduled for knee surgery in July No concerns from a cardiac perspective.  The patient is at low risk to proceed without further work up.  If the patient has new chest pain or SOB prior to surgery, they should be revaluated.  She can hold Eliquis  but should avoid holding Tikosyn   Pt reports last dose of Elqiuis 01/13/2024.   VS: BP (!) 129/93   Pulse 68   Temp 37 C (Oral)   Resp 16   Ht 6' 1 (1.854 m)   Wt 123 kg   SpO2 95%   BMI 35.78 kg/m   PROVIDERS: Auston Opal, DO is PCP   Cardiologist - Soyla Norton, MD  LABS: Labs reviewed: Acceptable for surgery. (all labs ordered are listed, but only abnormal results are displayed)  Labs Reviewed  SURGICAL PCR SCREEN  CBC     IMAGES:   EKG:   CV: Echo 11/25/2015 - Left ventricle: Systolic function was normal. The estimated    ejection fraction was in the range of 50% to 55%. The study is    not technically sufficient to allow evaluation of LV diastolic    function.  - Left atrium: The appendage was moderately dilated.  - Atrial septum: No defect or patent foramen ovale was identified.  Past Medical History:  Diagnosis Date   A-fib (HCC)    Anemia    hx    Anxiety    Arthritis    was in right knee (01/25/2018)   Dysrhythmia    atrial fib/flutter   GERD  (gastroesophageal reflux disease)    occ   HTN (hypertension)    Migraine    used to have them more frequently before menopause; now only have a couple/yr (01/25/2018)   Obesity    OSA on CPAP    PAF (paroxysmal atrial fibrillation) (HCC)    s/p DCCV 2010   Peripheral vascular disease (HCC) 2016   after flying in leg   Skin cancer    Leg    Past Surgical History:  Procedure Laterality Date   ATRIAL FIBRILLATION ABLATION N/A 01/25/2018   Procedure: ATRIAL FIBRILLATION ABLATION;  Surgeon: Norton Soyla Lunger, MD;  Location: [facility]VAS[facility] LAB;  Service: Cardiovascular;  Laterality: N/A;   ATRIAL FIBRILLATION ABLATION N/A 04/11/2020   Procedure: ATRIAL FIBRILLATION ABLATION;  Surgeon: Norton Soyla Lunger, MD;  Location: [facility] INVASIVE CV LAB;  Service: Cardiovascular;  Laterality: N/A;   CARDIAC CATHETERIZATION  2005   CARDIOVERSION N/A 12/09/2017   Procedure: CARDIOVERSION;  Surgeon: Francyne Headland, MD;  Location: [facility] ENDOSCOPY;  Service: Cardiovascular;  Laterality: N/A;   CARDIOVERSION N/A 05/09/2018   Procedure: CARDIOVERSION;  Surgeon: Delford Maude BROCKS, MD;  Location: [facility] ENDOSCOPY;  Service: Cardiovascular;  Laterality: N/A;   CARDIOVERSION N/A 01/03/2019   Procedure: CARDIOVERSION;  Surgeon: Alveta,  Aleene PARAS, MD;  Location: [facility] ENDOSCOPY;  Service: Cardiovascular;  Laterality: N/A;   CARDIOVERSION N/A 05/27/2020   Procedure: CARDIOVERSION;  Surgeon: Hobart Powell BRAVO, MD;  Location: [facility] ENDOSCOPY;  Service: Cardiovascular;  Laterality: N/A;   CARDIOVERSION N/A 03/05/2022   Procedure: CARDIOVERSION;  Surgeon: Lonni Slain, MD;  Location: [facility] ENDOSCOPY;  Service: Cardiovascular;  Laterality: N/A;   CATARACT EXTRACTION W/ INTRAOCULAR LENS IMPLANT Left 2016   ELECTROPHYSIOLOGIC STUDY N/A 12/04/2015   Procedure: Atrial Fibrillation Ablation;  Surgeon: Will Gladis Norton, MD;  Location: [facility]AS[facility] LAB;  Service: Cardiovascular;  Laterality: N/A;   JOINT REPLACEMENT      TONSILLECTOMY AND ADENOIDECTOMY     TOTAL KNEE ARTHROPLASTY Right 04/23/2016   Procedure: TOTAL KNEE ARTHROPLASTY;  Surgeon: Toribio Silos, MD;  Location: [facility] OR;  Service: Orthopedics;  Laterality: Right;   TUBAL LIGATION      MEDICATIONS:  acetaminophen  (TYLENOL ) 500 MG tablet   ALPRAZolam  (XANAX ) 0.5 MG tablet   buPROPion  (WELLBUTRIN  XL) 150 MG 24 hr tablet   Cholecalciferol  (VITAMIN D3) 125 MCG (5000 UT) TABS   dofetilide  (TIKOSYN ) 500 MCG capsule   ELIQUIS  5 MG TABS tablet   losartan  (COZAAR ) 50 MG tablet   methocarbamol  (ROBAXIN ) 500 MG tablet   Multiple Vitamins-Minerals (ICAPS AREDS FORMULA PO)   ondansetron  (ZOFRAN -ODT) 4 MG disintegrating tablet   oxyCODONE  (OXY IR/ROXICODONE ) 5 MG immediate release tablet   Polyethyl Glycol-Propyl Glycol (SYSTANE) 0.4-0.3 % SOLN   vitamin B-12 (CYANOCOBALAMIN ) 1000 MCG tablet   No current facility-administered medications for this encounter.   Harlene Hoots Ward, PA-C [facility] Pre-Surgical Testing [phone]

## 2024-01-12 NOTE — Anesthesia Preprocedure Evaluation (Addendum)
 Anesthesia Evaluation  Patient identified by MRN, date of birth, ID band Patient awake    Reviewed: Allergy & Precautions, NPO status , Patient's Chart, lab work & pertinent test results  Airway Mallampati: II  TM Distance: >3 FB Neck ROM: Full    Dental no notable dental hx.    Pulmonary sleep apnea and Continuous Positive Airway Pressure Ventilation    Pulmonary exam normal        Cardiovascular hypertension, Pt. on medications + Peripheral Vascular Disease  + dysrhythmias (Eliquis  7/17) Atrial Fibrillation  Rhythm:Irregular Rate:Normal     Neuro/Psych  Headaches  Anxiety        GI/Hepatic Neg liver ROS,GERD  ,,  Endo/Other  negative endocrine ROS    Renal/GU negative Renal ROS  negative genitourinary   Musculoskeletal  (+) Arthritis , Osteoarthritis,    Abdominal Normal abdominal exam  (+)   Peds  Hematology  (+) Blood dyscrasia, anemia Lab Results      Component                Value               Date                      WBC                      6.7                 01/11/2024                HGB                      13.8                01/11/2024                HCT                      45.3                01/11/2024                MCV                      92.4                01/11/2024                PLT                      288                 01/11/2024              Anesthesia Other Findings   Reproductive/Obstetrics                              Anesthesia Physical Anesthesia Plan  ASA: 3  Anesthesia Plan: MAC, Spinal and Regional   Post-op Pain Management: Regional block*   Induction: Intravenous  PONV Risk Score and Plan: 2 and Ondansetron , Dexamethasone , Treatment may vary due to age or medical condition and Propofol  infusion  Airway Management Planned: Simple Face Mask and Nasal Cannula  Additional Equipment: None  Intra-op Plan:   Post-operative Plan:    Informed Consent: I  have reviewed the patients History and Physical, chart, labs and discussed the procedure including the risks, benefits and alternatives for the proposed anesthesia with the patient or authorized representative who has indicated his/her understanding and acceptance.     Dental advisory given  Plan Discussed with: CRNA  Anesthesia Plan Comments: (See PAT note 01/11/2024)         Anesthesia Quick Evaluation

## 2024-01-16 NOTE — H&P (Signed)
 TOTAL KNEE ADMISSION H&P  Patient is being admitted for left total knee arthroplasty.  Therapy Plans: outpatient therapy at [facility] Disposition: Home with husband Planned DVT Prophylaxis: Eliquis  5 BID DME needed: none PCP: Milo Costa - clearance received Cardio: Dr. Inocencio - went for appointment today, verbal clearance TXA: IV Allergies: tape - rash, sulfa - rush, ACEi - cough Anesthesia Concerns: none BMI: 36.5 Last HgbA1c: Not diabetic   Other: - staying overnight - * Cardio wants tikosyn  morning of surgery * cannot miss 2 doses - oxycodone , robaxin , tylenol  - Had right TKA with Dr. Jane 6 years ago - did well - hx of DVT - All meds sent ahead   Subjective:  Chief Complaint:left knee pain.  HPI: Deborah [name], 68 y.o. female, has a history of pain and functional disability in the left knee due to arthritis and has failed non-surgical conservative treatments for greater than 12 weeks to includeNSAID's and/or analgesics, corticosteriod injections, and activity modification.  Onset of symptoms was gradual, starting 2 years ago with gradually worsening course since that time. The patient noted no past surgery on the left knee(s).  Patient currently rates pain in the left knee(s) at 8 out of 10 with activity. Patient has worsening of pain with activity and weight bearing and pain that interferes with activities of daily living.  Patient has evidence of joint space narrowing by imaging studies. There is no active infection.  Patient Active Problem List   Diagnosis Date Noted   Encounter for monitoring dofetilide  therapy 05/30/2023   Hypercoagulable state due to persistent atrial fibrillation (HCC) 02/26/2022   Intermediate stage nonexudative age-related macular degeneration of right eye 10/28/2020   Persistent atrial fibrillation (HCC) 01/25/2018   Nuclear sclerosis, right 11/22/2017   Primary localized osteoarthritis of right knee 04/23/2016   Left posterior capsular  opacification 10/28/2015   Nuclear sclerotic cataract of right eye 06/03/2015   Pseudophakia of left eye 02/28/2015   Paroxysmal atrial fibrillation (HCC) 02/10/2015   Obstructive sleep apnea syndrome 02/10/2015   Nuclear cataract of both eyes 11/05/2014   HTN (hypertension)    Obesity    Retinal edema 12/19/2012   High risk medication use 03/31/2012   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   Subretinal neovascularization of macula, left 09/24/2011   Past Medical History:  Diagnosis Date   A-fib (HCC)    Anemia    hx    Anxiety    Arthritis    was in right knee (01/25/2018)   Dysrhythmia    atrial fib/flutter   GERD (gastroesophageal reflux disease)    occ   HTN (hypertension)    Migraine    used to have them more frequently before menopause; now only have a couple/yr (01/25/2018)   Obesity    OSA on CPAP    PAF (paroxysmal atrial fibrillation) (HCC)    s/p DCCV 2010   Peripheral vascular disease (HCC) 2016   after flying in leg   Skin cancer    Leg    Past Surgical History:  Procedure Laterality Date   ATRIAL FIBRILLATION ABLATION N/A 01/25/2018   Procedure: ATRIAL FIBRILLATION ABLATION;  Surgeon: Inocencio Soyla Lunger, MD;  Location: [facility];  Service: Cardiovascular;  Laterality: N/A;   ATRIAL FIBRILLATION ABLATION N/A 04/11/2020   Procedure: ATRIAL FIBRILLATION ABLATION;  Surgeon: Inocencio Soyla Lunger, MD;  Location: [facility];  Service: Cardiovascular;  Laterality: N/A;   CARDIAC CATHETERIZATION  2005   CARDIOVERSION N/A  12/09/2017   Procedure: CARDIOVERSION;  Surgeon: Francyne Headland, MD;  Location: [facility] ENDOSCOPY;  Service: Cardiovascular;  Laterality: N/A;   CARDIOVERSION N/A 05/09/2018   Procedure: CARDIOVERSION;  Surgeon: Delford Maude BROCKS, MD;  Location: [facility] ENDOSCOPY;  Service: Cardiovascular;  Laterality: N/A;   CARDIOVERSION N/A 01/03/2019   Procedure: CARDIOVERSION;  Surgeon: Alveta Aleene PARAS,  MD;  Location: [facility] ENDOSCOPY;  Service: Cardiovascular;  Laterality: N/A;   CARDIOVERSION N/A 05/27/2020   Procedure: CARDIOVERSION;  Surgeon: Hobart Powell BRAVO, MD;  Location: [facility] ENDOSCOPY;  Service: Cardiovascular;  Laterality: N/A;   CARDIOVERSION N/A 03/05/2022   Procedure: CARDIOVERSION;  Surgeon: Lonni Slain, MD;  Location: [facility] ENDOSCOPY;  Service: Cardiovascular;  Laterality: N/A;   CATARACT EXTRACTION W/ INTRAOCULAR LENS IMPLANT Left 2016   ELECTROPHYSIOLOGIC STUDY N/A 12/04/2015   Procedure: Atrial Fibrillation Ablation;  Surgeon: Will Gladis Norton, MD;  Location: [facility];  Service: Cardiovascular;  Laterality: N/A;   JOINT REPLACEMENT     TONSILLECTOMY AND ADENOIDECTOMY     TOTAL KNEE ARTHROPLASTY Right 04/23/2016   Procedure: TOTAL KNEE ARTHROPLASTY;  Surgeon: Toribio Silos, MD;  Location: [facility] OR;  Service: Orthopedics;  Laterality: Right;   TUBAL LIGATION      No current facility-administered medications for this encounter.   Current Outpatient Medications  Medication Sig Dispense Refill Last Dose/Taking   acetaminophen  (TYLENOL ) 500 MG tablet Take 500-1,000 mg by mouth every 6 (six) hours as needed (pain.).   Taking As Needed   ALPRAZolam  (XANAX ) 0.5 MG tablet Take 0.25-1 mg by mouth 2 (two) times daily as needed for anxiety.   Taking As Needed   buPROPion  (WELLBUTRIN  XL) 150 MG 24 hr tablet Take 150 mg by mouth every evening.   Taking   Cholecalciferol  (VITAMIN D3) 125 MCG (5000 UT) TABS Take 5,000 Units by mouth in the morning.   Taking   dofetilide  (TIKOSYN ) 500 MCG capsule TAKE 1 CAPSULE BY MOUTH 2 TIMES DAILY. 180 capsule 1 Taking   ELIQUIS  5 MG TABS tablet TAKE 1 TABLET BY MOUTH TWICE A DAY 60 tablet 11 Taking   losartan  (COZAAR ) 50 MG tablet Take 1 tablet (50 mg total) by mouth daily. (Patient taking differently: Take 50 mg by mouth in the morning.) 90 tablet 3 Taking Differently   Multiple Vitamins-Minerals (ICAPS AREDS FORMULA PO) Take 1 tablet by  mouth in the morning and at bedtime.   Taking   Polyethyl Glycol-Propyl Glycol (SYSTANE) 0.4-0.3 % SOLN Place 1-2 drops into both eyes 3 (three) times daily as needed (dry/irritated eyes.).   Taking As Needed   vitamin B-12 (CYANOCOBALAMIN ) 1000 MCG tablet Take 1,000 mcg by mouth in the morning.   Taking   methocarbamol  (ROBAXIN ) 500 MG tablet Take 500 mg by mouth every 6 (six) hours as needed.      ondansetron  (ZOFRAN -ODT) 4 MG disintegrating tablet Take 4 mg by mouth every 6 (six) hours as needed.      oxyCODONE  (OXY IR/ROXICODONE ) 5 MG immediate release tablet Take 5 mg by mouth every 4 (four) hours as needed.      Allergies  Allergen Reactions   Benazepril Cough   Adhesive [Tape] Rash and Other (See Comments)    bandaides   Sulfa Antibiotics Rash    Social History   Tobacco Use   Smoking status: Never   Smokeless tobacco: Never   Tobacco comments:    Never smoke 03/15/22  Substance Use Topics   Alcohol  use: Yes    Alcohol /week: 1.0 standard drink of  alcohol     Types: 1 Standard drinks or equivalent per week    Comment: Holidays    Family History  Problem Relation Age of Onset   Heart disease Mother    Stomach cancer Paternal Grandfather    Colon cancer Neg Hx      Review of Systems  Constitutional:  Negative for chills and fever.  Respiratory:  Negative for cough and shortness of breath.   Cardiovascular:  Negative for chest pain.  Gastrointestinal:  Negative for nausea and vomiting.  Musculoskeletal:  Positive for arthralgias.     Objective:  Physical Exam Well nourished and well developed. General: Alert and oriented x3, cooperative and pleasant, no acute distress.  Musculoskeletal: Right Knee: Well healed arthroplasty scar Full extension, with flexion past 110 degrees No signs of infection  Left Knee: No erythema, warmth, or effusion Significant audible crepitus with passive extension Lacks about 5 degrees of extension, flexes past 110 Tender to  palpation about the medial and lateral joint lines  Vital signs in last 24 hours:    Labs:   Estimated body mass index is 35.78 kg/m as calculated from the following:   Height as of 01/11/24: 6' 1 (1.854 m).   Weight as of 01/11/24: 123 kg.   Imaging Review Plain radiographs demonstrate severe degenerative joint disease of the left knee(s). The overall alignment isneutral. The bone quality appears to be adequate for age and reported activity level.      Assessment/Plan:  End stage arthritis, left knee   The patient history, physical examination, clinical judgment of the provider and imaging studies are consistent with end stage degenerative joint disease of the left knee(s) and total knee arthroplasty is deemed medically necessary. The treatment options including medical management, injection therapy arthroscopy and arthroplasty were discussed at length. The risks and benefits of total knee arthroplasty were presented and reviewed. The risks due to aseptic loosening, infection, stiffness, patella tracking problems, thromboembolic complications and other imponderables were discussed. The patient acknowledged the explanation, agreed to proceed with the plan and consent was signed. Patient is being admitted for inpatient treatment for surgery, pain control, PT, OT, prophylactic antibiotics, VTE prophylaxis, progressive ambulation and ADL's and discharge planning. The patient is planning to be discharged home.     Patient's anticipated LOS is less than 2 midnights, meeting these requirements: - Younger than 80 - Lives within 1 hour of care - Has a competent adult at home to recover with post-op recover - NO history of  - Chronic pain requiring opiods  - Diabetes  - Coronary Artery Disease  - Heart failure  - Heart attack  - Stroke  - DVT/VTE  - Cardiac arrhythmia  - Respiratory Failure/COPD  - Renal failure  - Anemia  - Advanced Liver disease  Rosina Calin,  PA-C Orthopedic Surgery [facility] [place] [phone]

## 2024-01-17 ENCOUNTER — Observation Stay (HOSPITAL_COMMUNITY)
Admission: RE | Admit: 2024-01-17 | Discharge: 2024-01-18 | Disposition: A | Discharge status: Home / Self Care | Attending: Orthopedic Surgery | Admitting: Orthopedic Surgery

## 2024-01-17 ENCOUNTER — Ambulatory Visit (HOSPITAL_COMMUNITY): Admitting: Anesthesiology

## 2024-01-17 ENCOUNTER — Ambulatory Visit (HOSPITAL_COMMUNITY): Payer: Self-pay | Admitting: Physician Assistant

## 2024-01-17 ENCOUNTER — Encounter (HOSPITAL_COMMUNITY)
Admission: RE | Disposition: A | Discharge status: Home / Self Care | Payer: Self-pay | Source: Home / Self Care | Attending: Orthopedic Surgery

## 2024-01-17 ENCOUNTER — Encounter (HOSPITAL_COMMUNITY): Payer: Self-pay | Admitting: Orthopedic Surgery

## 2024-01-17 ENCOUNTER — Other Ambulatory Visit: Payer: Self-pay

## 2024-01-17 DIAGNOSIS — M1712 Unilateral primary osteoarthritis, left knee: Secondary | ICD-10-CM

## 2024-01-17 DIAGNOSIS — I4891 Unspecified atrial fibrillation: Secondary | ICD-10-CM | POA: Insufficient documentation

## 2024-01-17 DIAGNOSIS — I10 Essential (primary) hypertension: Secondary | ICD-10-CM | POA: Insufficient documentation

## 2024-01-17 DIAGNOSIS — Z85828 Personal history of other malignant neoplasm of skin: Secondary | ICD-10-CM | POA: Diagnosis not present

## 2024-01-17 DIAGNOSIS — Z96652 Presence of left artificial knee joint: Principal | ICD-10-CM

## 2024-01-17 DIAGNOSIS — Z96651 Presence of right artificial knee joint: Secondary | ICD-10-CM | POA: Diagnosis not present

## 2024-01-17 DIAGNOSIS — Z7901 Long term (current) use of anticoagulants: Secondary | ICD-10-CM | POA: Insufficient documentation

## 2024-01-17 DIAGNOSIS — G8918 Other acute postprocedural pain: Secondary | ICD-10-CM | POA: Diagnosis not present

## 2024-01-17 DIAGNOSIS — I480 Paroxysmal atrial fibrillation: Secondary | ICD-10-CM | POA: Diagnosis not present

## 2024-01-17 DIAGNOSIS — G4733 Obstructive sleep apnea (adult) (pediatric): Secondary | ICD-10-CM | POA: Diagnosis not present

## 2024-01-17 DIAGNOSIS — Z79899 Other long term (current) drug therapy: Secondary | ICD-10-CM | POA: Insufficient documentation

## 2024-01-17 HISTORY — PX: TOTAL KNEE ARTHROPLASTY: SHX125

## 2024-01-17 SURGERY — ARTHROPLASTY, KNEE, TOTAL
Anesthesia: Monitor Anesthesia Care | Site: Knee | Laterality: Left

## 2024-01-17 MED ORDER — ACETAMINOPHEN 500 MG PO TABS
1000.0000 mg | ORAL_TABLET | Freq: Four times a day (QID) | ORAL | Status: DC
  Administered 2024-01-17 – 2024-01-18 (×4): 1000 mg via ORAL
  Filled 2024-01-17 (×4): qty 2

## 2024-01-17 MED ORDER — METHOCARBAMOL 1000 MG/10ML IJ SOLN: 500.0000 mg | Freq: Four times a day (QID) | INTRAMUSCULAR | Status: DC | PRN

## 2024-01-17 MED ORDER — CEFAZOLIN SODIUM-DEXTROSE 3-4 GM/150ML-% IV SOLN
3.0000 g | INTRAVENOUS | Status: AC
Start: 2024-01-17 — End: 2024-01-17
  Administered 2024-01-17: 3 g via INTRAVENOUS
  Filled 2024-01-17: qty 150

## 2024-01-17 MED ORDER — HYDROMORPHONE HCL 1 MG/ML IJ SOLN: 0.5000 mg | INTRAMUSCULAR | Status: DC | PRN

## 2024-01-17 MED ORDER — METOCLOPRAMIDE HCL 5 MG/ML IJ SOLN: 5.0000 mg | Freq: Three times a day (TID) | INTRAMUSCULAR | Status: DC | PRN

## 2024-01-17 MED ORDER — ONDANSETRON HCL 4 MG/2ML IJ SOLN
INTRAMUSCULAR | Status: DC | PRN
  Administered 2024-01-17: 4 mg via INTRAVENOUS

## 2024-01-17 MED ORDER — ALUM & MAG HYDROXIDE-SIMETH 200-200-20 MG/5ML PO SUSP: 30.0000 mL | ORAL | Status: DC | PRN

## 2024-01-17 MED ORDER — LACTATED RINGERS IV SOLN: INTRAVENOUS | Status: DC

## 2024-01-17 MED ORDER — ONDANSETRON HCL 4 MG PO TABS: 4.0000 mg | ORAL_TABLET | Freq: Four times a day (QID) | ORAL | Status: DC | PRN

## 2024-01-17 MED ORDER — FENTANYL CITRATE PF 50 MCG/ML IJ SOSY: 25.0000 ug | PREFILLED_SYRINGE | Freq: Once | INTRAMUSCULAR | Status: DC

## 2024-01-17 MED ORDER — DOFETILIDE 250 MCG PO CAPS
500.0000 ug | ORAL_CAPSULE | Freq: Two times a day (BID) | ORAL | Status: DC
  Administered 2024-01-17 – 2024-01-18 (×2): 500 ug via ORAL
  Filled 2024-01-17 (×2): qty 2

## 2024-01-17 MED ORDER — TRANEXAMIC ACID-NACL 1000-0.7 MG/100ML-% IV SOLN
1000.0000 mg | INTRAVENOUS | Status: AC
  Administered 2024-01-17: 1000 mg via INTRAVENOUS
  Filled 2024-01-17: qty 100

## 2024-01-17 MED ORDER — SODIUM CHLORIDE 0.9 % IR SOLN
Status: DC | PRN
  Administered 2024-01-17: 1000 mL

## 2024-01-17 MED ORDER — ALPRAZOLAM 0.25 MG PO TABS: 0.2500 mg | ORAL_TABLET | Freq: Two times a day (BID) | ORAL | Status: DC | PRN

## 2024-01-17 MED ORDER — ONDANSETRON HCL 4 MG/2ML IJ SOLN: 4.0000 mg | Freq: Four times a day (QID) | INTRAMUSCULAR | Status: DC | PRN

## 2024-01-17 MED ORDER — FENTANYL CITRATE PF 50 MCG/ML IJ SOSY: 25.0000 ug | PREFILLED_SYRINGE | INTRAMUSCULAR | Status: DC | PRN

## 2024-01-17 MED ORDER — CHLORHEXIDINE GLUCONATE 0.12 % MT SOLN
15.0000 mL | Freq: Once | OROMUCOSAL | Status: AC
  Administered 2024-01-17: 15 mL via OROMUCOSAL

## 2024-01-17 MED ORDER — 0.9 % SODIUM CHLORIDE (POUR BTL) OPTIME
TOPICAL | Status: DC | PRN
Start: 2024-01-17 — End: 2024-01-17
  Administered 2024-01-17: 1000 mL

## 2024-01-17 MED ORDER — POVIDONE-IODINE 10 % EX SWAB: 2.0000 | Freq: Once | CUTANEOUS | Status: DC

## 2024-01-17 MED ORDER — ORAL CARE MOUTH RINSE: 15.0000 mL | Freq: Once | OROMUCOSAL | Status: AC

## 2024-01-17 MED ORDER — SODIUM CHLORIDE 0.9 % IV SOLN: INTRAVENOUS | Status: DC

## 2024-01-17 MED ORDER — ASPIRIN 81 MG PO CHEW: 81.0000 mg | CHEWABLE_TABLET | Freq: Two times a day (BID) | ORAL | Status: DC

## 2024-01-17 MED ORDER — PHENOL 1.4 % MT LIQD
1.0000 | OROMUCOSAL | Status: DC | PRN
Start: 2024-01-17 — End: 2024-01-18

## 2024-01-17 MED ORDER — BUPROPION HCL ER (XL) 150 MG PO TB24
150.0000 mg | ORAL_TABLET | Freq: Every evening | ORAL | Status: DC
  Administered 2024-01-17: 150 mg via ORAL
  Filled 2024-01-17: qty 1

## 2024-01-17 MED ORDER — APIXABAN 5 MG PO TABS
5.0000 mg | ORAL_TABLET | Freq: Two times a day (BID) | ORAL | Status: DC
  Administered 2024-01-18: 5 mg via ORAL
  Filled 2024-01-17: qty 1

## 2024-01-17 MED ORDER — TRANEXAMIC ACID-NACL 1000-0.7 MG/100ML-% IV SOLN
1000.0000 mg | Freq: Once | INTRAVENOUS | Status: AC
  Administered 2024-01-17: 1000 mg via INTRAVENOUS
  Filled 2024-01-17: qty 100

## 2024-01-17 MED ORDER — DEXAMETHASONE SODIUM PHOSPHATE 10 MG/ML IJ SOLN
INTRAMUSCULAR | Status: AC
  Filled 2024-01-17: qty 1

## 2024-01-17 MED ORDER — LIDOCAINE HCL (PF) 2 % IJ SOLN
INTRAMUSCULAR | Status: AC
Start: 2024-01-17 — End: 2024-01-17
  Filled 2024-01-17: qty 5

## 2024-01-17 MED ORDER — MENTHOL 3 MG MT LOZG
1.0000 | LOZENGE | OROMUCOSAL | Status: DC | PRN
  Administered 2024-01-17: 3 mg via ORAL
  Filled 2024-01-17: qty 9

## 2024-01-17 MED ORDER — BISACODYL 10 MG RE SUPP: 10.0000 mg | Freq: Every day | RECTAL | Status: DC | PRN

## 2024-01-17 MED ORDER — DEXAMETHASONE SODIUM PHOSPHATE 10 MG/ML IJ SOLN: 8.0000 mg | Freq: Once | INTRAMUSCULAR | Status: DC

## 2024-01-17 MED ORDER — MIDAZOLAM HCL 2 MG/2ML IJ SOLN
INTRAMUSCULAR | Status: AC
  Filled 2024-01-17: qty 2

## 2024-01-17 MED ORDER — DEXAMETHASONE SODIUM PHOSPHATE 10 MG/ML IJ SOLN
INTRAMUSCULAR | Status: DC | PRN
  Administered 2024-01-17: 5 mg via INTRAVENOUS

## 2024-01-17 MED ORDER — PROPOFOL 500 MG/50ML IV EMUL
INTRAVENOUS | Status: DC | PRN
  Administered 2024-01-17: 101.6 ug/kg/min via INTRAVENOUS
  Administered 2024-01-17: 15 mg via INTRAVENOUS
  Administered 2024-01-17: 30 mg via INTRAVENOUS
  Administered 2024-01-17: 20 mg via INTRAVENOUS
  Administered 2024-01-17: 15 mg via INTRAVENOUS

## 2024-01-17 MED ORDER — POLYETHYLENE GLYCOL 3350 17 G PO PACK
17.0000 g | PACK | Freq: Two times a day (BID) | ORAL | Status: DC
  Administered 2024-01-17 – 2024-01-18 (×3): 17 g via ORAL
  Filled 2024-01-17 (×3): qty 1

## 2024-01-17 MED ORDER — ROPIVACAINE HCL 5 MG/ML IJ SOLN
INTRAMUSCULAR | Status: DC | PRN
  Administered 2024-01-17: 30 mL via PERINEURAL

## 2024-01-17 MED ORDER — DEXAMETHASONE SODIUM PHOSPHATE 10 MG/ML IJ SOLN
INTRAMUSCULAR | Status: DC | PRN
  Administered 2024-01-17: 10 mg

## 2024-01-17 MED ORDER — ONDANSETRON HCL 4 MG/2ML IJ SOLN
INTRAMUSCULAR | Status: AC
  Filled 2024-01-17: qty 2

## 2024-01-17 MED ORDER — PHENYLEPHRINE 80 MCG/ML (10ML) SYRINGE FOR IV PUSH (FOR BLOOD PRESSURE SUPPORT)
PREFILLED_SYRINGE | INTRAVENOUS | Status: DC | PRN
  Administered 2024-01-17: 80 ug via INTRAVENOUS
  Administered 2024-01-17: 120 ug via INTRAVENOUS
  Administered 2024-01-17: 80 ug via INTRAVENOUS
  Administered 2024-01-17: 120 ug via INTRAVENOUS

## 2024-01-17 MED ORDER — OXYCODONE HCL 5 MG PO TABS: 10.0000 mg | ORAL_TABLET | ORAL | Status: DC | PRN

## 2024-01-17 MED ORDER — CEFAZOLIN SODIUM-DEXTROSE 2-4 GM/100ML-% IV SOLN
2.0000 g | Freq: Four times a day (QID) | INTRAVENOUS | Status: AC
  Administered 2024-01-17 (×2): 2 g via INTRAVENOUS
  Filled 2024-01-17 (×2): qty 100

## 2024-01-17 MED ORDER — SODIUM CHLORIDE (PF) 0.9 % IJ SOLN
INTRAMUSCULAR | Status: DC | PRN
  Administered 2024-01-17: 61 mL

## 2024-01-17 MED ORDER — DIPHENHYDRAMINE HCL 12.5 MG/5ML PO ELIX: 12.5000 mg | ORAL_SOLUTION | ORAL | Status: DC | PRN

## 2024-01-17 MED ORDER — BUPIVACAINE IN DEXTROSE 0.75-8.25 % IT SOLN
INTRATHECAL | Status: DC | PRN
  Administered 2024-01-17: 1.8 mL via INTRATHECAL

## 2024-01-17 MED ORDER — MIDAZOLAM HCL 5 MG/5ML IJ SOLN
INTRAMUSCULAR | Status: DC | PRN
  Administered 2024-01-17 (×2): 1 mg via INTRAVENOUS

## 2024-01-17 MED ORDER — LOSARTAN POTASSIUM 50 MG PO TABS
50.0000 mg | ORAL_TABLET | Freq: Every morning | ORAL | Status: DC
  Filled 2024-01-17: qty 1

## 2024-01-17 MED ORDER — MIDAZOLAM HCL 2 MG/2ML IJ SOLN: 0.5000 mg | Freq: Once | INTRAMUSCULAR | Status: DC

## 2024-01-17 MED ORDER — ACETAMINOPHEN 10 MG/ML IV SOLN
1000.0000 mg | Freq: Once | INTRAVENOUS | Status: DC | PRN
Start: 2024-01-17 — End: 2024-01-17

## 2024-01-17 MED ORDER — METHOCARBAMOL 500 MG PO TABS
500.0000 mg | ORAL_TABLET | Freq: Four times a day (QID) | ORAL | Status: DC | PRN
  Administered 2024-01-17 – 2024-01-18 (×3): 500 mg via ORAL
  Filled 2024-01-17 (×3): qty 1

## 2024-01-17 MED ORDER — LIDOCAINE HCL (PF) 2 % IJ SOLN
INTRAMUSCULAR | Status: DC | PRN
  Administered 2024-01-17: 30 mg via INTRADERMAL

## 2024-01-17 MED ORDER — SENNA 8.6 MG PO TABS
2.0000 | ORAL_TABLET | Freq: Every day | ORAL | Status: DC
  Administered 2024-01-17: 17.2 mg via ORAL
  Filled 2024-01-17: qty 2

## 2024-01-17 MED ORDER — PROPOFOL 1000 MG/100ML IV EMUL
INTRAVENOUS | Status: AC
Start: 2024-01-17 — End: 2024-01-17
  Filled 2024-01-17: qty 100

## 2024-01-17 MED ORDER — OXYCODONE HCL 5 MG PO TABS
5.0000 mg | ORAL_TABLET | ORAL | Status: DC | PRN
  Administered 2024-01-17 – 2024-01-18 (×3): 5 mg via ORAL
  Filled 2024-01-17 (×3): qty 1

## 2024-01-17 MED ORDER — DEXAMETHASONE SODIUM PHOSPHATE 10 MG/ML IJ SOLN
10.0000 mg | Freq: Once | INTRAMUSCULAR | Status: AC
  Administered 2024-01-18: 10 mg via INTRAVENOUS
  Filled 2024-01-17: qty 1

## 2024-01-17 MED ORDER — METOCLOPRAMIDE HCL 5 MG PO TABS: 5.0000 mg | ORAL_TABLET | Freq: Three times a day (TID) | ORAL | Status: DC | PRN

## 2024-01-17 SURGICAL SUPPLY — 46 items
ATTUNE MED ANAT PAT 38 KNEE (Knees) IMPLANT
ATTUNE PSFEM LTSZ6 NARCEM KNEE (Femur) IMPLANT
ATTUNE PSRP INSR SZ6 7 KNEE (Insert) IMPLANT
BAG COUNTER SPONGE SURGICOUNT (BAG) IMPLANT
BAG ZIPLOCK 12X15 (MISCELLANEOUS) ×1 IMPLANT
BASE TIBIAL ROT PLAT SZ 5 KNEE (Knees) IMPLANT
BLADE SAW SGTL 13.0X1.19X90.0M (BLADE) ×1 IMPLANT
BNDG ELASTIC 6INX 5YD STR LF (GAUZE/BANDAGES/DRESSINGS) ×1 IMPLANT
BOWL SMART MIX CTS (DISPOSABLE) ×1 IMPLANT
CEMENT HV SMART SET (Cement) ×2 IMPLANT
COVER SURGICAL LIGHT HANDLE (MISCELLANEOUS) ×1 IMPLANT
CUFF TRNQT CYL 34X4.125X (TOURNIQUET CUFF) ×1 IMPLANT
DERMABOND ADVANCED .7 DNX12 (GAUZE/BANDAGES/DRESSINGS) ×1 IMPLANT
DRAPE U-SHAPE 47X51 STRL (DRAPES) ×1 IMPLANT
DRESSING AQUACEL AG SP 3.5X10 (GAUZE/BANDAGES/DRESSINGS) ×1 IMPLANT
DRSG AQUACEL AG ADV 3.5X14 (GAUZE/BANDAGES/DRESSINGS) IMPLANT
DURAPREP 26ML APPLICATOR (WOUND CARE) ×2 IMPLANT
ELECT REM PT RETURN 15FT ADLT (MISCELLANEOUS) ×1 IMPLANT
GLOVE BIO SURGEON STRL SZ 6 (GLOVE) ×1 IMPLANT
GLOVE BIOGEL PI IND STRL 6.5 (GLOVE) ×1 IMPLANT
GLOVE BIOGEL PI IND STRL 7.5 (GLOVE) ×1 IMPLANT
GLOVE ORTHO TXT STRL SZ7.5 (GLOVE) ×2 IMPLANT
GOWN STRL REUS W/ TWL LRG LVL3 (GOWN DISPOSABLE) ×2 IMPLANT
HOLDER FOLEY CATH W/STRAP (MISCELLANEOUS) IMPLANT
KIT TURNOVER KIT A (KITS) ×1 IMPLANT
MANIFOLD NEPTUNE II (INSTRUMENTS) ×1 IMPLANT
NDL SAFETY ECLIPSE 18X1.5 (NEEDLE) IMPLANT
NS IRRIG 1000ML POUR BTL (IV SOLUTION) ×1 IMPLANT
PACK TOTAL KNEE CUSTOM (KITS) ×1 IMPLANT
PENCIL SMOKE EVACUATOR (MISCELLANEOUS) ×1 IMPLANT
PIN FIX SIGMA LCS THRD HI (PIN) IMPLANT
PROTECTOR NERVE ULNAR (MISCELLANEOUS) ×1 IMPLANT
SET HNDPC FAN SPRY TIP SCT (DISPOSABLE) ×1 IMPLANT
SET PAD KNEE POSITIONER (MISCELLANEOUS) ×1 IMPLANT
SPIKE FLUID TRANSFER (MISCELLANEOUS) ×2 IMPLANT
SUT MNCRL AB 4-0 PS2 18 (SUTURE) ×1 IMPLANT
SUT STRATAFIX PDS+ 0 24IN (SUTURE) ×1 IMPLANT
SUT VIC AB 1 CT1 36 (SUTURE) ×1 IMPLANT
SUT VIC AB 2-0 CT1 TAPERPNT 27 (SUTURE) ×2 IMPLANT
SYR 3ML LL SCALE MARK (SYRINGE) ×1 IMPLANT
TOWEL GREEN STERILE FF (TOWEL DISPOSABLE) ×1 IMPLANT
TRAY FOLEY MTR SLVR 14FR STAT (SET/KITS/TRAYS/PACK) IMPLANT
TRAY FOLEY MTR SLVR 16FR STAT (SET/KITS/TRAYS/PACK) ×1 IMPLANT
TUBE SUCTION HIGH CAP CLEAR NV (SUCTIONS) ×1 IMPLANT
WATER STERILE IRR 1000ML POUR (IV SOLUTION) ×2 IMPLANT
WRAP KNEE MAXI GEL POST OP (GAUZE/BANDAGES/DRESSINGS) ×1 IMPLANT

## 2024-01-17 NOTE — Op Note (Signed)
 NAME:  Deborah [name]                      MEDICAL RECORD NO.:  [id number]                             FACILITY:  [facility]      PHYSICIAN:  Deborah BIRCH. [name], M.D.  DATE OF BIRTH:  [birth year]      DATE OF PROCEDURE:  01/17/2024                                     OPERATIVE REPORT         PREOPERATIVE DIAGNOSIS:  Left knee osteoarthritis.      POSTOPERATIVE DIAGNOSIS:  Left knee osteoarthritis.      FINDINGS:  The patient was noted to have complete loss of cartilage and   bone-on-bone arthritis with associated osteophytes in the medial and patellofemoral compartments of   the knee with a significant synovitis and associated effusion.  The patient had failed months of conservative treatment including medications, injection therapy, activity modification.     PROCEDURE:  Left total knee replacement.      COMPONENTS USED:  DePuy Attune rotating platform posterior stabilized knee   system, a size 6N femur, 5 tibia, size 6 mm PS AOX insert, and 38 anatomic patellar   button.      SURGEON:  Deborah BIRCH. [name], M.D.      ASSISTANT:  Rosina Calin, PA-C.      ANESTHESIA:  Regional and Spinal.      SPECIMENS:  None.      COMPLICATION:  None.      DRAINS:  None.  EBL: <200 cc      TOURNIQUET TIME:  35 min at 225 mmHg     The patient was stable to the recovery room.      INDICATION FOR PROCEDURE:  Deborah [name] is a 68 y.o. female patient of   mine.  The patient had been seen, evaluated, and treated for months conservatively in the   office with medication, activity modification, and injections.  The patient had   radiographic changes of bone-on-bone arthritis with endplate sclerosis and osteophytes noted.  Based on the radiographic changes and failed conservative measures, the patient   decided to proceed with definitive treatment, total knee replacement.  Risks of infection, DVT, component failure, need for revision surgery, neurovascular injury were reviewed in the office setting.  The  postop course was reviewed stressing the efforts to maximize post-operative satisfaction and function.  Consent was obtained for benefit of pain   relief.      PROCEDURE IN DETAIL:  The patient was brought to the operative theater.   Once adequate anesthesia, preoperative antibiotics, 2 gm of Ancef ,1 gm of Tranexamic Acid , and 10 mg of Decadron  administered, the patient was positioned supine with a left thigh tourniquet placed.  The  left lower extremity was prepped and draped in sterile fashion.  A time-   out was performed identifying the patient, planned procedure, and the appropriate extremity.      The left lower extremity was placed in the [facility] leg holder.  The leg was   exsanguinated, tourniquet elevated to 225 mmHg.  A midline incision was   made followed by median parapatellar arthrotomy.  Following initial   exposure, attention was  first directed to the patella.  Precut   measurement was noted to be 22 mm.  I resected down to 13-14 mm and used a   38 anatomic patellar button to restore patellar height as well as cover the cut surface.      The lug holes were drilled and a metal shim was placed to protect the   patella from retractors and saw blade during the procedure.      At this point, attention was now directed to the femur.  The femoral   canal was opened with a drill, irrigated to try to prevent fat emboli.  An   intramedullary rod was passed at 3 degrees valgus, 9 mm of bone was   resected off the distal femur.  Following this resection, the tibia was   subluxated anteriorly.  Using the extramedullary guide, 4 mm of bone was resected off   the proximal medial tibia.  We confirmed the gap would be   stable medially and laterally with a size 5 spacer block as well as confirmed that the tibial cut was perpendicular in the coronal plane, checking with an alignment rod.      Once this was done, I sized the femur to be a size 6 in the anterior-   posterior dimension, chose a  standard component based on medial and   lateral dimension.  The size 6 rotation block was then pinned in   position anterior referenced using the C-clamp to set rotation.  The   anterior, posterior, and  chamfer cuts were made without difficulty nor   notching making certain that I was along the anterior cortex to help   with flexion gap stability.      The final box cut was made off the lateral aspect of distal femur.      At this point, the tibia was sized to be a size 5.  The size 5 tray was   then pinned in position through the medial third of the tubercle,   drilled, and keel punched.  Trial reduction was now carried with a 6 femur,  5 tibia, a size 6 mm PS insert, and the 38 anatomic patella botton.  The knee was brought to full extension with good flexion stability with the patella   tracking through the trochlea without application of pressure.  Given   all these findings the trial components removed.  Final components were   opened and cement was mixed.  The knee was irrigated with normal saline solution and pulse lavage.  The synovial lining was   then injected with 30 cc of 0.25% Marcaine  with epinephrine , 1 cc of Toradol and 30 cc of NS for a total of 61 cc.     Final implants were then cemented onto cleaned and dried cut surfaces of bone with the knee brought to extension with a size 6 mm PS trial insert.      Once the cement had fully cured, excess cement was removed   throughout the knee.  I confirmed that I was satisfied with the range of   motion and stability, and the final size 6 mm PS AOX insert was chosen.  It was   placed into the knee.      The tourniquet had been let down at 35 minutes.  No significant   hemostasis was required.  The extensor mechanism was then reapproximated using #1 Vicryl and #1 Stratafix sutures with the knee   in flexion.  The  remaining wound was closed with 2-0 Vicryl and running 4-0 Monocryl.   The knee was cleaned, dried, dressed  sterilely using Dermabond and   Aquacel dressing.  The patient was then   brought to recovery room in stable condition, tolerating the procedure   well.   Please note that Physician Assistant, Rosina Calin, PA-C was present for the entirety of the case, and was utilized for pre-operative positioning, peri-operative retractor management, general facilitation of the procedure and for primary wound closure at the end of the case.              Deborah [name], M.D.    01/17/2024 9:39 AM

## 2024-01-17 NOTE — Progress Notes (Signed)
 Spouse has cpap hose and mask

## 2024-01-17 NOTE — Evaluation (Signed)
 Physical Therapy Evaluation Patient Details Name: Deborah [name] MRN: [id number] DOB: [birth year] Today's Date: 01/17/2024  History of Present Illness  68 yo female presents to therapy s/p L TKA on 01/17/2024 due to failure of conservative measures. Pt PMH includes but is not limited to: R eye macular degeneration, A-fib, OA, OSA, HTN, anemia, anxiety, GERD, PVD, R TKA and multiple cardiac surgeries.  Clinical Impression      Deborah [name] is a 68 y.o. female POD 0 s/p L TKA. Patient reports Mod I with mobility at baseline. Patient is now limited by functional impairments (see PT problem list below) and requires S for bed mobility and CGA and cues for transfers. Patient was able to ambulate 55 feet with RW and CGA level of assist. Patient instructed in exercise to facilitate ROM and circulation to manage edema. Patient will benefit from continued skilled PT interventions to address impairments and progress towards PLOF. Acute PT will follow to progress mobility and stair training in preparation for safe discharge home with family support and OPPT services.     If plan is discharge home, recommend the following: A little help with walking and/or transfers;A little help with bathing/dressing/bathroom;Assistance with cooking/housework;Assist for transportation   Can travel by private vehicle        Equipment Recommendations None recommended by PT  Recommendations for Other Services       Functional Status Assessment Patient has had a recent decline in their functional status and demonstrates the ability to make significant improvements in function in a reasonable and predictable amount of time.     Precautions / Restrictions Precautions Precautions: Knee;Fall Restrictions Weight Bearing Restrictions Per Provider Order: No      Mobility  Bed Mobility Overal bed mobility: Needs Assistance Bed Mobility: Supine to Sit     Supine to sit: Supervision, HOB elevated     General bed  mobility comments: min cues    Transfers Overall transfer level: Needs assistance Equipment used: Rolling walker (2 wheels) Transfers: Sit to/from Stand Sit to Stand: Contact guard assist           General transfer comment: min cues and pt able to push with R UE from EOB    Ambulation/Gait Ambulation/Gait assistance: Contact guard assist Gait Distance (Feet): 55 Feet Assistive device: Rolling walker (2 wheels) Gait Pattern/deviations: Step-to pattern, Decreased stance time - left, Antalgic, Trunk flexed Gait velocity: decreased     General Gait Details: slight trunk flexion with B UE support at RW, with min cues for safety, posture and squencing  Stairs            Wheelchair Mobility     Tilt Bed    Modified Rankin (Stroke Patients Only)       Balance Overall balance assessment: Needs assistance Sitting-balance support: No upper extremity supported, Feet supported Sitting balance-Leahy Scale: Good     Standing balance support: Bilateral upper extremity supported, During functional activity, Reliant on assistive device for balance Standing balance-Leahy Scale: Fair                               Pertinent Vitals/Pain Pain Assessment Pain Assessment: 0-10 Pain Score: 4  Pain Location: L knee and LE Pain Descriptors / Indicators: Aching, Constant, Discomfort, Dull, Operative site guarding Pain Intervention(s): Limited activity within patient's tolerance, Monitored during session, Premedicated before session, Repositioned, Ice applied    Home Living Family/patient expects to be discharged to:: Private  residence Living Arrangements: Spouse/significant other;Children Available Help at Discharge: Family Type of Home: House Home Access: Other (comment) (mechanical lift and use of wc)     Alternate Level Stairs-Number of Steps: flight Home Layout: Two level;Able to live on main level with bedroom/bathroom Home Equipment: Rolling Walker (2  wheels);Cane - single point;Wheelchair - manual      Prior Function Prior Level of Function : Independent/Modified Independent             Mobility Comments: mod I with use of RW or SPC for all ADLs, self care tasks and IADLs       Extremity/Trunk Assessment        Lower Extremity Assessment Lower Extremity Assessment: LLE deficits/detail LLE Deficits / Details: ankle DF 5/5 and PT 4/5; SLR < 10 degree lag LLE Sensation: WNL    Cervical / Trunk Assessment Cervical / Trunk Assessment: Normal  Communication   Communication Communication: No apparent difficulties    Cognition Arousal: Alert Behavior During Therapy: WFL for tasks assessed/performed   PT - Cognitive impairments: No apparent impairments                         Following commands: Intact       Cueing       General Comments      Exercises Total Joint Exercises Ankle Circles/Pumps: AROM, Both, 10 reps   Assessment/Plan    PT Assessment Patient needs continued PT services  PT Problem List Decreased strength;Decreased range of motion;Decreased activity tolerance;Decreased mobility;Decreased balance;Pain       PT Treatment Interventions DME instruction;Gait training;Functional mobility training;Therapeutic activities;Balance training;Therapeutic exercise;Neuromuscular re-education;Patient/family education;Modalities    PT Goals (Current goals can be found in the Care Plan section)  Acute Rehab PT Goals Patient Stated Goal: to get stronger and be able to access B and B on second floor PT Goal Formulation: With patient Time For Goal Achievement: 01/31/24 Potential to Achieve Goals: Good    Frequency 7X/week     Co-evaluation               AM-PAC PT 6 Clicks Mobility  Outcome Measure Help needed turning from your back to your side while in a flat bed without using bedrails?: None Help needed moving from lying on your back to sitting on the side of a flat bed without using  bedrails?: A Little Help needed moving to and from a bed to a chair (including a wheelchair)?: A Little Help needed standing up from a chair using your arms (e.g., wheelchair or bedside chair)?: A Little Help needed to walk in hospital room?: A Little Help needed climbing 3-5 steps with a railing? : Total 6 Click Score: 17    End of Session         PT Visit Diagnosis: Unsteadiness on feet (R26.81);Other abnormalities of gait and mobility (R26.89);Muscle weakness (generalized) (M62.81);Difficulty in walking, not elsewhere classified (R26.2);Pain Pain - Right/Left: Left Pain - part of body: Knee;Leg    Time: 8190-8164 PT Time Calculation (min) (ACUTE ONLY): 26 min   Charges:   PT Evaluation $PT Eval Low Complexity: 1 Low PT Treatments $Gait Training: 8-22 mins PT General Charges $$ ACUTE PT VISIT: 1 Visit         [place], PT Acute Rehab   [place] VEAR Drone 01/17/2024, 6:46 PM

## 2024-01-17 NOTE — Anesthesia Procedure Notes (Signed)
 Procedure Name: MAC Date/Time: 01/17/2024 10:59 AM  Performed by: Erick Fitz, CRNAPre-anesthesia Checklist: Patient identified, Emergency Drugs available, Suction available, Patient being monitored and Timeout performed Patient Re-evaluated:Patient Re-evaluated prior to induction Oxygen Delivery Method: Simple face mask Preoxygenation: Pre-oxygenation with 100% oxygen Induction Type: IV induction Placement Confirmation: positive ETCO2 Dental Injury: Teeth and Oropharynx as per pre-operative assessment

## 2024-01-17 NOTE — Progress Notes (Signed)
   01/17/24 2224  BiPAP/CPAP/SIPAP  BiPAP/CPAP/SIPAP Pt Type Adult  BiPAP/CPAP/SIPAP Resmed (home machine)  Mask Type Nasal pillows  Dentures removed? Not applicable  Respiratory Rate 16 breaths/min  FiO2 (%) 21 %  Heater Temperature  (sterile water added to humidifier)  Patient Home Machine Yes  Safety Check Completed by RT for Home Unit Yes, no issues noted  Patient Home Mask Yes  Patient Home Tubing Yes  CPAP/SIPAP surface wiped down Yes  Device Plugged into RED Power Outlet Yes

## 2024-01-17 NOTE — Plan of Care (Signed)
  Problem: Clinical Measurements: Goal: Ability to maintain clinical measurements within normal limits will improve Outcome: Progressing   Problem: Nutrition: Goal: Adequate nutrition will be maintained Outcome: Progressing   Problem: Coping: Goal: Level of anxiety will decrease Outcome: Progressing   Problem: Pain Managment: Goal: General experience of comfort will improve and/or be controlled Outcome: Progressing   Problem: Pain Management: Goal: Pain level will decrease with appropriate interventions Outcome: Progressing

## 2024-01-17 NOTE — Transfer of Care (Signed)
 Immediate Anesthesia Transfer of Care Note  Patient: Deborah [name]  Procedure(s) Performed: ARTHROPLASTY, KNEE, TOTAL (Left: Knee)  Patient Location: PACU  Anesthesia Type:MAC and Spinal  Level of Consciousness: awake, alert , oriented, and patient cooperative  Airway & Oxygen Therapy: Patient Spontanous Breathing and Patient connected to face mask oxygen  Post-op Assessment: Report given to RN and Post -op Vital signs reviewed and stable  Post vital signs: Reviewed and stable  Last Vitals:  Vitals Value Taken Time  BP 109/68 01/17/24 12:37  Temp    Pulse 63 01/17/24 12:40  Resp 12 01/17/24 12:40  SpO2 97 % 01/17/24 12:40  Vitals shown include unfiled device data.  Last Pain:  Vitals:   01/17/24 0915  TempSrc:   PainSc: 0-No pain      Patients Stated Pain Goal: 4 (01/17/24 0915)  Complications: No notable events documented.

## 2024-01-17 NOTE — Anesthesia Postprocedure Evaluation (Signed)
 Anesthesia Post Note  Patient: Deborah [name]  Procedure(s) Performed: ARTHROPLASTY, KNEE, TOTAL (Left: Knee)     Patient location during evaluation: PACU Anesthesia Type: Regional, MAC and Spinal Level of consciousness: awake and alert Pain management: pain level controlled Vital Signs Assessment: post-procedure vital signs reviewed and stable Respiratory status: spontaneous breathing, nonlabored ventilation, respiratory function stable and patient connected to nasal cannula oxygen Cardiovascular status: stable and blood pressure returned to baseline Postop Assessment: no apparent nausea or vomiting Anesthetic complications: no   No notable events documented.  Last Vitals:  Vitals:   01/17/24 1515 01/17/24 1700  BP: 133/79 129/83  Pulse: (!) 59 78  Resp: 20 18  Temp: 36.4 C 36.4 C  SpO2:  98%    Last Pain:  Vitals:   01/17/24 1706  TempSrc:   PainSc: 2                  Deborah [name]

## 2024-01-17 NOTE — Interval H&P Note (Signed)
 History and Physical Interval Note:  01/17/2024 9:39 AM  Deborah [name]  has presented today for surgery, with the diagnosis of Left knee osteoarthritis.  The various methods of treatment have been discussed with the patient and family. After consideration of risks, benefits and other options for treatment, the patient has consented to  Procedure(s): ARTHROPLASTY, KNEE, TOTAL (Left) as a surgical intervention.  The patient's history has been reviewed, patient examined, no change in status, stable for surgery.  I have reviewed the patient's chart and labs.  Questions were answered to the patient's satisfaction.     [name]

## 2024-01-17 NOTE — Discharge Instructions (Signed)
 Dr. Donnice Car Total Joint Specialist [facility] [place] [address], [address] [place], [place] [address] [phone]  Pain Regimen Instructions:  - Take tylenol  1000 mg every 6-8 hours around the clock  - Take the muscle relaxant around the clock  - Then take 5 mg (1 tablet) of oxycodone  every 4 hours as needed for severe pain  - Ice and elevate your leg as often as you can  Do not take over the counter pain medication until instructed by us .  If this is not controlling your pain, or you need refills - call our office at [phone] or send a message via the [facility] portal.   Take the stool softeners provided until you are having regular bowel movements, and then you may discontinue.     Call your OBGYN for evaluation of possible Bartholin Cyst.   INSTRUCTIONS AFTER JOINT REPLACEMENT   Remove items at home which could result in a fall. This includes throw rugs or furniture in walking pathways ICE to the affected joint every three hours while awake for 30 minutes at a time, for at least the first 3-5 days, and then as needed for pain and swelling.  Continue to use ice for pain and swelling. You may notice swelling that will progress down to the foot and ankle.  This is normal after surgery.  Elevate your leg when you are not up walking on it.   Continue to use the breathing machine you got in the hospital (incentive spirometer) which will help keep your temperature down.  It is common for your temperature to cycle up and down following surgery, especially at night when you are not up moving around and exerting yourself.  The breathing machine keeps your lungs expanded and your temperature down.   DIET:  As you were doing prior to hospitalization, we recommend a well-balanced diet.  DRESSING / WOUND CARE / SHOWERING  Keep the surgical dressing until follow up.  The dressing is water proof, so you can shower without any extra covering.  IF THE DRESSING FALLS OFF  or the wound gets wet inside, change the dressing with sterile gauze.  Please use good hand washing techniques before changing the dressing.  Do not use any lotions or creams on the incision until instructed by your surgeon.    ACTIVITY  Increase activity slowly as tolerated, but follow the weight bearing instructions below.   No driving for 6 weeks or until further direction given by your physician.  You cannot drive while taking narcotics.  No lifting or carrying greater than 10 lbs. until further directed by your surgeon. Avoid periods of inactivity such as sitting longer than an hour when not asleep. This helps prevent blood clots.  You may return to work once you are authorized by your doctor.     WEIGHT BEARING   Weight bearing as tolerated with assist device (walker, cane, etc) as directed, use it as long as suggested by your surgeon or therapist, typically at least 4-6 weeks.   EXERCISES  Results after joint replacement surgery are often greatly improved when you follow the exercise, range of motion and muscle strengthening exercises prescribed by your doctor. Safety measures are also important to protect the joint from further injury. Any time any of these exercises cause you to have increased pain or swelling, decrease what you are doing until you are comfortable again and then slowly increase them. If you have problems or questions, call your caregiver or physical therapist  for advice.   Rehabilitation is important following a joint replacement. After just a few days of immobilization, the muscles of the leg can become weakened and shrink (atrophy).  These exercises are designed to build up the tone and strength of the thigh and leg muscles and to improve motion. Often times heat used for twenty to thirty minutes before working out will loosen up your tissues and help with improving the range of motion but do not use heat for the first two weeks following surgery (sometimes heat can  increase post-operative swelling).   These exercises can be done on a training (exercise) mat, on the floor, on a table or on a bed. Use whatever works the best and is most comfortable for you.    Use music or television while you are exercising so that the exercises are a pleasant break in your day. This will make your life better with the exercises acting as a break in your routine that you can look forward to.   Perform all exercises about fifteen times, three times per day or as directed.  You should exercise both the operative leg and the other leg as well.  Exercises include:   Quad Sets - Tighten up the muscle on the front of the thigh (Quad) and hold for 5-10 seconds.   Straight Leg Raises - With your knee straight (if you were given a brace, keep it on), lift the leg to 60 degrees, hold for 3 seconds, and slowly lower the leg.  Perform this exercise against resistance later as your leg gets stronger.  Leg Slides: Lying on your back, slowly slide your foot toward your buttocks, bending your knee up off the floor (only go as far as is comfortable). Then slowly slide your foot back down until your leg is flat on the floor again.  Angel Wings: Lying on your back spread your legs to the side as far apart as you can without causing discomfort.  Hamstring Strength:  Lying on your back, push your heel against the floor with your leg straight by tightening up the muscles of your buttocks.  Repeat, but this time bend your knee to a comfortable angle, and push your heel against the floor.  You may put a pillow under the heel to make it more comfortable if necessary.   A rehabilitation program following joint replacement surgery can speed recovery and prevent re-injury in the future due to weakened muscles. Contact your doctor or a physical therapist for more information on knee rehabilitation.    CONSTIPATION  Constipation is defined medically as fewer than three stools per week and severe  constipation as less than one stool per week.  Even if you have a regular bowel pattern at home, your normal regimen is likely to be disrupted due to multiple reasons following surgery.  Combination of anesthesia, postoperative narcotics, change in appetite and fluid intake all can affect your bowels.   YOU MUST use at least one of the following options; they are listed in order of increasing strength to get the job done.  They are all available over the counter, and you may need to use some, POSSIBLY even all of these options:    Drink plenty of fluids (prune juice may be helpful) and high fiber foods Colace 100 mg by mouth twice a day  Senokot for constipation as directed and as needed Dulcolax (bisacodyl ), take with full glass of water  Miralax  (polyethylene glycol) once or twice a day as needed.  If you have tried all these things and are unable to have a bowel movement in the first 3-4 days after surgery call either your surgeon or your primary doctor.    If you experience loose stools or diarrhea, hold the medications until you stool forms back up.  If your symptoms do not get better within 1 week or if they get worse, check with your doctor.  If you experience the worst abdominal pain ever or develop nausea or vomiting, please contact the office immediately for further recommendations for treatment.   ITCHING:  If you experience itching with your medications, try taking only a single pain pill, or even half a pain pill at a time.  You can also use Benadryl  over the counter for itching or also to help with sleep.   TED HOSE STOCKINGS:  Use stockings on both legs until for at least 2 weeks or as directed by physician office. They may be removed at night for sleeping.  MEDICATIONS:  See your medication summary on the "After Visit Summary" that nursing will review with you.  You may have some home medications which will be placed on hold until you complete the course of blood thinner  medication.  It is important for you to complete the blood thinner medication as prescribed.  PRECAUTIONS:  If you experience chest pain or shortness of breath - call 911 immediately for transfer to the hospital emergency department.   If you develop a fever greater that 101 F, purulent drainage from wound, increased redness or drainage from wound, foul odor from the wound/dressing, or calf pain - CONTACT YOUR SURGEON.                                                   FOLLOW-UP APPOINTMENTS:  If you do not already have a post-op appointment, please call the office for an appointment to be seen by your surgeon.  Guidelines for how soon to be seen are listed in your "After Visit Summary", but are typically between 1-4 weeks after surgery.  OTHER INSTRUCTIONS:   Knee Replacement:  Do not place pillow under knee, focus on keeping the knee straight while resting. CPM instructions: 0-90 degrees, 2 hours in the morning, 2 hours in the afternoon, and 2 hours in the evening. Place foam block, curve side up under heel at all times except when in CPM or when walking.  DO NOT modify, tear, cut, or change the foam block in any way.  POST-OPERATIVE OPIOID TAPER INSTRUCTIONS: It is important to wean off of your opioid medication as soon as possible. If you do not need pain medication after your surgery it is ok to stop day one. Opioids include: Codeine, Hydrocodone(Norco, Vicodin), Oxycodone (Percocet, oxycontin ) and hydromorphone  amongst others.  Long term and even short term use of opiods can cause: Increased pain response Dependence Constipation Depression Respiratory depression And more.  Withdrawal symptoms can include Flu like symptoms Nausea, vomiting And more Techniques to manage these symptoms Hydrate well Eat regular healthy meals Stay active Use relaxation techniques(deep breathing, meditating, yoga) Do Not substitute Alcohol  to help with tapering If you have been on opioids for less than  two weeks and do not have pain than it is ok to stop all together.  Plan to wean off of opioids This plan should start within one week post op  of your joint replacement. Maintain the same interval or time between taking each dose and first decrease the dose.  Cut the total daily intake of opioids by one tablet each day Next start to increase the time between doses. The last dose that should be eliminated is the evening dose.   MAKE SURE YOU:  Understand these instructions.  Get help right away if you are not doing well or get worse.    Thank you for letting us  be a part of your medical care team.  It is a privilege we respect greatly.  We hope these instructions will help you stay on track for a fast and full recovery!

## 2024-01-17 NOTE — Anesthesia Procedure Notes (Signed)
 Spinal  Patient location during procedure: OR Start time: 01/17/2024 10:54 AM End time: 01/17/2024 10:58 AM Staffing Performed: anesthesiologist  Anesthesiologist: Dorethea Cordella SQUIBB, DO Performed by: Dorethea Cordella SQUIBB, DO Authorized by: Dorethea Cordella SQUIBB, DO   Preanesthetic Checklist Completed: patient identified, IV checked, site marked, risks and benefits discussed, surgical consent, monitors and equipment checked, pre-op evaluation and timeout performed Spinal Block Patient position: sitting Prep: DuraPrep Patient monitoring: heart rate, cardiac monitor, continuous pulse ox and blood pressure Approach: midline Location: L3-4 Injection technique: single-shot Needle Needle type: Pencan  Needle gauge: 24 G Needle length: 10 cm Assessment Events: CSF return Additional Notes Patient identified. Risks/Benefits/Options discussed with patient including but not limited to bleeding, infection, nerve damage, paralysis, failed block, incomplete pain control, headache, blood pressure changes, nausea, vomiting, reactions to medications, itching and postpartum back pain. Confirmed with bedside nurse the patient's most recent platelet count. Confirmed with patient that they are not currently taking any anticoagulation, have any bleeding history or any family history of bleeding disorders. Patient expressed understanding and wished to proceed. All questions were answered. Sterile technique was used throughout the entire procedure. Please see nursing notes for vital signs. Warning signs of high block given to the patient including shortness of breath, tingling/numbness in hands, complete motor block, or any concerning symptoms with instructions to call for help. Patient was given instructions on fall risk and not to get out of bed. All questions and concerns addressed with instructions to call with any issues or inadequate analgesia.

## 2024-01-17 NOTE — Anesthesia Procedure Notes (Signed)
 Anesthesia Regional Block: Adductor canal block   Pre-Anesthetic Checklist: , timeout performed,  Correct Patient, Correct Site, Correct Laterality,  Correct Procedure, Correct Position, site marked,  Risks and benefits discussed,  Surgical consent,  Pre-op evaluation,  At surgeon's request and post-op pain management  Laterality: Left  Prep: Dura Prep       Needles:  Injection technique: Single-shot  Needle Type: Echogenic Stimulator Needle     Needle Length: 10cm  Needle Gauge: 20     Additional Needles:   Procedures:,,,, ultrasound used (permanent image in chart),,    Narrative:  Start time: 01/17/2024 10:24 AM End time: 01/17/2024 10:26 AM Injection made incrementally with aspirations every 5 mL.  Performed by: Personally  Anesthesiologist: Dorethea Cordella SQUIBB, DO  Additional Notes: Patient identified. Risks/Benefits/Options discussed with patient including but not limited to bleeding, infection, nerve damage, failed block, incomplete pain control. Patient expressed understanding and wished to proceed. All questions were answered. Sterile technique was used throughout the entire procedure. Please see nursing notes for vital signs. Aspirated in 5cc intervals with injection for negative confirmation. Patient was given instructions on fall risk and not to get out of bed. All questions and concerns addressed with instructions to call with any issues or inadequate analgesia.

## 2024-01-18 DIAGNOSIS — M1712 Unilateral primary osteoarthritis, left knee: Secondary | ICD-10-CM | POA: Diagnosis not present

## 2024-01-18 LAB — BASIC METABOLIC PANEL WITH GFR
Anion gap: 6 (ref 5–15)
BUN: 16 mg/dL (ref 8–23)
CO2: 24 mmol/L (ref 22–32)
Calcium: 8.5 mg/dL — ABNORMAL LOW (ref 8.9–10.3)
Chloride: 106 mmol/L (ref 98–111)
Creatinine, Ser: 1.32 mg/dL — ABNORMAL HIGH (ref 0.44–1.00)
GFR, Estimated: 44 mL/min — ABNORMAL LOW (ref >60)
Glucose, Bld: 169 mg/dL — ABNORMAL HIGH (ref 70–99)
Potassium: 4.4 mmol/L (ref 3.5–5.1)
Sodium: 136 mmol/L (ref 135–145)

## 2024-01-18 LAB — CBC
HCT: 39 % (ref 36.0–46.0)
Hemoglobin: 11.7 g/dL — ABNORMAL LOW (ref 12.0–15.0)
MCH: 27.5 pg (ref 26.0–34.0)
MCHC: 30 g/dL (ref 30.0–36.0)
MCV: 91.5 fL (ref 80.0–100.0)
Platelets: 254 K/uL (ref 150–400)
RBC: 4.26 MIL/uL (ref 3.87–5.11)
RDW: 14.3 % (ref 11.5–15.5)
WBC: 13.4 K/uL — ABNORMAL HIGH (ref 4.0–10.5)
nRBC: 0 % (ref 0.0–0.2)

## 2024-01-18 NOTE — Progress Notes (Signed)
 Patient sustained skin tear to right 2nd finger while in restroom, wound cleaned and bandaged.

## 2024-01-18 NOTE — Progress Notes (Signed)
 AVS has been given. Patient being discharged

## 2024-01-18 NOTE — TOC Transition Note (Signed)
 Transition of Care [facility]) - Discharge Note   Patient Details  Name: Deborah [name] MRN: [id number] Date of Birth: March 01, 1956  Transition of Care [facility]) CM/SW Contact:  NORMAN ASPEN, LCSW Phone Number: 01/18/2024, 11:12 AM   Clinical Narrative:     Met with pt who confirms she has needed DME in the home.  OPPT already arranged with [facility].  No further TOC needs.  Final next level of care: OP Rehab Barriers to Discharge: No Barriers Identified   Patient Goals and CMS Choice Patient states their goals for this hospitalization and ongoing recovery are:: return home          Discharge Placement                       Discharge Plan and Services Additional resources added to the After Visit Summary for                  DME Arranged: N/A DME Agency: NA                  Social Drivers of Health (SDOH) Interventions SDOH Screenings   Food Insecurity: No Food Insecurity (01/17/2024)  Housing: Low Risk  (01/17/2024)  Transportation Needs: No Transportation Needs (01/17/2024)  Utilities: Not At Risk (01/17/2024)  Social Connections: Socially Integrated (01/17/2024)  Tobacco Use: Low Risk  (01/17/2024)     Readmission Risk Interventions     No data to display

## 2024-01-18 NOTE — Progress Notes (Signed)
 Physical Therapy Treatment Patient Details Name: Deborah [name] MRN: [id number] DOB: January 18, 1956 Today's Date: 01/18/2024   History of Present Illness 68 yo female presents to therapy s/p L TKA on 01/17/2024 due to failure of conservative measures. Pt PMH includes but is not limited to: R eye macular degeneration, A-fib, OA, OSA, HTN, anemia, anxiety, GERD, PVD, R TKA and multiple cardiac surgeries.    PT Comments  POD # 1 am session PT - Cognition Comments: AxO x 3 very pleasant Lady, highly motivated who lives at home with Spouse.  Had her other knee replaced 2017. Pt was OOB in recliner.  Had been up to the bathroom prior.  Assisted with amb in hallway went well.  General Gait Details: tolerated a functional distance with < 25% VC's on safety with turns using walker. Then returned to room to perform some TE's following HEP handout.  Instructed on proper tech, freq as well as use of ICE.   Will see Pt again for a second session to complete HEP Education and increase mobility.    If plan is discharge home, recommend the following: A little help with walking and/or transfers;A little help with bathing/dressing/bathroom;Assistance with cooking/housework;Assist for transportation   Can travel by private vehicle        Equipment Recommendations  None recommended by PT    Recommendations for Other Services       Precautions / Restrictions Precautions Precautions: Knee;Fall Precaution/Restrictions Comments: no pillow under knee Restrictions Weight Bearing Restrictions Per Provider Order: No Other Position/Activity Restrictions: WBAT     Mobility  Bed Mobility               General bed mobility comments: (P) OOB in recliner    Transfers Overall transfer level: Needs assistance Equipment used: Rolling walker (2 wheels) Transfers: Sit to/from Stand Sit to Stand: Supervision, Contact guard assist           General transfer comment: <25% VC's on safety with turns     Ambulation/Gait Ambulation/Gait assistance: Supervision Gait Distance (Feet): 58 Feet Assistive device: Rolling walker (2 wheels) Gait Pattern/deviations: Step-to pattern, Decreased stance time - left, Antalgic, Trunk flexed Gait velocity: decreased     General Gait Details: tolerated a functional distance with < 25% VC's on safety with turns using walker.   Stairs Stairs:  (entry level)           Wheelchair Mobility     Tilt Bed    Modified Rankin (Stroke Patients Only)       Balance                                            Communication Communication Communication: No apparent difficulties  Cognition   Behavior During Therapy: WFL for tasks assessed/performed   PT - Cognitive impairments: No apparent impairments                       PT - Cognition Comments: AxO x 3 very pleasant Lady, highly motivated who lives at home with Spouse.  Had her other knee replaced 2017. Following commands: Intact      Cueing    Exercises  Total Knee Replacement TE's following HEP handout 10 reps B LE ankle pumps 05 reps towel squeezes 05 reps knee presses 05 reps heel slides  05 reps SAQ's 05 reps SLR's 05 reps ABD Educated  on use of gait belt to assist with TE's Followed by ICE     General Comments        Pertinent Vitals/Pain Pain Assessment Pain Assessment: 0-10 Pain Score: 4  Pain Location: L knee Pain Descriptors / Indicators: Aching, Constant, Discomfort, Dull, Operative site guarding Pain Intervention(s): Monitored during session, Premedicated before session, Ice applied    Home Living                          Prior Function            PT Goals (current goals can now be found in the care plan section) Progress towards PT goals: Progressing toward goals    Frequency    7X/week      PT Plan      Co-evaluation              AM-PAC PT 6 Clicks Mobility   Outcome Measure  Help needed  turning from your back to your side while in a flat bed without using bedrails?: A Little Help needed moving from lying on your back to sitting on the side of a flat bed without using bedrails?: A Little Help needed moving to and from a bed to a chair (including a wheelchair)?: A Little Help needed standing up from a chair using your arms (e.g., wheelchair or bedside chair)?: A Little Help needed to walk in hospital room?: A Little Help needed climbing 3-5 steps with a railing? : A Lot 6 Click Score: 17    End of Session Equipment Utilized During Treatment: Gait belt       PT Visit Diagnosis: Unsteadiness on feet (R26.81);Other abnormalities of gait and mobility (R26.89);Muscle weakness (generalized) (M62.81);Difficulty in walking, not elsewhere classified (R26.2);Pain Pain - Right/Left: Left Pain - part of body: Knee;Leg     Time: 8941-8877 PT Time Calculation (min) (ACUTE ONLY): 24 min  Charges:    $Gait Training: 8-22 mins $Therapeutic Exercise: 8-22 mins PT General Charges $$ ACUTE PT VISIT: 1 Visit                     Katheryn Leap  PTA Acute  Rehabilitation Services Office M-F          [phone]

## 2024-01-18 NOTE — Progress Notes (Signed)
 Subjective: 1 Day Post-Op Procedure(s) (LRB): ARTHROPLASTY, KNEE, TOTAL (Left) Patient reports pain as mild.   Patient seen in rounds for Dr. Ernie. Patient is well, and has had no acute complaints or problems. No acute events overnight. Foley catheter removed. Patient ambulated 55 feet with PT.  We will start therapy today.   Objective: Vital signs in last 24 hours: Temp:  [97.3 F (36.3 C)-98 F (36.7 C)] 97.6 F (36.4 C) (07/23 0622) Pulse Rate:  [53-94] 60 (07/23 0622) Resp:  [10-20] 17 (07/23 0622) BP: (109-143)/(68-83) 137/69 (07/23 0622) SpO2:  [95 %-100 %] 96 % (07/23 0622) FiO2 (%):  [21 %] 21 % (07/22 2224) Weight:  [876 kg] 123 kg (07/22 1515)  Intake/Output from previous day:  Intake/Output Summary (Last 24 hours) at 01/18/2024 0911 Last data filed at 01/18/2024 9386 Gross per 24 hour  Intake 1830 ml  Output 2625 ml  Net -795 ml     Intake/Output this shift: No intake/output data recorded.  Labs: Recent Labs    01/18/24 0335  HGB 11.7*   Recent Labs    01/18/24 0335  WBC 13.4*  RBC 4.26  HCT 39.0  PLT 254   Recent Labs    01/18/24 0335  NA 136  K 4.4  CL 106  CO2 24  BUN 16  CREATININE 1.32*  GLUCOSE 169*  CALCIUM  8.5*   No results for input(s): LABPT, INR in the last 72 hours.  Exam: General - Patient is Alert and Oriented Extremity - Neurologically intact Sensation intact distally Intact pulses distally Dorsiflexion/Plantar flexion intact Dressing - dressing C/D/I Motor Function - intact, moving foot and toes well on exam.   Past Medical History:  Diagnosis Date   A-fib (HCC)    Anemia    hx    Anxiety    Arthritis    was in right knee (01/25/2018)   Dysrhythmia    atrial fib/flutter   GERD (gastroesophageal reflux disease)    occ   HTN (hypertension)    Migraine    used to have them more frequently before menopause; now only have a couple/yr (01/25/2018)   Obesity    OSA on CPAP    PAF (paroxysmal atrial  fibrillation) (HCC)    s/p DCCV 2010   Peripheral vascular disease (HCC) 2016   after flying in leg   Skin cancer    Leg    Assessment/Plan: 1 Day Post-Op Procedure(s) (LRB): ARTHROPLASTY, KNEE, TOTAL (Left) Principal Problem:   S/P total knee replacement, left Active Problems:   S/P total knee arthroplasty, left  Estimated body mass index is 35.78 kg/m as calculated from the following:   Height as of this encounter: 6' 1 (1.854 m).   Weight as of this encounter: 123 kg. Advance diet Up with therapy D/C IV fluids   Patient's anticipated LOS is less than 2 midnights, meeting these requirements: - Younger than 69 - Lives within 1 hour of care - Has a competent adult at home to recover with post-op recover - NO history of  - Chronic pain requiring opiods  - Diabetes  - Coronary Artery Disease  - Heart failure  - Heart attack  - Stroke  - DVT/VTE  - Cardiac arrhythmia  - Respiratory Failure/COPD  - Renal failure  - Anemia  - Advanced Liver disease     DVT Prophylaxis - Eliquis  Weight bearing as tolerated.  Hgb stable at 11.7 this AM  I discussed with patient her concerns yesterday about a tender  nodule on her vulva which seemed to be most consistent with a bartholin cyst. She does have an established GYN that I recommended she reach out to about this. She can use warm compresses in the mean time.   Plan is to go Home after hospital stay. Plan for discharge today following 1-2 sessions of PT as long as they are meeting their goals. Patient is scheduled for OPPT. Follow up in the office in 2 weeks.   Rosina Calin, PA-C Orthopedic Surgery [phone] 01/18/2024, 9:11 AM

## 2024-01-18 NOTE — Progress Notes (Signed)
 Physical Therapy Treatment Patient Details Name: Deborah [name] MRN: [id number] DOB: 19-Mar-1956 Today's Date: 01/18/2024   History of Present Illness 68 yo female presents to therapy s/p L TKA on 01/17/2024 due to failure of conservative measures. Pt PMH includes but is not limited to: R eye macular degeneration, A-fib, OA, OSA, HTN, anemia, anxiety, GERD, PVD, R TKA and multiple cardiac surgeries.    PT Comments  POD # 1 pm session PT - Cognition Comments: AxO x 3 very pleasant Lady, highly motivated who lives at home with Spouse.  Had her other knee replaced 2017. Assisted with amb an increased distance of 75 feet with walker.  Performed all seated TE's following HEP handout. Addressed all mobility questions, discussed appropriate activity, educated on use of ICE.  Pt ready for D/C to home. Spouse arriving around 1:30pm.  Reported to RN.      If plan is discharge home, recommend the following: A little help with walking and/or transfers;A little help with bathing/dressing/bathroom;Assistance with cooking/housework;Assist for transportation   Can travel by private vehicle        Equipment Recommendations  None recommended by PT    Recommendations for Other Services       Precautions / Restrictions Precautions Precautions: Knee;Fall Precaution/Restrictions Comments: no pillow under knee Restrictions Weight Bearing Restrictions Per Provider Order: No Other Position/Activity Restrictions: WBAT     Mobility  Bed Mobility               General bed mobility comments: (P) OOB in recliner    Transfers Overall transfer level: Needs assistance Equipment used: Rolling walker (2 wheels) Transfers: Sit to/from Stand Sit to Stand: Supervision, Contact guard assist           General transfer comment: <25% VC's on safety with turns    Ambulation/Gait Ambulation/Gait assistance: Supervision Gait Distance (Feet): 75 Feet Assistive device: Rolling walker (2 wheels) Gait  Pattern/deviations: Step-to pattern, Decreased stance time - left, Antalgic, Trunk flexed Gait velocity: decreased     General Gait Details: tolerated an increased distance 75 feet with < 25% VC's on safety with turns using walker.   Stairs Stairs:  (entry level)           Wheelchair Mobility     Tilt Bed    Modified Rankin (Stroke Patients Only)       Balance                                            Communication Communication Communication: No apparent difficulties  Cognition   Behavior During Therapy: WFL for tasks assessed/performed   PT - Cognitive impairments: No apparent impairments                       PT - Cognition Comments: AxO x 3 very pleasant Lady, highly motivated who lives at home with Spouse.  Had her other knee replaced 2017. Following commands: Intact      Cueing    Exercises  05 reps seated TE's following TKR HEP handout    General Comments        Pertinent Vitals/Pain Pain Assessment Pain Assessment: 0-10 Pain Score: 4  Pain Location: L knee Pain Descriptors / Indicators: Aching, Constant, Discomfort, Dull, Operative site guarding Pain Intervention(s): Monitored during session, Premedicated before session, Ice applied    Home Living  Prior Function            PT Goals (current goals can now be found in the care plan section) Progress towards PT goals: Progressing toward goals    Frequency    7X/week      PT Plan      Co-evaluation              AM-PAC PT 6 Clicks Mobility   Outcome Measure  Help needed turning from your back to your side while in a flat bed without using bedrails?: A Little Help needed moving from lying on your back to sitting on the side of a flat bed without using bedrails?: A Little Help needed moving to and from a bed to a chair (including a wheelchair)?: A Little Help needed standing up from a chair using your arms  (e.g., wheelchair or bedside chair)?: A Little Help needed to walk in hospital room?: A Little Help needed climbing 3-5 steps with a railing? : A Lot 6 Click Score: 17    End of Session Equipment Utilized During Treatment: Gait belt       PT Visit Diagnosis: Unsteadiness on feet (R26.81);Other abnormalities of gait and mobility (R26.89);Muscle weakness (generalized) (M62.81);Difficulty in walking, not elsewhere classified (R26.2);Pain Pain - Right/Left: Left Pain - part of body: Knee;Leg     Time: 8846-8782 PT Time Calculation (min) (ACUTE ONLY): 24 min  Charges:    $Gait Training: 8-22 mins $Therapeutic Exercise: 8-22 mins PT General Charges $$ ACUTE PT VISIT: 1 Visit                     {Tiersa Dayley  PTA Acute  Rehabilitation Services Office M-F          [phone]

## 2024-01-18 NOTE — Care Management Obs Status (Signed)
 MEDICARE OBSERVATION STATUS NOTIFICATION   Patient Details  Name: DESIRE FULP MRN: [id number] Date of Birth: 14-Nov-1955   Medicare Observation Status Notification Given:  Chaney NORMAN ASPEN, LCSW 01/18/2024, 11:43 AM

## 2024-01-19 ENCOUNTER — Encounter (HOSPITAL_COMMUNITY): Payer: Self-pay | Admitting: Orthopedic Surgery

## 2024-01-20 DIAGNOSIS — M25562 Pain in left knee: Secondary | ICD-10-CM | POA: Diagnosis not present

## 2024-01-23 DIAGNOSIS — M25562 Pain in left knee: Secondary | ICD-10-CM | POA: Diagnosis not present

## 2024-01-24 NOTE — Discharge Summary (Signed)
 Patient ID: DYNASTIE KNOOP MRN: [id number] DOB/AGE: 69-Oct-1957 69 y.o.  Admit date: 01/17/2024 Discharge date: 01/18/2024  Admission Diagnoses:  Left knee osteoarthritis  Discharge Diagnoses:  Principal Problem:   S/P total knee replacement, left Active Problems:   S/P total knee arthroplasty, left   Past Medical History:  Diagnosis Date   A-fib (HCC)    Anemia    hx    Anxiety    Arthritis    was in right knee (01/25/2018)   Dysrhythmia    atrial fib/flutter   GERD (gastroesophageal reflux disease)    occ   HTN (hypertension)    Migraine    used to have them more frequently before menopause; now only have a couple/yr (01/25/2018)   Obesity    OSA on CPAP    PAF (paroxysmal atrial fibrillation) (HCC)    s/p DCCV 2010   Peripheral vascular disease (HCC) 2016   after flying in leg   Skin cancer    Leg    Surgeries: Procedure(s): ARTHROPLASTY, KNEE, TOTAL on 01/17/2024   Consultants:   Discharged Condition: Improved  Hospital Course: AUTUMN PRUITT is an 68 y.o. female who was admitted 01/17/2024 for operative treatment ofS/P total knee replacement, left. Patient has severe unremitting pain that affects sleep, daily activities, and work/hobbies. After pre-op clearance the patient was taken to the operating room on 01/17/2024 and underwent  Procedure(s): ARTHROPLASTY, KNEE, TOTAL.    Patient was given perioperative antibiotics:  Anti-infectives (From admission, onward)    Start     Dose/Rate Route Frequency Ordered Stop   01/17/24 1700  ceFAZolin  (ANCEF ) IVPB 2g/100 mL premix        2 g 200 mL/hr over 30 Minutes Intravenous Every 6 hours 01/17/24 1322 01/18/24 1248   01/17/24 0900  ceFAZolin  (ANCEF ) IVPB 3g/150 mL premix        3 g 300 mL/hr over 30 Minutes Intravenous On call to O.R. 01/17/24 0856 01/17/24 1108        Patient was given sequential compression devices, early ambulation, and chemoprophylaxis to prevent DVT. Patient worked with PT and was meeting  their goals regarding safe ambulation and transfers.  Patient benefited maximally from hospital stay and there were no complications.    Recent vital signs: No data found.   Recent laboratory studies: No results for input(s): WBC, HGB, HCT, PLT, NA, K, CL, CO2, BUN, CREATININE, GLUCOSE, INR, CALCIUM  in the last 72 hours.  Invalid input(s): PT, 2   Discharge Medications:   Allergies as of 01/18/2024       Reactions   Benazepril Cough   Adhesive [tape] Rash, Other (See Comments)   bandaides   Sulfa Antibiotics Rash        Medication List     TAKE these medications    acetaminophen  500 MG tablet Commonly known as: TYLENOL  Take 500-1,000 mg by mouth every 6 (six) hours as needed (pain.).   buPROPion  150 MG 24 hr tablet Commonly known as: WELLBUTRIN  XL Take 150 mg by mouth every evening.   cyanocobalamin  1000 MCG tablet Commonly known as: VITAMIN B12 Take 1,000 mcg by mouth in the morning.   dofetilide  500 MCG capsule Commonly known as: TIKOSYN  TAKE 1 CAPSULE BY MOUTH 2 TIMES DAILY.   Eliquis  5 MG Tabs tablet Generic drug: apixaban  TAKE 1 TABLET BY MOUTH TWICE A DAY   ICAPS AREDS FORMULA PO Take 1 tablet by mouth in the morning and at bedtime.   losartan  50 MG tablet Commonly known  as: COZAAR  Take 1 tablet (50 mg total) by mouth daily. What changed: when to take this   methocarbamol  500 MG tablet Commonly known as: ROBAXIN  Take 500 mg by mouth every 6 (six) hours as needed.   ondansetron  4 MG disintegrating tablet Commonly known as: ZOFRAN -ODT Take 4 mg by mouth every 6 (six) hours as needed.   oxyCODONE  5 MG immediate release tablet Commonly known as: Oxy IR/ROXICODONE  Take 5 mg by mouth every 4 (four) hours as needed.   Systane 0.4-0.3 % Soln Generic drug: Polyethyl Glycol-Propyl Glycol Place 1-2 drops into both eyes 3 (three) times daily as needed (dry/irritated eyes.).   Vitamin D3 125 MCG (5000 UT) Tabs Take 5,000  Units by mouth in the morning.   Xanax  0.5 MG tablet Generic drug: ALPRAZolam  Take 0.25-1 mg by mouth 2 (two) times daily as needed for anxiety.               Discharge Care Instructions  (From admission, onward)           Start     Ordered   01/18/24 0000  Change dressing       Comments: Maintain surgical dressing until follow up in the clinic. If the edges start to pull up, may reinforce with tape. If the dressing is no longer working, may remove and cover with gauze and tape, but must keep the area dry and clean.  Call with any questions or concerns.   01/18/24 0914            Diagnostic Studies: No results found.  Disposition: Discharge disposition: 01-Home or Self Care       Discharge Instructions     Call MD / Call 911   Complete by: As directed    If you experience chest pain or shortness of breath, CALL 911 and be transported to the hospital emergency room.  If you develope a fever above 101 F, pus (white drainage) or increased drainage or redness at the wound, or calf pain, call your surgeon's office.   Change dressing   Complete by: As directed    Maintain surgical dressing until follow up in the clinic. If the edges start to pull up, may reinforce with tape. If the dressing is no longer working, may remove and cover with gauze and tape, but must keep the area dry and clean.  Call with any questions or concerns.   Constipation Prevention   Complete by: As directed    Drink plenty of fluids.  Prune juice may be helpful.  You may use a stool softener, such as Colace (over the counter) 100 mg twice a day.  Use MiraLax  (over the counter) for constipation as needed.   Diet - low sodium heart healthy   Complete by: As directed    Increase activity slowly as tolerated   Complete by: As directed    Weight bearing as tolerated with assist device (walker, cane, etc) as directed, use it as long as suggested by your surgeon or therapist, typically at least 4-6  weeks.   Post-operative opioid taper instructions:   Complete by: As directed    POST-OPERATIVE OPIOID TAPER INSTRUCTIONS: It is important to wean off of your opioid medication as soon as possible. If you do not need pain medication after your surgery it is ok to stop day one. Opioids include: Codeine, Hydrocodone(Norco, Vicodin), Oxycodone (Percocet, oxycontin ) and hydromorphone  amongst others.  Long term and even short term use of opiods can cause: Increased pain response  Dependence Constipation Depression Respiratory depression And more.  Withdrawal symptoms can include Flu like symptoms Nausea, vomiting And more Techniques to manage these symptoms Hydrate well Eat regular healthy meals Stay active Use relaxation techniques(deep breathing, meditating, yoga) Do Not substitute Alcohol  to help with tapering If you have been on opioids for less than two weeks and do not have pain than it is ok to stop all together.  Plan to wean off of opioids This plan should start within one week post op of your joint replacement. Maintain the same interval or time between taking each dose and first decrease the dose.  Cut the total daily intake of opioids by one tablet each day Next start to increase the time between doses. The last dose that should be eliminated is the evening dose.      TED hose   Complete by: As directed    Use stockings (TED hose) for 2 weeks on both leg(s).  You may remove them at night for sleeping.        Follow-up Information     Ernie Cough, MD Follow up in 2 week(s).   Specialty: Orthopedic Surgery Contact information: [address] [place] [address] [phone]                  Signed: Rosina JONELLE Calin 01/24/2024, 1:22 PM

## 2024-01-25 DIAGNOSIS — M25562 Pain in left knee: Secondary | ICD-10-CM | POA: Diagnosis not present

## 2024-01-27 DIAGNOSIS — G4733 Obstructive sleep apnea (adult) (pediatric): Secondary | ICD-10-CM | POA: Diagnosis not present

## 2024-02-01 DIAGNOSIS — M25562 Pain in left knee: Secondary | ICD-10-CM | POA: Diagnosis not present

## 2024-02-03 DIAGNOSIS — M25562 Pain in left knee: Secondary | ICD-10-CM | POA: Diagnosis not present

## 2024-02-06 DIAGNOSIS — M25562 Pain in left knee: Secondary | ICD-10-CM | POA: Diagnosis not present

## 2024-02-07 ENCOUNTER — Emergency Department (HOSPITAL_BASED_OUTPATIENT_CLINIC_OR_DEPARTMENT_OTHER)
Admission: EM | Admit: 2024-02-07 | Discharge: 2024-02-07 | Disposition: A | Discharge status: Home / Self Care | Attending: Emergency Medicine | Admitting: Emergency Medicine

## 2024-02-07 ENCOUNTER — Other Ambulatory Visit: Payer: Self-pay

## 2024-02-07 ENCOUNTER — Encounter (HOSPITAL_BASED_OUTPATIENT_CLINIC_OR_DEPARTMENT_OTHER): Payer: Self-pay

## 2024-02-07 DIAGNOSIS — Z7901 Long term (current) use of anticoagulants: Secondary | ICD-10-CM | POA: Insufficient documentation

## 2024-02-07 DIAGNOSIS — M542 Cervicalgia: Secondary | ICD-10-CM | POA: Diagnosis not present

## 2024-02-07 MED ORDER — PREDNISONE 20 MG PO TABS
40.0000 mg | ORAL_TABLET | Freq: Once | ORAL | Status: AC
  Administered 2024-02-07 (×2): 40 mg via ORAL
  Filled 2024-02-07: qty 2

## 2024-02-07 MED ORDER — HYDROMORPHONE HCL 1 MG/ML IJ SOLN
2.0000 mg | Freq: Once | INTRAMUSCULAR | Status: AC
  Administered 2024-02-07 (×2): 2 mg via INTRAMUSCULAR
  Filled 2024-02-07: qty 2

## 2024-02-07 MED ORDER — PREDNISONE 10 MG PO TABS: 20.0000 mg | ORAL_TABLET | Freq: Two times a day (BID) | ORAL | 0 refills | Status: AC

## 2024-02-07 NOTE — ED Triage Notes (Signed)
 Pt presents via POV c/o right sided neck pain x2 weeks. Reports had spinal epidural for knee surgery x3 weeks ago and pain started 1 week after surgery. Reports worsening pain with palpation to right side of upper neck. A&O x4. Ambulatory to triage.

## 2024-02-07 NOTE — ED Provider Notes (Signed)
 [place] EMERGENCY DEPARTMENT AT [facility] Provider Note   CSN: [id number] Arrival date & time: 02/07/24  9641     Patient presents with: Torticollis   Deborah [name] is a 68 y.o. female.   Patient is a 68 year old female presenting with complaints of neck pain.  This been ongoing for the past 2 weeks and started after she had a spinal anesthesia for knee surgery.  She has been taking oxycodone  for her knee pain which helps her neck pain somewhat, but the neck pain persists.  She denies any radiation into her arm.  She denies any weakness or numbness.  Pain is worse with movement and turning her head.         Prior to Admission medications   Medication Sig Start Date End Date Taking? Authorizing Provider  acetaminophen  (TYLENOL ) 500 MG tablet Take 500-1,000 mg by mouth every 6 (six) hours as needed (pain.).    [provider]  ALPRAZolam  (XANAX ) 0.5 MG tablet Take 0.25-1 mg by mouth 2 (two) times daily as needed for anxiety.    [provider]  buPROPion  (WELLBUTRIN  XL) 150 MG 24 hr tablet Take 150 mg by mouth every evening.    [provider]  Cholecalciferol  (VITAMIN D3) 125 MCG (5000 UT) TABS Take 5,000 Units by mouth in the morning.    [provider]  dofetilide  (TIKOSYN ) 500 MCG capsule TAKE 1 CAPSULE BY MOUTH 2 TIMES DAILY. 08/29/23   Terra Fairy PARAS, PA-C  ELIQUIS  5 MG TABS tablet TAKE 1 TABLET BY MOUTH TWICE A DAY 03/08/23   Camnitz, Soyla Lunger, MD  losartan  (COZAAR ) 50 MG tablet Take 1 tablet (50 mg total) by mouth daily. Patient taking differently: Take 50 mg by mouth in the morning. 06/20/23   Aniceto Daphne CROME, NP  methocarbamol  (ROBAXIN ) 500 MG tablet Take 500 mg by mouth every 6 (six) hours as needed. 12/24/23   [provider]  Multiple Vitamins-Minerals (ICAPS AREDS FORMULA PO) Take 1 tablet by mouth in the morning and at bedtime.    [provider]  ondansetron  (ZOFRAN -ODT) 4 MG disintegrating tablet Take  4 mg by mouth every 6 (six) hours as needed. 12/24/23   [provider]  oxyCODONE  (OXY IR/ROXICODONE ) 5 MG immediate release tablet Take 5 mg by mouth every 4 (four) hours as needed. 12/24/23   [provider]  Polyethyl Glycol-Propyl Glycol (SYSTANE) 0.4-0.3 % SOLN Place 1-2 drops into both eyes 3 (three) times daily as needed (dry/irritated eyes.).    [provider]  vitamin B-12 (CYANOCOBALAMIN ) 1000 MCG tablet Take 1,000 mcg by mouth in the morning.    [provider]    Allergies: Benazepril, Adhesive [tape], and Sulfa antibiotics    Review of Systems  All other systems reviewed and are negative.   Updated Vital Signs BP 136/79   Pulse 90   Temp 98.3 F (36.8 C)   Resp 16   SpO2 97%   Physical Exam Vitals and nursing note reviewed.  Constitutional:      Appearance: Normal appearance.  HENT:     Head: Normocephalic.  Neck:     Comments: There is tenderness to palpation in the soft tissues of the upper cervical region.  No bony tenderness or step-off. Pulmonary:     Effort: Pulmonary effort is normal.  Neurological:     Mental Status: She is alert and oriented to person, place, and time.     Comments: The upper extremities have normal strength and  sensation is intact throughout.     (all labs ordered are listed, but only abnormal results are displayed) Labs Reviewed - No data to display  EKG: None  Radiology: No results found.   Procedures   Medications Ordered in the ED  HYDROmorphone  (DILAUDID ) injection 2 mg (has no administration in time range)  predniSONE  (DELTASONE ) tablet 40 mg (has no administration in time range)                                    Medical Decision Making Risk Prescription drug management.   Patient presenting with neck pain as described in the HPI.  I highly suspect a musculoskeletal etiology.  She is feeling markedly improved after receiving a dose of Dilaudid  and prednisone .  She will be  discharged with prednisone  and continued use of her oxycodone  that was prescribed from her prior surgery.     Final diagnoses:  None    ED Discharge Orders     None          Geroldine Berg, MD 02/07/24 [phone]

## 2024-02-07 NOTE — Discharge Instructions (Signed)
 Begin taking prednisone  as prescribed.  Continue taking oxycodone  as previously prescribed as needed for pain.  Follow-up with primary doctor if symptoms persist, and return to the ER if symptoms significantly worsen or change.

## 2024-02-09 DIAGNOSIS — M25562 Pain in left knee: Secondary | ICD-10-CM | POA: Diagnosis not present

## 2024-02-11 ENCOUNTER — Other Ambulatory Visit (HOSPITAL_COMMUNITY): Payer: Self-pay | Admitting: Internal Medicine

## 2024-02-14 DIAGNOSIS — M25562 Pain in left knee: Secondary | ICD-10-CM | POA: Diagnosis not present

## 2024-02-17 DIAGNOSIS — M25562 Pain in left knee: Secondary | ICD-10-CM | POA: Diagnosis not present

## 2024-02-19 ENCOUNTER — Other Ambulatory Visit: Payer: Self-pay | Admitting: Cardiology

## 2024-02-19 DIAGNOSIS — I480 Paroxysmal atrial fibrillation: Secondary | ICD-10-CM

## 2024-02-20 NOTE — Telephone Encounter (Signed)
 Prescription refill request for Eliquis  received. Indication:afib Last office visit:6/25 Scr:1.32  7/25 Age: 68 Weight:123  kg  Prescription refilled

## 2024-02-28 DIAGNOSIS — M25562 Pain in left knee: Secondary | ICD-10-CM | POA: Diagnosis not present

## 2024-03-01 DIAGNOSIS — M25562 Pain in left knee: Secondary | ICD-10-CM | POA: Diagnosis not present

## 2024-03-01 DIAGNOSIS — Z5189 Encounter for other specified aftercare: Secondary | ICD-10-CM | POA: Diagnosis not present

## 2024-03-07 ENCOUNTER — Other Ambulatory Visit (HOSPITAL_COMMUNITY): Payer: Self-pay | Admitting: Internal Medicine

## 2024-03-22 DIAGNOSIS — E559 Vitamin D deficiency, unspecified: Secondary | ICD-10-CM | POA: Diagnosis not present

## 2024-03-22 DIAGNOSIS — R7303 Prediabetes: Secondary | ICD-10-CM | POA: Diagnosis not present

## 2024-03-22 DIAGNOSIS — I480 Paroxysmal atrial fibrillation: Secondary | ICD-10-CM | POA: Diagnosis not present

## 2024-03-22 DIAGNOSIS — F331 Major depressive disorder, recurrent, moderate: Secondary | ICD-10-CM | POA: Diagnosis not present

## 2024-03-22 DIAGNOSIS — I10 Essential (primary) hypertension: Secondary | ICD-10-CM | POA: Diagnosis not present

## 2024-03-22 DIAGNOSIS — Z23 Encounter for immunization: Secondary | ICD-10-CM | POA: Diagnosis not present

## 2024-03-22 DIAGNOSIS — E538 Deficiency of other specified B group vitamins: Secondary | ICD-10-CM | POA: Diagnosis not present

## 2024-03-22 DIAGNOSIS — E782 Mixed hyperlipidemia: Secondary | ICD-10-CM | POA: Diagnosis not present

## 2024-03-22 DIAGNOSIS — G629 Polyneuropathy, unspecified: Secondary | ICD-10-CM | POA: Diagnosis not present

## 2024-03-22 DIAGNOSIS — G4733 Obstructive sleep apnea (adult) (pediatric): Secondary | ICD-10-CM | POA: Diagnosis not present

## 2024-03-22 DIAGNOSIS — Z0000 Encounter for general adult medical examination without abnormal findings: Secondary | ICD-10-CM | POA: Diagnosis not present

## 2024-03-22 NOTE — Progress Notes (Unsigned)
  Electrophysiology Office Note:   Date:  03/23/2024  ID:  Deborah [name], DOB Feb 25, 1956, MRN [id number]  Primary Cardiologist: None Primary Heart Failure: None Electrophysiologist: Will Gladis Norton, MD      History of Present Illness:   Deborah [name] is a 68 y.o. female with h/o AF, HTN, OSA  seen today for routine electrophysiology followup.   Since last being seen in our clinic the patient reports doing well. No concerns with Tikosyn . She has missed one dose in isolation (very unusual). No missed doses of OAC.     She denies chest pain, palpitations, dyspnea, PND, orthopnea, nausea, vomiting, dizziness, syncope, edema, weight gain, or early satiety.   Review of systems complete and found to be negative unless listed in HPI.   EP Information / Studies Reviewed:    EKG is ordered today. Personal review as below.  EKG Interpretation Date/Time:  Friday March 23 2024 09:53:11 EDT Ventricular Rate:  64 PR Interval:  152 QRS Duration:  152 QT Interval:  514 QTC Calculation: 530 R Axis:   60  Text Interpretation: Sinus rhythm with Premature atrial complexes Right bundle branch block Confirmed by Aniceto Jarvis ([id number]) on 03/23/2024 10:05:17 AM    Arrhythmia / AAD / Pertinent EP Studies Atrial Fibrillation  AF Ablation 12/04/2015, 01/25/2018, 04/11/2020 DCCV 06/03/20  Propafenone  > failed  DCCV 03/05/2022  Tikosyn  > initiated 04/2023    Risk Assessment/Calculations:    CHA2DS2-VASc Score = 2   This indicates a 2.2% annual risk of stroke. The patient's score is based upon: CHF History: 0 HTN History: 0 Diabetes History: 0 Stroke History: 0 Vascular Disease History: 0 Age Score: 1 Gender Score: 1              Physical Exam:   VS:  BP 126/70 (BP Location: Left Arm, Patient Position: Sitting, Cuff Size: Normal)   Pulse 64   Ht 6' 1 (1.854 m)   Wt 270 lb 12.8 oz (122.8 kg)   SpO2 97%   BMI 35.73 kg/m    Wt Readings from Last 3 Encounters:  03/23/24 270 lb 12.8  oz (122.8 kg)  01/17/24 271 lb 2.7 oz (123 kg)  01/11/24 271 lb 2.7 oz (123 kg)     GEN: Well nourished, well developed in no acute distress NECK: No JVD; No carotid bruits CARDIAC: Regular rate and rhythm, no murmurs, rubs, gallops RESPIRATORY:  Clear to auscultation without rales, wheezing or rhonchi  ABDOMEN: Soft, non-tender, non-distended EXTREMITIES:  No edema; No deformity   ASSESSMENT AND PLAN:    Persistent Atrial Fibrillation  High Risk Drug Monitoring: Tikosyn  CHA2DS2-VASc 2, s/p ablation x3, failed propafenone .  -EKG with NSR, QTc in V6 472 ms manually corrected for RBBB  (got 482 ms in V5) -Tikosyn  500 mcg BID -update Tikosyn  labs > BMP, Mg+ -OAC for stroke propylaxis   Secondary Hypercoagulable State  -continue Eliquis  5mg  BID, dose reviewed and appropriate by age / wt   Hypertension  Hx cough on ACE-I -well controlled on current regimen   OSA  -CPAP compliance encouraged   Follow up with Dr. Norton in 3 months  Signed, Jarvis Aniceto, NP-C, AGACNP-BC [facility] - Electrophysiology  03/23/2024, 10:49 AM

## 2024-03-23 ENCOUNTER — Encounter: Payer: Self-pay | Admitting: Pulmonary Disease

## 2024-03-23 ENCOUNTER — Ambulatory Visit: Attending: Pulmonary Disease | Admitting: Pulmonary Disease

## 2024-03-23 VITALS — BP 126/70 | HR 64 | Ht 73.0 in | Wt 270.8 lb

## 2024-03-23 DIAGNOSIS — G4733 Obstructive sleep apnea (adult) (pediatric): Secondary | ICD-10-CM | POA: Diagnosis not present

## 2024-03-23 DIAGNOSIS — I4819 Other persistent atrial fibrillation: Secondary | ICD-10-CM | POA: Diagnosis not present

## 2024-03-23 DIAGNOSIS — D6869 Other thrombophilia: Secondary | ICD-10-CM

## 2024-03-23 DIAGNOSIS — Z5181 Encounter for therapeutic drug level monitoring: Secondary | ICD-10-CM | POA: Diagnosis not present

## 2024-03-23 DIAGNOSIS — Z79899 Other long term (current) drug therapy: Secondary | ICD-10-CM | POA: Diagnosis not present

## 2024-03-23 DIAGNOSIS — I10 Essential (primary) hypertension: Secondary | ICD-10-CM | POA: Diagnosis not present

## 2024-03-23 NOTE — Patient Instructions (Signed)
 Medication Instructions:  Your physician recommends that you continue on your current medications as directed. Please refer to the Current Medication list given to you today.  *If you need a refill on your cardiac medications before your next appointment, please call your pharmacy*  Lab Work: BMET, MAG-TODAY If you have labs (blood work) drawn today and your tests are completely normal, you will receive your results only by: MyChart Message (if you have MyChart) OR A paper copy in the mail If you have any lab test that is abnormal or we need to change your treatment, we will call you to review the results.  Follow-Up: At [facility], you and your health needs are our priority.  As part of our continuing mission to provide you with exceptional heart care, our providers are all part of one team.  This team includes your primary Cardiologist (physician) and Advanced Practice Providers or APPs (Physician Assistants and Nurse Practitioners) who all work together to provide you with the care you need, when you need it.  Your next appointment:   3 month(s)  Provider:   Daphne Barrack, NP

## 2024-03-24 ENCOUNTER — Ambulatory Visit: Payer: Self-pay | Admitting: Pulmonary Disease

## 2024-03-24 LAB — BASIC METABOLIC PANEL WITH GFR
BUN/Creatinine Ratio: 14 (ref 12–28)
BUN: 13 mg/dL (ref 8–27)
CO2: 23 mmol/L (ref 20–29)
Calcium: 9.3 mg/dL (ref 8.7–10.3)
Chloride: 105 mmol/L (ref 96–106)
Creatinine, Ser: 0.95 mg/dL (ref 0.57–1.00)
Glucose: 91 mg/dL (ref 70–99)
Potassium: 4.6 mmol/L (ref 3.5–5.2)
Sodium: 142 mmol/L (ref 134–144)
eGFR: 65 mL/min/1.73 (ref >59)

## 2024-03-24 LAB — MAGNESIUM: Magnesium: 2.3 mg/dL (ref 1.6–2.3)

## 2024-03-26 DIAGNOSIS — G4733 Obstructive sleep apnea (adult) (pediatric): Secondary | ICD-10-CM | POA: Diagnosis not present

## 2024-04-30 ENCOUNTER — Encounter: Payer: Self-pay | Admitting: Radiology

## 2024-06-14 ENCOUNTER — Ambulatory Visit: Attending: Cardiovascular Disease | Admitting: Pulmonary Disease

## 2024-06-14 ENCOUNTER — Encounter: Payer: Self-pay | Admitting: Pulmonary Disease

## 2024-06-14 VITALS — BP 124/78 | HR 98 | Ht 73.0 in | Wt 270.8 lb

## 2024-06-14 DIAGNOSIS — D6869 Other thrombophilia: Secondary | ICD-10-CM | POA: Diagnosis not present

## 2024-06-14 DIAGNOSIS — I10 Essential (primary) hypertension: Secondary | ICD-10-CM | POA: Diagnosis not present

## 2024-06-14 DIAGNOSIS — G4733 Obstructive sleep apnea (adult) (pediatric): Secondary | ICD-10-CM

## 2024-06-14 DIAGNOSIS — I4819 Other persistent atrial fibrillation: Secondary | ICD-10-CM | POA: Diagnosis not present

## 2024-06-14 DIAGNOSIS — Z79899 Other long term (current) drug therapy: Secondary | ICD-10-CM | POA: Diagnosis not present

## 2024-06-14 DIAGNOSIS — Z5181 Encounter for therapeutic drug level monitoring: Secondary | ICD-10-CM | POA: Diagnosis not present

## 2024-06-14 NOTE — Patient Instructions (Signed)
 Medication Instructions:  No medications changes today   *If you need a refill on your cardiac medications before your next appointment, please call your pharmacy*  Lab Work: We will review your electrolytes (potassium and magnesium) for tikosyn  monitoring  If you have labs (blood work) drawn today and your tests are completely normal, you will receive your results only by: MyChart Message (if you have MyChart) OR A paper copy in the mail If you have any lab test that is abnormal or we need to change your treatment, we will call you to review the results.  Testing/Procedures: No testing/procedures were scheduled today  Follow-Up: At [facility] you and your health needs are our priority.  As part of our continuing mission to provide you with exceptional heart care, our providers are all part of one team.  This team includes your primary Cardiologist (physician) and Advanced Practice Providers or APPs (Physician Assistants and Nurse Practitioners) who all work together to provide you with the care you need, when you need it.  Your next appointment:   3 month(s)  Provider:   You may see Will Gladis Norton, MD or one of the following Advanced Practice Providers on your designated Care Team:    Daphne Barrack, NP    We recommend signing up for the patient portal called MyChart.  Sign up information is provided on this After Visit Summary.  MyChart is used to connect with patients for Virtual Visits (Telemedicine).  Patients are able to view lab/test results, encounter notes, upcoming appointments, etc.  Non-urgent messages can be sent to your provider as well.   To learn more about what you can do with MyChart, go to [email or web address].

## 2024-06-14 NOTE — Progress Notes (Signed)
°  Electrophysiology Office Note:   Date:  06/14/2024  ID:  Deborah [name], DOB March 26, 1956, MRN [id number]  Primary Cardiologist: None Primary Heart Failure: None Electrophysiologist: Will Gladis Norton, MD      History of Present Illness:   Deborah [name] is a 68 y.o. female with h/o AF, HTN, OSA seen today for routine electrophysiology followup.   Since last being seen in our clinic the patient reports doing well. She missed one dose of Tikosyn  several months back (noted at last visit). None since. No missed doses of OAC.  She is preparing to go to [place] with her son for Christmas.   She denies chest pain, palpitations, dyspnea, PND, orthopnea, nausea, vomiting, dizziness, syncope, edema, weight gain, or early satiety.   Review of systems complete and found to be negative unless listed in HPI.   EP Information / Studies Reviewed:    EKG is ordered today. Personal review as below.  EKG Interpretation Date/Time:  Thursday June 14 2024 14:47:38 EST Ventricular Rate:  98 PR Interval:  142 QRS Duration:  150 QT Interval:  440 QTC Calculation: 561 R Axis:   105  Text Interpretation: Sinus rhythm with Premature atrial complexes with Abberant conduction Right bundle branch block Confirmed by Aniceto Jarvis ([id number]) on 06/14/2024 3:12:30 PM   Arrhythmia / AAD / Pertinent EP Studies Atrial Fibrillation  AF Ablation 12/04/2015, 01/25/2018, 04/11/2020 DCCV 06/03/20  Propafenone  > failed  DCCV 03/05/2022  Tikosyn  initiated 04/2023 >  Risk Assessment/Calculations:    CHA2DS2-VASc Score = 2   This indicates a 2.2% annual risk of stroke. The patient's score is based upon: CHF History: 0 HTN History: 0 Diabetes History: 0 Stroke History: 0 Vascular Disease History: 0 Age Score: 1 Gender Score: 1              Physical Exam:   VS:  BP 124/78   Pulse 98   Ht 6' 1 (1.854 m)   Wt 270 lb 12.8 oz (122.8 kg)   SpO2 97%   BMI 35.73 kg/m    Wt Readings from Last 3 Encounters:   06/14/24 270 lb 12.8 oz (122.8 kg)  03/23/24 270 lb 12.8 oz (122.8 kg)  01/17/24 271 lb 2.7 oz (123 kg)     GEN: Well nourished, well developed in no acute distress NECK: No JVD; No carotid bruits CARDIAC: Regular rate and rhythm, no murmurs, rubs, gallops RESPIRATORY:  Clear to auscultation without rales, wheezing or rhonchi  ABDOMEN: Soft, non-tender, non-distended EXTREMITIES:  No edema; No deformity   ASSESSMENT AND PLAN:    Persistent Atrial Fibrillation  High Risk Drug Monitoring: Tikosyn  CHA2DS2-VASc 2, s/p ablation x3, failed propafenone . -EKG with NSR, QTc (corrected for RBBB) -continue Tikosyn  500 mcg BID  -update Tikosyn  labs > BMP, Mg+  -OAC for stroke prophylaxis   Secondary Hypercoagulable State  -continue Eliquis  5mg  BID, dose reviewed and appropriate by age / wt   Hypertension  Hx cough with ACE-I  -well controlled on current regimen     Follow up with EP APP in 3 months > plan for Q6 months at next visit / if remains stable   Signed, Jarvis Aniceto, NP-C, AGACNP-BC [facility] - Electrophysiology  06/14/2024, 3:16 PM

## 2024-06-15 ENCOUNTER — Ambulatory Visit: Payer: Self-pay | Admitting: Pulmonary Disease

## 2024-06-15 LAB — BASIC METABOLIC PANEL WITH GFR
BUN/Creatinine Ratio: 14 (ref 12–28)
BUN: 17 mg/dL (ref 8–27)
CO2: 24 mmol/L (ref 20–29)
Calcium: 10.3 mg/dL (ref 8.7–10.3)
Chloride: 103 mmol/L (ref 96–106)
Creatinine, Ser: 1.23 mg/dL — ABNORMAL HIGH (ref 0.57–1.00)
Glucose: 87 mg/dL (ref 70–99)
Potassium: 4.7 mmol/L (ref 3.5–5.2)
Sodium: 141 mmol/L (ref 134–144)
eGFR: 48 mL/min/1.73 — ABNORMAL LOW

## 2024-06-15 LAB — MAGNESIUM: Magnesium: 2 mg/dL (ref 1.6–2.3)

## 2024-06-16 ENCOUNTER — Other Ambulatory Visit: Payer: Self-pay | Admitting: Pulmonary Disease

## 2024-07-07 ENCOUNTER — Other Ambulatory Visit (HOSPITAL_COMMUNITY): Payer: Self-pay | Admitting: Internal Medicine

## 2024-09-18 ENCOUNTER — Ambulatory Visit: Admitting: Pulmonary Disease
